# Patient Record
Sex: Female | Born: 1940
Health system: Southern US, Community
[De-identification: ages and names within clinical notes are randomized; demographics above are authoritative.]

## PROBLEM LIST (undated history)

## (undated) DIAGNOSIS — M8580 Other specified disorders of bone density and structure, unspecified site: Secondary | ICD-10-CM

## (undated) DIAGNOSIS — J309 Allergic rhinitis, unspecified: Secondary | ICD-10-CM

## (undated) DIAGNOSIS — E7800 Pure hypercholesterolemia, unspecified: Secondary | ICD-10-CM

## (undated) DIAGNOSIS — H269 Unspecified cataract: Secondary | ICD-10-CM

## (undated) DIAGNOSIS — Z9889 Other specified postprocedural states: Secondary | ICD-10-CM

## (undated) DIAGNOSIS — R112 Nausea with vomiting, unspecified: Secondary | ICD-10-CM

## (undated) DIAGNOSIS — K219 Gastro-esophageal reflux disease without esophagitis: Secondary | ICD-10-CM

## (undated) DIAGNOSIS — B029 Zoster without complications: Secondary | ICD-10-CM

## (undated) DIAGNOSIS — D279 Benign neoplasm of unspecified ovary: Secondary | ICD-10-CM

## (undated) DIAGNOSIS — M1990 Unspecified osteoarthritis, unspecified site: Secondary | ICD-10-CM

## (undated) HISTORY — DX: Allergic rhinitis, unspecified: J30.9

## (undated) HISTORY — DX: Benign neoplasm of unspecified ovary: D27.9

## (undated) HISTORY — PX: TONSILLECTOMY: SUR1361

## (undated) HISTORY — PX: EYE SURGERY: SHX253

## (undated) HISTORY — DX: Other specified disorders of bone density and structure, unspecified site: M85.80

## (undated) HISTORY — PX: FRACTURE SURGERY: SHX138

## (undated) HISTORY — DX: Zoster without complications: B02.9

## (undated) HISTORY — DX: Unspecified cataract: H26.9

## (undated) HISTORY — DX: Pure hypercholesterolemia, unspecified: E78.00

---

## 2005-02-01 ENCOUNTER — Other Ambulatory Visit: Admission: RE | Admit: 2005-02-01 | Discharge: 2005-02-01 | Payer: Self-pay | Admitting: Obstetrics and Gynecology

## 2005-02-14 ENCOUNTER — Ambulatory Visit: Admission: RE | Admit: 2005-02-14 | Discharge: 2005-02-14 | Payer: Self-pay | Admitting: Gynecologic Oncology

## 2005-02-17 HISTORY — PX: TOTAL ABDOMINAL HYSTERECTOMY W/ BILATERAL SALPINGOOPHORECTOMY: SHX83

## 2005-02-17 HISTORY — PX: TOTAL ABDOMINAL HYSTERECTOMY: SHX209

## 2005-03-07 ENCOUNTER — Inpatient Hospital Stay (HOSPITAL_COMMUNITY): Admission: RE | Admit: 2005-03-07 | Discharge: 2005-03-09 | Payer: Self-pay | Admitting: Obstetrics & Gynecology

## 2005-04-18 ENCOUNTER — Ambulatory Visit: Admission: RE | Admit: 2005-04-18 | Discharge: 2005-04-18 | Payer: Self-pay | Admitting: Gynecology

## 2008-09-02 LAB — HM COLONOSCOPY

## 2008-10-27 LAB — HM DEXA SCAN

## 2011-11-28 DIAGNOSIS — Z1231 Encounter for screening mammogram for malignant neoplasm of breast: Secondary | ICD-10-CM | POA: Diagnosis not present

## 2011-11-28 LAB — HM MAMMOGRAPHY: HM Mammogram: NEGATIVE

## 2011-11-29 ENCOUNTER — Encounter: Payer: Self-pay | Admitting: Internal Medicine

## 2011-12-25 ENCOUNTER — Encounter: Payer: Self-pay | Admitting: Family Medicine

## 2012-01-04 ENCOUNTER — Ambulatory Visit (INDEPENDENT_AMBULATORY_CARE_PROVIDER_SITE_OTHER): Payer: Medicare Other | Admitting: Family Medicine

## 2012-01-04 ENCOUNTER — Encounter: Payer: Self-pay | Admitting: Family Medicine

## 2012-01-04 VITALS — BP 130/76 | HR 60 | Ht 69.0 in | Wt 170.0 lb

## 2012-01-04 DIAGNOSIS — R030 Elevated blood-pressure reading, without diagnosis of hypertension: Secondary | ICD-10-CM

## 2012-01-04 DIAGNOSIS — M8580 Other specified disorders of bone density and structure, unspecified site: Secondary | ICD-10-CM

## 2012-01-04 DIAGNOSIS — Z23 Encounter for immunization: Secondary | ICD-10-CM

## 2012-01-04 DIAGNOSIS — R5381 Other malaise: Secondary | ICD-10-CM | POA: Diagnosis not present

## 2012-01-04 DIAGNOSIS — R319 Hematuria, unspecified: Secondary | ICD-10-CM

## 2012-01-04 DIAGNOSIS — M899 Disorder of bone, unspecified: Secondary | ICD-10-CM

## 2012-01-04 DIAGNOSIS — Z0000 Encounter for general adult medical examination without abnormal findings: Secondary | ICD-10-CM

## 2012-01-04 DIAGNOSIS — R5383 Other fatigue: Secondary | ICD-10-CM

## 2012-01-04 LAB — POCT URINALYSIS DIPSTICK
Bilirubin, UA: NEGATIVE
Nitrite, UA: NEGATIVE
Urobilinogen, UA: NEGATIVE
pH, UA: 8

## 2012-01-04 LAB — CBC WITH DIFFERENTIAL/PLATELET
Basophils Absolute: 0 10*3/uL (ref 0.0–0.1)
Basophils Relative: 1 % (ref 0–1)
Eosinophils Absolute: 0.3 10*3/uL (ref 0.0–0.7)
Eosinophils Relative: 3 % (ref 0–5)
HCT: 38.9 % (ref 36.0–46.0)
Hemoglobin: 13.1 g/dL (ref 12.0–15.0)
MCH: 29.4 pg (ref 26.0–34.0)
MCHC: 33.7 g/dL (ref 30.0–36.0)
MCV: 87.4 fL (ref 78.0–100.0)
Monocytes Absolute: 0.8 10*3/uL (ref 0.1–1.0)
Monocytes Relative: 9 % (ref 3–12)
Neutro Abs: 5.1 10*3/uL (ref 1.7–7.7)
RDW: 13.6 % (ref 11.5–15.5)

## 2012-01-04 LAB — COMPREHENSIVE METABOLIC PANEL
AST: 14 U/L (ref 0–37)
Albumin: 4.3 g/dL (ref 3.5–5.2)
Alkaline Phosphatase: 48 U/L (ref 39–117)
BUN: 13 mg/dL (ref 6–23)
Creat: 0.67 mg/dL (ref 0.50–1.10)
Glucose, Bld: 90 mg/dL (ref 70–99)
Total Bilirubin: 0.5 mg/dL (ref 0.3–1.2)

## 2012-01-04 NOTE — Patient Instructions (Addendum)
HEALTH MAINTENANCE RECOMMENDATIONS:  It is recommended that you get at least 30 minutes of aerobic exercise at least 5 days/week (for weight loss, you may need as much as 60-90 minutes). This can be any activity that gets your heart rate up. This can be divided in 10-15 minute intervals if needed, but try and build up your endurance at least once a week.  Weight bearing exercise is also recommended twice weekly.  Eat a healthy diet with lots of vegetables, fruits and fiber.  "Colorful" foods have a lot of vitamins (ie green vegetables, tomatoes, red peppers, etc).  Limit sweet tea, regular sodas and alcoholic beverages, all of which has a lot of calories and sugar.  Up to 1 alcoholic drink daily may be beneficial for women (unless trying to lose weight, watch sugars).  Drink a lot of water.  Calcium recommendations are 1200-1500 mg daily (1500 mg for postmenopausal women or women without ovaries), and vitamin D 1000 IU daily.  This should be obtained from diet and/or supplements (vitamins), and calcium should not be taken all at once, but in divided doses.  Monthly self breast exams and yearly mammograms for women over the age of 73 is recommended.  Sunscreen of at least SPF 30 should be used on all sun-exposed parts of the skin when outside between the hours of 10 am and 4 pm (not just when at beach or pool, but even with exercise, golf, tennis, and yard work!)  Use a sunscreen that says "broad spectrum" so it covers both UVA and UVB rays, and make sure to reapply every 1-2 hours.  Remember to change the batteries in your smoke detectors when changing your clock times in the spring and fall.  Use your seat belt every time you are in a car, and please drive safely and not be distracted with cell phones and texting while driving.  I believe you need to get your shingles vaccine from a pharmacy--prescription was given to you.  Check with your insurance regarding coverage and where to get.  Please set  up dental visit and eye exam.  Diet for Gastroesophageal Reflux Disease, Adult Reflux (acid reflux) is when acid from your stomach flows up into the esophagus. When acid comes in contact with the esophagus, the acid causes irritation and soreness (inflammation) in the esophagus. When reflux happens often or so severely that it causes damage to the esophagus, it is called gastroesophageal reflux disease (GERD). Nutrition therapy can help ease the discomfort of GERD. FOODS OR DRINKS TO AVOID OR LIMIT  Smoking or chewing tobacco. Nicotine is one of the most potent stimulants to acid production in the gastrointestinal tract.  Caffeinated and decaffeinated coffee and black tea.  Regular or low-calorie carbonated beverages or energy drinks (caffeine-free carbonated beverages are allowed).   Strong spices, such as black pepper, white pepper, red pepper, cayenne, curry powder, and chili powder.  Peppermint or spearmint.  Chocolate.  High-fat foods, including meats and fried foods. Extra added fats including oils, butter, salad dressings, and nuts. Limit these to less than 8 tsp per day.  Fruits and vegetables if they are not tolerated, such as citrus fruits or tomatoes.  Alcohol.  Any food that seems to aggravate your condition. If you have questions regarding your diet, call your caregiver or a registered dietitian. OTHER THINGS THAT MAY HELP GERD INCLUDE:   Eating your meals slowly, in a relaxed setting.  Eating 5 to 6 small meals per day instead of 3 large meals.  Eliminating food for a period of time if it causes distress.  Not lying down until 3 hours after eating a meal.  Keeping the head of your bed raised 6 to 9 inches (15 to 23 cm) by using a foam wedge or blocks under the legs of the bed. Lying flat may make symptoms worse.  Being physically active. Weight loss may be helpful in reducing reflux in overweight or obese adults.  Wear loose fitting clothing EXAMPLE MEAL  PLAN This meal plan is approximately 2,000 calories based on https://www.bernard.org/ meal planning guidelines. Breakfast   cup cooked oatmeal.  1 cup strawberries.  1 cup low-fat milk.  1 oz almonds. Snack  1 cup cucumber slices.  6 oz yogurt (made from low-fat or fat-free milk). Lunch  2 slice whole-wheat bread.  2 oz sliced [place].  2 tsp mayonnaise.  1 cup blueberries.  1 cup snap peas. Snack  6 whole-wheat crackers.  1 oz string cheese. Dinner   cup brown rice.  1 cup mixed veggies.  1 tsp olive oil.  3 oz grilled fish. Document Released: 03/06/2005 Document Revised: 05/29/2011 Document Reviewed: 01/20/2011 [facility] Patient Information 2013 [place], [place].

## 2012-01-04 NOTE — Progress Notes (Signed)
Chief Complaint  Patient presents with  . Annual Exam    annual wellnes exam, pt is fasting. Former patient. Unsure if she would like flu shot. Would like shingles vaccine if possible. Also a few weeks begore retiring she found some blood in her urine.   Joan Morales is a 71 y.o. female who presents to re-establish care, for a complete physical and annual wellness exam.  She has the following concerns:  In July, she had one incidence where she noted blood in the commode after voiding.  There was not associated back pain, abdominal pain, no dysuria, urgency and frequency.  Has had no further blood noted since then. Denies vaginal discharge, spotting, or other concerns.  Health Maintenance: Immunization History  Administered Date(s) Administered  . Pneumococcal Polysaccharide 09/17/2008  . Tdap 04/20/2005   Last Pap smear: s/p hysterectomy Last mammogram: 11/2011 Last colonoscopy: 2010 Last DEXA: 05/2005, T-2.0 at [facility] Dentist: years ago Ophtho: years ago Exercise: None  AWV: Has no other physicians that she sees. Has healthcare power of attorney and living will. See depression and FAST questionnaires in chart (normal).  Past Medical History  Diagnosis Date  . Allergic rhinitis, cause unspecified   . Ovarian teratoma     left, removed 12/06    Past Surgical History  Procedure Date  . Total abdominal hysterectomy w/ bilateral salpingoophorectomy 12/06    L ovarian teratoma  . Tonsillectomy child    History   Social History  . Marital Status: Married    Spouse Name: N/A    Number of Children: 1  . Years of Education: N/A   Occupational History  . retired    Social History Main Topics  . Smoking status: Never Smoker   . Smokeless tobacco: Never Used  . Alcohol Use: No  . Drug Use: No  . Sexually Active: Not Currently -- Female partner(s)     husband with impotence issues   Other Topics Concern  . Not on file   Social History Narrative   Married, lives with  husband.  Son lives in [place], [place].  2 grandchildren.  Retired recently Administrator, [employer]).    Family History  Problem Relation Age of Onset  . Heart disease Mother   . COPD Father   . Fibromyalgia Sister   . Diabetes Paternal Aunt   . Breast cancer Maternal Grandmother     50's    Current outpatient prescriptions:Ascorbic Acid (VITAMIN C) 1000 MG tablet, Take 1,000 mg by mouth daily., Disp: , Rfl: ;  Calcium Carbonate-Vitamin D (RA CALCIUM PLUS VITAMIN D) 600-400 MG-UNIT per tablet, Take 2 tablets by mouth daily., Disp: , Rfl: ;  Glucosamine HCl 1000 MG TABS, Take 1,000 mg by mouth 2 (two) times daily., Disp: , Rfl: ;  Omega-3 Fatty Acids (FISH OIL) 1200 MG CAPS, Take 2,400 mg by mouth daily., Disp: , Rfl:  vitamin E 400 UNIT capsule, Take 400 Units by mouth daily., Disp: , Rfl:   Allergies  Allergen Reactions  . Erythromycin Swelling   ROS: The patient denies anorexia, fever, weight changes, headaches,  vision changes (wears glasses with driving--had trouble seeing chart today though), decreased hearing, ear pain, sore throat, breast concerns, chest pain, palpitations, dizziness, syncope, dyspnea on exertion, cough, swelling, nausea, vomiting, diarrhea, constipation, abdominal pain, melena, hematochezia, incontinence, dysuria, vaginal bleeding, discharge, odor or itch, genital lesions, joint pains, numbness, tingling, weakness, tremor, suspicious skin lesions, depression, anxiety, abnormal bleeding/bruising, or enlarged lymph nodes. Mild seasonal allergies, relieved by  chlortrimeton as needed. Daily heartburn, uses pepcid as needed.  PHYSICAL EXAM: BP 130/76  Pulse 60  Ht 5\' 9"  (1.753 m)  Wt 170 lb (77.111 kg)  BMI 25.10 kg/m2 (initial BP was 148/80) General Appearance:    Alert, cooperative, no distress, appears stated age  Head:    Normocephalic, without obvious abnormality, atraumatic  Eyes:    PERRL, conjunctiva/corneas clear, EOM's intact, fundi    benign  Ears:    Normal  TM's and external ear canals  Nose:   Nares normal, mucosa mild-mod edematousl, no drainage or sinus tenderness  Throat:   Lips, mucosa, and tongue normal; teeth and gums normal  Neck:   Supple, no lymphadenopathy;  thyroid:  no   enlargement/tenderness/nodules; no carotid   bruit or JVD  Back:    Spine nontender, no curvature, ROM normal, no CVA     tenderness  Lungs:     Clear to auscultation bilaterally without wheezes, rales or     ronchi; respirations unlabored  Chest Wall:    No tenderness or deformity   Heart:    Regular rate and rhythm, S1 and S2 normal, no murmur, rub   or gallop  Breast Exam:    No tenderness, masses, or nipple discharge or inversion.      No axillary lymphadenopathy  Abdomen:     Soft, non-tender, nondistended, normoactive bowel sounds,    no masses, no hepatosplenomegaly  Genitalia:    Normal external genitalia without lesions, mild atrophic changes.  BUS and vagina normal; bimanual exam normal--no pelvic masses appreciable, no tenderness. Pap not  performed  Rectal:    Normal tone, no masses or tenderness; guaiac negative stool  Extremities:   No clubbing, cyanosis or edema. Bilateral bunions, hammertoes and callouses.  Pulses:   2+ and symmetric all extremities  Skin:   Skin color, texture, turgor normal, no rashes or lesions  Lymph nodes:   Cervical, supraclavicular, and axillary nodes normal  Neurologic:   CNII-XII intact, normal strength, sensation and gait; reflexes 2+ and symmetric throughout          Psych:   Normal mood, affect, hygiene and grooming.     ASSESSMENT/PLAN: 1. Medicare annual wellness visit, initial  POCT Urinalysis Dipstick, Visual acuity screening  2. Hematuria  Urine culture, CBC with Differential, Comprehensive metabolic panel  3. Other malaise and fatigue  TSH  4. Elevated BP  Comprehensive metabolic panel   improved on repeat testing  5. Osteopenia     Osteopenia--due for DEXA at [facility]  Episode of hematuria in July.  Trace  blood and 1+ leuks on dip--send for culture.  Asymptomatic now.  GERD--diet and precautions reviewed.  She doesn't want to take meds.  Discussed how to take meds prn, PRIOR to the spicy meal.  AWV--reviewed code status (full code, doesn't desire prolonged IV fluids or nutrition).  End of life issues reviewed.  See copy of handout given patient, regarding covered services.  Discussed monthly self breast exams and yearly mammograms after the age of 66; at least 30 minutes of aerobic activity at least 5 days/week; proper sunscreen use reviewed; healthy diet, including goals of calcium and vitamin D intake and alcohol recommendations (less than or equal to 1 drink/day) reviewed; regular seatbelt use; changing batteries in smoke detectors.  Immunization recommendations discussed (see below).  Colonoscopy recommendations reviewed, UTD  Encouraged dental and ophtho exams and discussed why these are important for her to schedule.  zostavax prescription given.  Risks/benefits reviewed. Flu  shot recommended, declined

## 2012-01-05 ENCOUNTER — Encounter: Payer: Self-pay | Admitting: Family Medicine

## 2012-01-07 LAB — URINE CULTURE

## 2012-01-08 ENCOUNTER — Other Ambulatory Visit: Payer: Self-pay | Admitting: *Deleted

## 2012-01-08 MED ORDER — NITROFURANTOIN MONOHYD MACRO 100 MG PO CAPS: 100.0000 mg | ORAL_CAPSULE | Freq: Two times a day (BID) | ORAL | Status: DC

## 2012-01-29 ENCOUNTER — Encounter: Payer: Self-pay | Admitting: *Deleted

## 2012-01-29 DIAGNOSIS — M810 Age-related osteoporosis without current pathological fracture: Secondary | ICD-10-CM | POA: Diagnosis not present

## 2012-01-29 LAB — HM DEXA SCAN

## 2012-02-07 ENCOUNTER — Encounter: Payer: Self-pay | Admitting: Family Medicine

## 2012-02-26 ENCOUNTER — Ambulatory Visit (INDEPENDENT_AMBULATORY_CARE_PROVIDER_SITE_OTHER): Payer: Medicare Other | Admitting: Family Medicine

## 2012-02-26 ENCOUNTER — Encounter: Payer: Self-pay | Admitting: Family Medicine

## 2012-02-26 VITALS — BP 128/70 | HR 72 | Ht 69.0 in | Wt 178.0 lb

## 2012-02-26 DIAGNOSIS — M949 Disorder of cartilage, unspecified: Secondary | ICD-10-CM | POA: Diagnosis not present

## 2012-02-26 DIAGNOSIS — M8580 Other specified disorders of bone density and structure, unspecified site: Secondary | ICD-10-CM

## 2012-02-26 MED ORDER — INFLUENZA VIRUS VACC SPLIT PF IM SUSP: 0.5000 mL | Freq: Once | INTRAMUSCULAR | Status: DC

## 2012-02-26 MED ORDER — ALENDRONATE SODIUM 70 MG PO TABS: 70.0000 mg | ORAL_TABLET | ORAL | Status: DC

## 2012-02-26 NOTE — Patient Instructions (Addendum)
Take alendronate once weekly as directed.  Call if you develop chest pain, trouble swallowing, jaw pain, or other problems or side effects.  Continue to take your calcium and vitamin D, and get regular weight-bearing exercise  Osteoporosis Throughout your life, your body breaks down old bone and replaces it with new bone. As you get older, your body does not replace bone as quickly as it breaks it down. By the age of 30 years, most people begin to gradually lose bone because of the imbalance between bone loss and replacement. Some people lose more bone than others. Bone loss beyond a specified normal degree is considered osteoporosis.  Osteoporosis affects the strength and durability of your bones. The inside of the ends of your bones and your flat bones, like the bones of your pelvis, look like honeycomb, filled with tiny open spaces. As bone loss occurs, your bones become less dense. This means that the open spaces inside your bones become bigger and the walls between these spaces become thinner. This makes your bones weaker. Bones of a person with osteoporosis can become so weak that they can break (fracture) during minor accidents, such as a simple fall. CAUSES  The following factors have been associated with the development of osteoporosis:  Smoking.  Drinking more than 2 alcoholic drinks several days per week.  Long-term use of certain medicines:  Corticosteroids.  Chemotherapy medicines.  Thyroid medicines.  Antiepileptic medicines.  Gonadal hormone suppression medicine.  Immunosuppression medicine.  Being underweight.  Lack of physical activity.  Lack of exposure to the sun. This can lead to vitamin D deficiency.  Certain medical conditions:  Certain inflammatory bowel diseases, such as Crohn's disease and ulcerative colitis.  Diabetes.  Hyperthyroidism.  Hyperparathyroidism. RISK FACTORS Anyone can develop osteoporosis. However, the following factors can increase  your risk of developing osteoporosis:  Gender Women are at higher risk than men.  Age Being older than 50 years increases your risk.  Ethnicity White and Asian people have an increased risk.  Weight Being extremely underweight can increase your risk of osteoporosis.  Family history of osteoporosis Having a family member who has developed osteoporosis can increase your risk. SYMPTOMS  Usually, people with osteoporosis have no symptoms.  DIAGNOSIS  Signs during a physical exam that may prompt your caregiver to suspect osteoporosis include:  Decreased height. This is usually caused by the compression of the bones that form your spine (vertebrae) because they have weakened and become fractured.  A curving or rounding of the upper back (kyphosis). To confirm signs of osteoporosis, your caregiver may request a procedure that uses 2 low-dose X-ray beams with different levels of energy to measure your bone mineral density (dual-energy X-ray absorptiometry [DXA]). Also, your caregiver may check your level of vitamin D. TREATMENT  The goal of osteoporosis treatment is to strengthen bones in order to decrease the risk of bone fractures. There are different types of medicines available to help achieve this goal. Some of these medicines work by slowing the processes of bone loss. Some medicines work by increasing bone density. Treatment also involves making sure that your levels of calcium and vitamin D are adequate. PREVENTION  There are things you can do to help prevent osteoporosis. Adequate intake of calcium and vitamin D can help you achieve optimal bone mineral density. Regular exercise can also help, especially resistance and high-impact activities. If you smoke, quitting smoking is an important part of osteoporosis prevention. MAKE SURE YOU:  Understand these instructions.  Will watch  your condition.  Will get help right away if you are not doing well or get worse. Document Released:  12/14/2004 Document Revised: 05/29/2011 Document Reviewed: 02/18/2011 [facility] Patient Information 2013 [place], [place].

## 2012-02-26 NOTE — Addendum Note (Signed)
Addended by: Debbrah Alar F on: 02/26/2012 01:42 PM   Modules accepted: Orders

## 2012-02-26 NOTE — Progress Notes (Signed)
Chief Complaint  Patient presents with  . Follow-up    appt to discuss DEXA results.   HPI:  Patient recently had DEXA scan, showing T-2.4 with elevated FRAX score, putting her at increased risk for fracture (for both hip, and major osteoporotic fracture).  Chart was reviewed, and there was a decline in bone density in hip from prior study.  She had a normal vitamin D level in 2010, and supplementation has remained the same since that visit.  Past Medical History  Diagnosis Date  . Allergic rhinitis, cause unspecified   . Ovarian teratoma     left, removed 12/06  . Osteopenia   . Pure hypercholesterolemia    Past Surgical History  Procedure Date  . Total abdominal hysterectomy w/ bilateral salpingoophorectomy 12/06    L ovarian teratoma  . Tonsillectomy child   History   Social History  . Marital Status: Married    Spouse Name: N/A    Number of Children: 1  . Years of Education: N/A   Occupational History  . retired    Social History Main Topics  . Smoking status: Never Smoker   . Smokeless tobacco: Never Used  . Alcohol Use: No  . Drug Use: No  . Sexually Active: Not Currently -- Female partner(s)     Comment: husband with impotence issues   Other Topics Concern  . Not on file   Social History Narrative   Married, lives with husband.  Son lives in [place], [place].  2 grandchildren.  Retired recently Administrator, [employer]).   Current Outpatient Prescriptions on File Prior to Visit  Medication Sig Dispense Refill  . Ascorbic Acid (VITAMIN C) 1000 MG tablet Take 1,000 mg by mouth daily.      . Calcium Carbonate-Vitamin D (RA CALCIUM PLUS VITAMIN D) 600-400 MG-UNIT per tablet Take 2 tablets by mouth daily.      . Glucosamine HCl 1000 MG TABS Take 1,000 mg by mouth 2 (two) times daily.      . Omega-3 Fatty Acids (FISH OIL) 1200 MG CAPS Take 2,400 mg by mouth daily.      . vitamin E 400 UNIT capsule Take 400 Units by mouth daily.      . influenza  inactive virus vaccine  (FLUZONE/FLUARIX) injection Inject 0.5 mLs into the muscle once.  0.25 mL  0    Allergies  Allergen Reactions  . Erythromycin Swelling   ROS:  Denies dysphagia, chest pain, reflux, jaw pain.  Denies hot flashes, leg cramps.  Denies back/hip pain.  Denies fevers, URI symptoms, chest pain, shortness of breath, cough, bleeding/bruising or other concerns.  PHYSICAL EXAM: BP 128/70  Pulse 72  Ht 5\' 9"  (1.753 m)  Wt 178 lb (80.74 kg)  BMI 26.29 kg/m2 Well developed, pleasant female in no distress Remainder of visit was limited to discussion  ASSESSMENT/PLAN: 1. Osteopenia  alendronate (FOSAMAX) 70 MG tablet    Risks and side effects of bisphosphonates, Evista and miacalcin reviewed.  Start with alendronate.  If causing GI side effects which last a few days, consider changing to monthly.  Continue calcium and vitamin D supplementation, as well as weight-bearing exercise.  Repeat DEXA 2 years.  Return sooner if having side effect/problems with medications. All questions were answered.  Visit was 25 minutes, all of which was spent counseling, and reviewing results

## 2012-10-25 ENCOUNTER — Encounter: Payer: Self-pay | Admitting: Medical

## 2012-10-25 ENCOUNTER — Ambulatory Visit (INDEPENDENT_AMBULATORY_CARE_PROVIDER_SITE_OTHER): Payer: Medicare Other | Admitting: Medical

## 2012-10-25 VITALS — BP 130/80 | HR 64 | Temp 98.1°F | Resp 16 | Wt 166.0 lb

## 2012-10-25 DIAGNOSIS — R300 Dysuria: Secondary | ICD-10-CM | POA: Diagnosis not present

## 2012-10-25 DIAGNOSIS — N390 Urinary tract infection, site not specified: Secondary | ICD-10-CM | POA: Diagnosis not present

## 2012-10-25 LAB — POCT URINALYSIS DIPSTICK
Bilirubin, UA: NEGATIVE
Glucose, UA: NEGATIVE
Nitrite, UA: POSITIVE
Urobilinogen, UA: NEGATIVE

## 2012-10-25 MED ORDER — NITROFURANTOIN MONOHYD MACRO 100 MG PO CAPS: 100.0000 mg | ORAL_CAPSULE | Freq: Two times a day (BID) | ORAL | Status: DC

## 2012-10-25 NOTE — Progress Notes (Signed)
Subjective  Joan Morales is a 72 y.o. female who complains of pain in the lower abdomen. She has had symptoms for 1 day. Patient also complains of urgency, pelvic pain. Patient denies back pain, fever, vaginal discharge and denies hematuria, cloudy or odorous urine. Patient does not have a history of recurrent UTI.  Has had some UTI in the past.  Patient does not have a history of pyelonephritis.   ROS as in subjective   Objective:    Filed Vitals:   10/25/12 1131  BP: 130/80  Pulse: 64  Temp: 98.1 F (36.7 C)  Resp: 16    General appearance: alert, no distress, WD/WN, female Abdomen: +bs, soft, non tender, non distended, no masses, no hepatomegaly, no splenomegaly Back: no CVA tenderness     Assessment and Plan:   Encounter Diagnoses  Name Primary?  . UTI (urinary tract infection) Yes  . Dysuria    Given the urinalysis results, will begin Macrobid empirically.   Urine culture sent.   Reviewed urine culture from 12/2011 which showed sensitive to macrobid, but resistant to cipro and bactrim.  F/u pending culture.   Discussed hydration, hygiene.

## 2012-10-25 NOTE — Patient Instructions (Signed)
Urinary Tract Infection  Urinary tract infections (UTIs) can develop anywhere along your urinary tract. Your urinary tract is your body's drainage system for removing wastes and extra water. Your urinary tract includes two kidneys, two ureters, a bladder, and a urethra. Your kidneys are a pair of bean-shaped organs. Each kidney is about the size of your fist. They are located below your ribs, one on each side of your spine.  CAUSES  Infections are caused by microbes, which are microscopic organisms, including fungi, viruses, and bacteria. These organisms are so small that they can only be seen through a microscope. Bacteria are the microbes that most commonly cause UTIs.  SYMPTOMS   Symptoms of UTIs may vary by age and gender of the patient and by the location of the infection. Symptoms in young women typically include a frequent and intense urge to urinate and a painful, burning feeling in the bladder or urethra during urination. Older women and men are more likely to be tired, shaky, and weak and have muscle aches and abdominal pain. A fever may mean the infection is in your kidneys. Other symptoms of a kidney infection include pain in your back or sides below the ribs, nausea, and vomiting.  DIAGNOSIS  To diagnose a UTI, your caregiver will ask you about your symptoms. Your caregiver also will ask to provide a urine sample. The urine sample will be tested for bacteria and white blood cells. White blood cells are made by your body to help fight infection.  TREATMENT   Typically, UTIs can be treated with medication. Because most UTIs are caused by a bacterial infection, they usually can be treated with the use of antibiotics. The choice of antibiotic and length of treatment depend on your symptoms and the type of bacteria causing your infection.  HOME CARE INSTRUCTIONS   If you were prescribed antibiotics, take them exactly as your caregiver instructs you. Finish the medication even if you feel better after you  have only taken some of the medication.   Drink enough water and fluids to keep your urine clear or pale yellow.   Avoid caffeine, tea, and carbonated beverages. They tend to irritate your bladder.   Empty your bladder often. Avoid holding urine for long periods of time.   Empty your bladder before and after sexual intercourse.   After a bowel movement, women should cleanse from front to back. Use each tissue only once.  SEEK MEDICAL CARE IF:    You have back pain.   You develop a fever.   Your symptoms do not begin to resolve within 3 days.  SEEK IMMEDIATE MEDICAL CARE IF:    You have severe back pain or lower abdominal pain.   You develop chills.   You have nausea or vomiting.   You have continued burning or discomfort with urination.  MAKE SURE YOU:    Understand these instructions.   Will watch your condition.   Will get help right away if you are not doing well or get worse.  Document Released: 12/14/2004 Document Revised: 09/05/2011 Document Reviewed: 04/14/2011  [employer] Patient Information 2014 [employer].

## 2012-10-29 LAB — URINE CULTURE: Colony Count: >=100000

## 2013-01-15 ENCOUNTER — Telehealth: Payer: Self-pay | Admitting: Family Medicine

## 2013-01-15 ENCOUNTER — Other Ambulatory Visit: Payer: Self-pay | Admitting: *Deleted

## 2013-01-15 DIAGNOSIS — M8580 Other specified disorders of bone density and structure, unspecified site: Secondary | ICD-10-CM

## 2013-01-15 MED ORDER — ALENDRONATE SODIUM 70 MG PO TABS: 70.0000 mg | ORAL_TABLET | ORAL | Status: DC

## 2013-01-15 NOTE — Telephone Encounter (Signed)
Pt called and scheduled a cpe needs refill on fosamax sent to [facility] on [address]. She has two pills left but will need before cpe.

## 2013-02-12 NOTE — Telephone Encounter (Signed)
02/12/2013 °

## 2013-04-29 ENCOUNTER — Ambulatory Visit (INDEPENDENT_AMBULATORY_CARE_PROVIDER_SITE_OTHER): Payer: Medicare Other | Admitting: Medical

## 2013-04-29 ENCOUNTER — Encounter: Payer: Self-pay | Admitting: Medical

## 2013-04-29 VITALS — BP 130/80 | HR 58 | Temp 98.7°F | Resp 14 | Wt 165.0 lb

## 2013-04-29 DIAGNOSIS — R05 Cough: Secondary | ICD-10-CM

## 2013-04-29 DIAGNOSIS — R059 Cough, unspecified: Secondary | ICD-10-CM | POA: Diagnosis not present

## 2013-04-29 DIAGNOSIS — B9789 Other viral agents as the cause of diseases classified elsewhere: Secondary | ICD-10-CM

## 2013-04-29 DIAGNOSIS — J988 Other specified respiratory disorders: Secondary | ICD-10-CM

## 2013-04-29 NOTE — Progress Notes (Signed)
Subjective:  Joan Morales is a 73 y.o. female who presents for illness.  She reports 3 day history of cough, chest congestion, some nasal drainage, mild sore throat, some phlegm with cough. Denies nausea, vomiting, shortness of breath, wheezing, ear pain, fever, sinus pressure, ear pain.  Treatment to date: OTC Tussin for cough..  Denies sick contacts.  No other aggravating or relieving factors.  No other c/o.  ROS as in subjective.   Objective: Filed Vitals:   04/29/13 1438  BP: 130/80  Pulse: 58  Temp: 98.7 F (37.1 C)  Resp: 14    General appearance: Alert, WD/WN, no distress, mildly ill appearing                             Skin: warm, no rash                           Head: no sinus tenderness                            Eyes: conjunctiva normal, corneas clear, PERRLA                            Ears: pearly TMs, external ear canals normal                          Nose: septum midline, turbinates swollen, with erythema and clear discharge             Mouth/throat: MMM, tongue normal, mild pharyngeal erythema                           Neck: supple, no adenopathy, no thyromegaly, nontender                          Heart: RRR, normal S1, S2, no murmurs                         Lungs: +rhonchi, rattly somewhat, but good effort, no dullness, no wheezing     Assessment: Encounter Diagnoses  Name Primary?  . Cough Yes  . Viral respiratory infection      Plan: At this point symptoms and exam suggest viral respiratory infection . Discussed diagnosis and treatment of URI.  Suggested symptomatic OTC remedies. Nasal saline spray for congestion.  Tylenol or Ibuprofen OTC for fever and malaise.  However, advised if fever, worse symptoms in the next 2-3 days, then call or return . She is agreeable to this plan.

## 2013-04-30 ENCOUNTER — Telehealth: Payer: Self-pay | Admitting: Medical

## 2013-04-30 NOTE — Telephone Encounter (Signed)
Please call  She is worse today, fever 101.2., throat worse. Wants to know what she needs to do or take

## 2013-04-30 NOTE — Telephone Encounter (Signed)
pls find out what her symptoms are today?  Mostly cough and congestion the day I saw her.  Please verify what new or worse symptoms she is having?

## 2013-05-01 NOTE — Telephone Encounter (Signed)
Pt called and stated that her cough is worse and she is now running a fever. Pt uses [facility] on [address].

## 2013-05-02 ENCOUNTER — Other Ambulatory Visit: Payer: Self-pay | Admitting: Medical

## 2013-05-02 ENCOUNTER — Telehealth: Payer: Self-pay | Admitting: Medical

## 2013-05-02 MED ORDER — LEVOFLOXACIN 500 MG PO TABS
500.0000 mg | ORAL_TABLET | Freq: Every day | ORAL | Status: DC
Start: 2013-05-02 — End: 2013-06-04

## 2013-05-02 MED ORDER — BENZONATATE 200 MG PO CAPS: 200.0000 mg | ORAL_CAPSULE | Freq: Three times a day (TID) | ORAL | Status: DC | PRN

## 2013-05-02 NOTE — Telephone Encounter (Signed)
lm

## 2013-05-02 NOTE — Telephone Encounter (Signed)
Begin Levaquin once daily antibiotic, Tessalon Perles for cough prn, use OTC Mucinex DM for cough congestion too, but don't take at the same time as [employer].   The Perles are just for worse cough if needed such as bedtime or coughing spells.  Make sure she is drinking plenty of water.

## 2013-06-04 ENCOUNTER — Encounter: Payer: Self-pay | Admitting: Family Medicine

## 2013-06-04 ENCOUNTER — Ambulatory Visit (INDEPENDENT_AMBULATORY_CARE_PROVIDER_SITE_OTHER): Payer: Medicare Other | Admitting: Family Medicine

## 2013-06-04 VITALS — BP 130/82 | HR 68 | Ht 69.75 in | Wt 164.0 lb

## 2013-06-04 DIAGNOSIS — Z01419 Encounter for gynecological examination (general) (routine) without abnormal findings: Secondary | ICD-10-CM

## 2013-06-04 DIAGNOSIS — M2040 Other hammer toe(s) (acquired), unspecified foot: Secondary | ICD-10-CM

## 2013-06-04 DIAGNOSIS — M21619 Bunion of unspecified foot: Secondary | ICD-10-CM

## 2013-06-04 DIAGNOSIS — E7800 Pure hypercholesterolemia, unspecified: Secondary | ICD-10-CM | POA: Diagnosis not present

## 2013-06-04 DIAGNOSIS — Z0000 Encounter for general adult medical examination without abnormal findings: Secondary | ICD-10-CM | POA: Diagnosis not present

## 2013-06-04 DIAGNOSIS — Z13220 Encounter for screening for lipoid disorders: Secondary | ICD-10-CM

## 2013-06-04 DIAGNOSIS — Z131 Encounter for screening for diabetes mellitus: Secondary | ICD-10-CM | POA: Diagnosis not present

## 2013-06-04 DIAGNOSIS — Z23 Encounter for immunization: Secondary | ICD-10-CM | POA: Diagnosis not present

## 2013-06-04 DIAGNOSIS — M8580 Other specified disorders of bone density and structure, unspecified site: Secondary | ICD-10-CM

## 2013-06-04 LAB — LIPID PANEL
CHOL/HDL RATIO: 3 ratio
CHOLESTEROL: 233 mg/dL — AB (ref 0–200)
HDL: 78 mg/dL (ref >39)
LDL Cholesterol: 137 mg/dL — ABNORMAL HIGH (ref 0–99)
Triglycerides: 90 mg/dL (ref <150)
VLDL: 18 mg/dL (ref 0–40)

## 2013-06-04 LAB — GLUCOSE, RANDOM: Glucose, Bld: 94 mg/dL (ref 70–99)

## 2013-06-04 MED ORDER — ALENDRONATE SODIUM 70 MG PO TABS: 70.0000 mg | ORAL_TABLET | ORAL | Status: DC

## 2013-06-04 NOTE — Patient Instructions (Signed)
  HEALTH MAINTENANCE RECOMMENDATIONS:  It is recommended that you get at least 30 minutes of aerobic exercise at least 5 days/week (for weight loss, you may need as much as 60-90 minutes). This can be any activity that gets your heart rate up. This can be divided in 10-15 minute intervals if needed, but try and build up your endurance at least once a week.  Weight bearing exercise is also recommended twice weekly.  Eat a healthy diet with lots of vegetables, fruits and fiber.  "Colorful" foods have a lot of vitamins (ie green vegetables, tomatoes, red peppers, etc).  Limit sweet tea, regular sodas and alcoholic beverages, all of which has a lot of calories and sugar.  Up to 1 alcoholic drink daily may be beneficial for women (unless trying to lose weight, watch sugars).  Drink a lot of water.  Calcium recommendations are 1200-1500 mg daily (1500 mg for postmenopausal women or women without ovaries), and vitamin D 1000 IU daily.  This should be obtained from diet and/or supplements (vitamins), and calcium should not be taken all at once, but in divided doses.  Monthly self breast exams and yearly mammograms for women over the age of 20 is recommended.  Sunscreen of at least SPF 30 should be used on all sun-exposed parts of the skin when outside between the hours of 10 am and 4 pm (not just when at beach or pool, but even with exercise, golf, tennis, and yard work!)  Use a sunscreen that says "broad spectrum" so it covers both UVA and UVB rays, and make sure to reapply every 1-2 hours.  Remember to change the batteries in your smoke detectors when changing your clock times in the spring and fall.  Use your seat belt every time you are in a car, and please drive safely and not be distracted with cell phones and texting while driving.   Please schedule routine eye exam. Bone density is due in November.

## 2013-06-04 NOTE — Progress Notes (Signed)
Chief Complaint  Patient presents with  . Med check plus    fasting med check plus with pelvic exam. Does have some left sided abdominal pain from time to time that has her concerned. Also has some heartburn occasionally.    Joan Morales is a 73 y.o. female who presents for a med check and annual wellness visit.  She has no specific complaints.  Osteopenia:  She has been on fosamax since 02/2012.  She denies any significant side effects--just occasional heartburn which she thinks may be diet related rather than from medications.  Denies dysphagia.  Occasional pain in her left abdomen, very short-lived (reminiscent of when she had cyst).  Immunization History  Administered Date(s) Administered  . Influenza Split 01/04/2012  . Pneumococcal Polysaccharide-23 09/17/2008  . Tdap 05/07/2005   Last Pap smear: s/p hysterectomy  Last mammogram: 11/2011, scheduled for tomorrow Last colonoscopy: 2010  Last DEXA: 01/2012  Dentist: has been going for routine cleanings in the last year Ophtho: years ago  Exercise: she started walking around the house 2-3x/week  AWV:  Other doctors in her care: Dermatologist (Dr. Jarome Matin or Rolm Bookbinder) Dentist:  Dr. Moss Mc  Has healthcare power of attorney and living will.  See depression and FAST questionnaires in chart (normal).   Past Medical History  Diagnosis Date  . Allergic rhinitis, cause unspecified   . Ovarian teratoma     left, removed 12/06  . Osteopenia     fosamax started 01/2012  . Pure hypercholesterolemia     Past Surgical History  Procedure Laterality Date  . Total abdominal hysterectomy w/ bilateral salpingoophorectomy  12/06    L ovarian teratoma  . Tonsillectomy  child    History   Social History  . Marital Status: Married    Spouse Name: N/A    Number of Children: 1  . Years of Education: N/A   Occupational History  . retired    Social History Main Topics  . Smoking status: Never Smoker   . Smokeless  tobacco: Never Used  . Alcohol Use: No  . Drug Use: No  . Sexual Activity: Not Currently    Partners: Male     Comment: husband with impotence issues   Other Topics Concern  . Not on file   Social History Narrative   Married, lives with husband.  Son lives in [place], [place].  2 grandchildren.  Retired recently Pensions consultant).    Family History  Problem Relation Age of Onset  . Heart disease Mother   . COPD Father     smoker  . Fibromyalgia Sister   . Irritable bowel syndrome Sister   . Diabetes Paternal Aunt   . Breast cancer Maternal Grandmother     50's  . Colon cancer Neg Hx    Outpatient Encounter Prescriptions as of 06/04/2013  Medication Sig  . alendronate (FOSAMAX) 70 MG tablet Take 1 tablet (70 mg total) by mouth every 7 (seven) days. Take with a full glass of water on an empty stomach.  . Ascorbic Acid (VITAMIN C) 1000 MG tablet Take 1,000 mg by mouth daily.  . Calcium Carbonate-Vitamin D (RA CALCIUM PLUS VITAMIN D) 600-400 MG-UNIT per tablet Take 1 tablet by mouth 2 (two) times daily.   . Glucosamine HCl 1000 MG TABS Take 1,000 mg by mouth 2 (two) times daily.  . Omega-3 Fatty Acids (FISH OIL) 1200 MG CAPS Take 2,400 mg by mouth daily.  . vitamin E 400 UNIT capsule Take 400  Units by mouth daily.  . [DISCONTINUED] alendronate (FOSAMAX) 70 MG tablet Take 1 tablet (70 mg total) by mouth every 7 (seven) days. Take with a full glass of water on an empty stomach.  . [DISCONTINUED] benzonatate (TESSALON) 200 MG capsule Take 1 capsule (200 mg total) by mouth 3 (three) times daily as needed for cough.  . [DISCONTINUED] influenza  inactive virus vaccine (FLUZONE/FLUARIX) injection Inject 0.5 mLs into the muscle once.  . [DISCONTINUED] levofloxacin (LEVAQUIN) 500 MG tablet Take 1 tablet (500 mg total) by mouth daily.    Allergies  Allergen Reactions  . Erythromycin Swelling    ROS: The patient denies anorexia, fever, weight changes, headaches, vision changes, decreased  hearing, ear pain, sore throat, breast concerns, chest pain, palpitations, dizziness, syncope, dyspnea on exertion, cough, swelling, nausea, vomiting, diarrhea, constipation, abdominal pain, melena, hematochezia, incontinence, dysuria, vaginal bleeding, discharge, odor or itch, genital lesions, joint pains, numbness, tingling, weakness, tremor, suspicious skin lesions, depression, anxiety, abnormal bleeding/bruising, or enlarged lymph nodes.  Mild seasonal allergies, relieved by chlortrimeton as needed, hasn't started flaring yet. Occasional heartburn, uses pepcid as needed (less frequent than in the past). Arthritis in hands/fingers, sometimes has pain in right index finger, and sometimes knee pain  PHYSICAL EXAM: BP 130/82  Pulse 68  Ht 5' 9.75" (1.772 m)  Wt 164 lb (74.39 kg)  BMI 23.69 kg/m2  General Appearance:  Alert, cooperative, no distress, appears stated age   Head:  Normocephalic, without obvious abnormality, atraumatic   Eyes:  PERRL, conjunctiva/corneas clear, EOM's intact, fundi  benign   Ears:  Normal TM's and external ear canals   Nose:  Nares normal, mucosa mild-mod edematousl, no drainage or sinus tenderness   Throat:  Lips, mucosa, and tongue normal; teeth and gums normal   Neck:  Supple, no lymphadenopathy; thyroid: no enlargement/tenderness/nodules; no carotid  bruit or JVD   Back:  Spine nontender, no curvature, ROM normal, no CVA tenderness   Lungs:  Clear to auscultation bilaterally without wheezes, rales or ronchi; respirations unlabored   Chest Wall:  No tenderness or deformity   Heart:  Regular rate and rhythm, S1 and S2 normal, no murmur, rub  or gallop   Breast Exam:  No tenderness, masses, or nipple discharge or inversion. No axillary lymphadenopathy   Abdomen:  Soft, non-tender, nondistended, normoactive bowel sounds,  no masses, no hepatosplenomegaly   Genitalia:  Normal external genitalia without lesions, mild atrophic changes. BUS and vagina normal;  bimanual exam normal--no pelvic masses appreciable, no tenderness. Pap not performed   Rectal:  Normal tone, no masses or tenderness; guaiac negative stool   Extremities:  No clubbing, cyanosis or edema. Bilateral bunions, hammertoes and callouses. Onychomycotic, thickened nails. Arthritic deformity to DIP's in hands  Pulses:  2+ and symmetric all extremities   Skin:  Skin color, texture, turgor normal, no rashes or lesions   Lymph nodes:  Cervical, supraclavicular, and axillary nodes normal   Neurologic:  CNII-XII intact, normal strength, sensation and gait; reflexes 2+ and symmetric throughout          Psych: Normal mood, affect, hygiene and grooming.   ASSESSMENT/PLAN:  Medicare annual wellness visit, subsequent - Plan: Lipid panel, Glucose, random  Need for prophylactic vaccination against Streptococcus pneumoniae (pneumococcus) - Plan: Pneumococcal conjugate vaccine 13-valent  Screening for diabetes mellitus - Plan: Glucose, random  Screening for lipoid disorders - Plan: Lipid panel  Osteopenia - Plan: HM DEXA SCAN, alendronate (FOSAMAX) 70 MG tablet, DG Bone Density  Bunion  Hammertoe   Bunions and Hammertoes: Flat and wide shoes recommended.  Do not recommend surgery since asymptomatic, but can consider podiatry consult. Onychomycosis--asymptomatic, treatment not recommended (other than clipping toenails)  Osteopenia:  Continue fosamax;DEXA due again in November 2015  Prevnar-13 today Prescription given again today for zostavax (to be gotten after 4/18).  Risks/side effects reviewed.  Encouraged her to schedule routine eye exam.  Discussed monthly self breast exams and yearly mammograms after the age of 33; at least 30 minutes of aerobic activity at least 5 days/week; proper sunscreen use reviewed; healthy diet, including goals of calcium and vitamin D intake and alcohol recommendations (less than or equal to 1 drink/day) reviewed; regular seatbelt use; changing batteries  in smoke detectors. Immunization recommendations discussed--high dose flu shots recommended yearly, Prevnar 13 today.  Shingles vaccine recommended, risks reviewed. Colonoscopy recommendations reviewed, UTD.  Hemoccult cards given  MOST form filled out.  Full code, full care, no prolonged IV fluids/tube feeds

## 2013-06-05 ENCOUNTER — Encounter: Payer: Self-pay | Admitting: Family Medicine

## 2013-06-05 ENCOUNTER — Encounter: Payer: Self-pay | Admitting: Internal Medicine

## 2013-06-05 DIAGNOSIS — Z1231 Encounter for screening mammogram for malignant neoplasm of breast: Secondary | ICD-10-CM | POA: Diagnosis not present

## 2013-06-05 LAB — HM MAMMOGRAPHY

## 2013-06-06 DIAGNOSIS — L821 Other seborrheic keratosis: Secondary | ICD-10-CM | POA: Diagnosis not present

## 2013-12-15 ENCOUNTER — Encounter: Payer: Self-pay | Admitting: Family Medicine

## 2013-12-15 ENCOUNTER — Ambulatory Visit (INDEPENDENT_AMBULATORY_CARE_PROVIDER_SITE_OTHER): Payer: Medicare Other | Admitting: Family Medicine

## 2013-12-15 VITALS — BP 128/80 | HR 72 | Temp 97.7°F | Ht 69.75 in | Wt 167.0 lb

## 2013-12-15 DIAGNOSIS — R109 Unspecified abdominal pain: Secondary | ICD-10-CM

## 2013-12-15 DIAGNOSIS — R1032 Left lower quadrant pain: Secondary | ICD-10-CM | POA: Diagnosis not present

## 2013-12-15 DIAGNOSIS — R8290 Unspecified abnormal findings in urine: Secondary | ICD-10-CM

## 2013-12-15 DIAGNOSIS — Z23 Encounter for immunization: Secondary | ICD-10-CM

## 2013-12-15 DIAGNOSIS — R82998 Other abnormal findings in urine: Secondary | ICD-10-CM

## 2013-12-15 DIAGNOSIS — K5730 Diverticulosis of large intestine without perforation or abscess without bleeding: Secondary | ICD-10-CM

## 2013-12-15 LAB — POCT URINALYSIS DIPSTICK
BILIRUBIN UA: NEGATIVE
GLUCOSE UA: NEGATIVE
Ketones, UA: NEGATIVE
NITRITE UA: NEGATIVE
Protein, UA: NEGATIVE
Spec Grav, UA: 1.01
Urobilinogen, UA: NEGATIVE
pH, UA: 7

## 2013-12-15 NOTE — Patient Instructions (Addendum)
High fiber diet is recommended. Consider trying simethicone (Gas-X) as needed for discomfort.  Return if worsening pain, fevers, nausea/vomiting, blood in stool, or any other concerns.   High-Fiber Diet Fiber is found in fruits, vegetables, and grains. A high-fiber diet encourages the addition of more whole grains, legumes, fruits, and vegetables in your diet. The recommended amount of fiber for adult males is 38 g per day. For adult females, it is 25 g per day. Pregnant and lactating women should get 28 g of fiber per day. If you have a digestive or bowel problem, ask your caregiver for advice before adding high-fiber foods to your diet. Eat a variety of high-fiber foods instead of only a select few type of foods.  PURPOSE  To increase stool bulk.  To make bowel movements more regular to prevent constipation.  To lower cholesterol.  To prevent overeating. WHEN IS THIS DIET USED?  It may be used if you have constipation and hemorrhoids.  It may be used if you have uncomplicated diverticulosis (intestine condition) and irritable bowel syndrome.  It may be used if you need help with weight management.  It may be used if you want to add it to your diet as a protective measure against atherosclerosis, diabetes, and cancer. SOURCES OF FIBER  Whole-grain breads and cereals.  Fruits, such as apples, oranges, bananas, berries, prunes, and pears.  Vegetables, such as green peas, carrots, sweet potatoes, beets, broccoli, cabbage, spinach, and artichokes.  Legumes, such split peas, soy, lentils.  Almonds. FIBER CONTENT IN FOODS Starches and Grains / Dietary Fiber (g)  Cheerios, 1 cup / 3 g  Corn Flakes cereal, 1 cup / 0.7 g  Rice crispy treat cereal, 1 cup / 0.3 g  Instant oatmeal (cooked),  cup / 2 g  Frosted wheat cereal, 1 cup / 5.1 g  Brown, long-grain rice (cooked), 1 cup / 3.5 g  White, long-grain rice (cooked), 1 cup / 0.6 g  Enriched macaroni (cooked), 1 cup / 2.5  g Legumes / Dietary Fiber (g)  Baked beans (canned, plain, or vegetarian),  cup / 5.2 g  Kidney beans (canned),  cup / 6.8 g  Pinto beans (cooked),  cup / 5.5 g Breads and Crackers / Dietary Fiber (g)  Plain or honey graham crackers, 2 squares / 0.7 g  Saltine crackers, 3 squares / 0.3 g  Plain, salted pretzels, 10 pieces / 1.8 g  Whole-wheat bread, 1 slice / 1.9 g  White bread, 1 slice / 0.7 g  Raisin bread, 1 slice / 1.2 g  Plain bagel, 3 oz / 2 g  Flour tortilla, 1 oz / 0.9 g  Corn tortilla, 1 small / 1.5 g  Hamburger or hotdog bun, 1 small / 0.9 g Fruits / Dietary Fiber (g)  Apple with skin, 1 medium / 4.4 g  Sweetened applesauce,  cup / 1.5 g  Banana,  medium / 1.5 g  Grapes, 10 grapes / 0.4 g  Orange, 1 small / 2.3 g  Raisin, 1.5 oz / 1.6 g  Melon, 1 cup / 1.4 g Vegetables / Dietary Fiber (g)  Green beans (canned),  cup / 1.3 g  Carrots (cooked),  cup / 2.3 g  Broccoli (cooked),  cup / 2.8 g  Peas (cooked),  cup / 4.4 g  Mashed potatoes,  cup / 1.6 g  Lettuce, 1 cup / 0.5 g  Corn (canned),  cup / 1.6 g  Tomato,  cup / 1.1 g Document Released:  03/06/2005 Document Revised: 09/05/2011 Document Reviewed: 06/08/2011 Exit[employer] Patient Information 2015 [place], [place]. This information is not intended to replace advice given to you by your health care provider. Make sure you discuss any questions you have with your health care provider.  Diverticulosis Diverticulosis is the condition that develops when small pouches (diverticula) form in the wall of your colon. Your colon, or large intestine, is where water is absorbed and stool is formed. The pouches form when the inside layer of your colon pushes through weak spots in the outer layers of your colon. CAUSES  No one knows exactly what causes diverticulosis. RISK FACTORS  Being older than 46. Your risk for this condition increases with age. Diverticulosis is rare in people younger than 40  years. By age 79, almost everyone has it.  Eating a low-fiber diet.  Being frequently constipated.  Being overweight.  Not getting enough exercise.  Smoking.  Taking over-the-counter pain medicines, like aspirin and ibuprofen. SYMPTOMS  Most people with diverticulosis do not have symptoms. DIAGNOSIS  Because diverticulosis often has no symptoms, health care providers often discover the condition during an exam for other colon problems. In many cases, a health care provider will diagnose diverticulosis while using a flexible scope to examine the colon (colonoscopy). TREATMENT  If you have never developed an infection related to diverticulosis, you may not need treatment. If you have had an infection before, treatment may include:  Eating more fruits, vegetables, and grains.  Taking a fiber supplement.  Taking a live bacteria supplement (probiotic).  Taking medicine to relax your colon. HOME CARE INSTRUCTIONS   Drink at least 6-8 glasses of water each day to prevent constipation.  Try not to strain when you have a bowel movement.  Keep all follow-up appointments. If you have had an infection before:  Increase the fiber in your diet as directed by your health care provider or dietitian.  Take a dietary fiber supplement if your health care provider approves.  Only take medicines as directed by your health care provider. SEEK MEDICAL CARE IF:   You have abdominal pain.  You have bloating.  You have cramps.  You have not gone to the bathroom in 3 days. SEEK IMMEDIATE MEDICAL CARE IF:   Your pain gets worse.  Yourbloating becomes very bad.  You have a fever or chills, and your symptoms suddenly get worse.  You begin vomiting.  You have bowel movements that are bloody or black. MAKE SURE YOU:  Understand these instructions.  Will watch your condition.  Will get help right away if you are not doing well or get worse. Document Released: 12/02/2003 Document  Revised: 03/11/2013 Document Reviewed: 01/29/2013 [facility] Patient Information 2015 [place], [place]. This information is not intended to replace advice given to you by your health care provider. Make sure you discuss any questions you have with your health care provider.

## 2013-12-15 NOTE — Progress Notes (Signed)
Chief Complaint  Patient presents with  . Abdominal Pain    lower abdominal pain x 3 weeks. No vomiting or diarrhea. Mostly pressure.    She gets twinges of pain in the left lower abdomen that has been coming and going over the last 3 weeks.  She is also reporting pressure across the whole lower abdomen (she thinks maybe her clothes are too tight since she gained a little weight). Symptoms started 3 weeks ago, and haven't gotten any worse.  Her bowels have changed some--"gooey", a little looser than normal. Denies any bloody or black stools.  Denies any changes in her diet.  She sometimes notes a little stinging discomfort when voiding.  Denies urgency/frequency, incontinence.  Urine sometimes will have an odor (in the mornings, when concentrated); no cloudiness to the urine.  She doesn't get bladder infections frequently, last time was a year ago. She doesn't think this feels the same.  No h/o kidney stones. She denies any change in activity/new activities (less yardwork recently due to back pain). Denies any vaginal discharge, odor, itch, bleeding/spotting.  Last colonoscopy was 2010; noted to have diverticulosis.  Past Medical History  Diagnosis Date  . Allergic rhinitis, cause unspecified   . Ovarian teratoma     left, removed 12/06  . Osteopenia     fosamax started 01/2012  . Pure hypercholesterolemia    Past Surgical History  Procedure Laterality Date  . Total abdominal hysterectomy w/ bilateral salpingoophorectomy  12/06    L ovarian teratoma  . Tonsillectomy  child   History   Social History  . Marital Status: Married    Spouse Name: N/A    Number of Children: 1  . Years of Education: N/A   Occupational History  . retired    Social History Main Topics  . Smoking status: Never Smoker   . Smokeless tobacco: Never Used  . Alcohol Use: No  . Drug Use: No  . Sexual Activity: Not Currently    Partners: Male     Comment: husband with impotence issues   Other Topics  Concern  . Not on file   Social History Narrative   Married, lives with husband.  Son lives in [place], [place].  2 grandchildren.  Retired recently Pensions consultant).   Outpatient Encounter Prescriptions as of 12/15/2013  Medication Sig  . alendronate (FOSAMAX) 70 MG tablet Take 1 tablet (70 mg total) by mouth every 7 (seven) days. Take with a full glass of water on an empty stomach.  . Ascorbic Acid (VITAMIN C) 1000 MG tablet Take 1,000 mg by mouth daily.  . Calcium Carbonate-Vitamin D (RA CALCIUM PLUS VITAMIN D) 600-400 MG-UNIT per tablet Take 1 tablet by mouth 2 (two) times daily.   . Glucosamine HCl 1000 MG TABS Take 1,000 mg by mouth 2 (two) times daily.  . Omega-3 Fatty Acids (FISH OIL) 1200 MG CAPS Take 2,400 mg by mouth daily.  . vitamin E 400 UNIT capsule Take 400 Units by mouth daily.   Allergies  Allergen Reactions  . Erythromycin Swelling    ROS:  Denies fevers, chills, nausea or vomiting.  No chest pain, shortness of breath, headaches, dizziness, URI symptoms. She has been having some sciatica and lower back pain.  No weakness. No bleeding, bruising, rash or other complaints.  PHYSICAL EXAM: BP 128/80  Pulse 72  Temp(Src) 97.7 F (36.5 C) (Tympanic)  Ht 5' 9.75" (1.772 m)  Wt 167 lb (75.751 kg)  BMI 24.12 kg/m2 Well developed, pleasant female,  in no distress. Neck: no lymphadenopathy, thyromegaly or mass Heart: regular rate and rhythm Lungs: clear bilaterally Back: no CVA tenderness Abdomen:  Active bowel sounds. No suprapubic tenderness.  She had slight discomfort in the LLQ that was not reproducible moments later (likely gas moved).  No masses.  No guarding or rebound tenderness.  Urine dip:  2+ leuks, otherwise normal  ASSESSMENT/PLAN:  Abdominal pain, unspecified site - Plan: POCT Urinalysis Dipstick, Urine culture  Need for prophylactic vaccination and inoculation against influenza - Plan: Flu vaccine HIGH DOSE PF (Fluzone Tri High dose)  Abnormal  urinalysis - with some pressure--send for culture to r/o UTI.  not starting meds presumptively - Plan: Urine culture  LLQ abdominal pain - Ddx reviewed.  clinically not c/w diverticulitis. high fiber diet. simethicone prn. return if fevers, worsening pain, blood in stool  Diverticulosis of colon without hemorrhage  Leukocytes noted on urine dip.  No symptoms of infection (other than some pressure, and rare dysuria)--therefore will hold off on presumptive treatment.  Send for culture, and treat if abnormal.  LLQ pain is likely related to gas and/or diverticulosis, vs mild discomfort from adhesions/prior surgery.  No evidence of obstruction or diverticulitis on exam. High fiber diet is recommended.Consider simethicone (Gas-X) as needed for discomfort.  Return if worsening pain, fevers, nausea/vomiting, blood in stool, or any other concerns.

## 2013-12-17 ENCOUNTER — Ambulatory Visit: Payer: Medicare Other | Admitting: Family Medicine

## 2013-12-18 ENCOUNTER — Other Ambulatory Visit: Payer: Self-pay | Admitting: *Deleted

## 2013-12-18 LAB — URINE CULTURE

## 2013-12-18 MED ORDER — NITROFURANTOIN MONOHYD MACRO 100 MG PO CAPS: 100.0000 mg | ORAL_CAPSULE | Freq: Two times a day (BID) | ORAL | Status: DC

## 2013-12-18 NOTE — Telephone Encounter (Signed)
Done

## 2014-01-19 ENCOUNTER — Encounter: Payer: Self-pay | Admitting: Family Medicine

## 2014-02-23 ENCOUNTER — Telehealth: Payer: Self-pay | Admitting: Internal Medicine

## 2014-02-23 DIAGNOSIS — M8580 Other specified disorders of bone density and structure, unspecified site: Secondary | ICD-10-CM

## 2014-02-23 MED ORDER — ALENDRONATE SODIUM 70 MG PO TABS
70.0000 mg | ORAL_TABLET | ORAL | Status: DC
Start: 2014-02-23 — End: 2014-06-04

## 2014-02-23 NOTE — Telephone Encounter (Signed)
Forgot to send this to you

## 2014-02-23 NOTE — Telephone Encounter (Signed)
Refill request for alendronate 70mg  to [facility] to [facility], [address]

## 2014-02-23 NOTE — Telephone Encounter (Signed)
Done

## 2014-02-25 DIAGNOSIS — S52501A Unspecified fracture of the lower end of right radius, initial encounter for closed fracture: Secondary | ICD-10-CM | POA: Diagnosis not present

## 2014-02-26 DIAGNOSIS — G8918 Other acute postprocedural pain: Secondary | ICD-10-CM | POA: Diagnosis not present

## 2014-02-26 DIAGNOSIS — Y929 Unspecified place or not applicable: Secondary | ICD-10-CM | POA: Diagnosis not present

## 2014-02-26 DIAGNOSIS — W19XXXA Unspecified fall, initial encounter: Secondary | ICD-10-CM | POA: Diagnosis not present

## 2014-02-26 DIAGNOSIS — S52501A Unspecified fracture of the lower end of right radius, initial encounter for closed fracture: Secondary | ICD-10-CM | POA: Diagnosis not present

## 2014-02-26 HISTORY — PX: WRIST FRACTURE SURGERY: SHX121

## 2014-03-09 ENCOUNTER — Encounter (INDEPENDENT_AMBULATORY_CARE_PROVIDER_SITE_OTHER): Payer: Medicare Other | Admitting: Ophthalmology

## 2014-03-09 ENCOUNTER — Encounter (HOSPITAL_COMMUNITY): Payer: Self-pay | Admitting: *Deleted

## 2014-03-09 DIAGNOSIS — H338 Other retinal detachments: Secondary | ICD-10-CM

## 2014-03-09 DIAGNOSIS — H2513 Age-related nuclear cataract, bilateral: Secondary | ICD-10-CM | POA: Diagnosis not present

## 2014-03-09 DIAGNOSIS — H33002 Unspecified retinal detachment with retinal break, left eye: Secondary | ICD-10-CM | POA: Diagnosis present

## 2014-03-09 DIAGNOSIS — H43813 Vitreous degeneration, bilateral: Secondary | ICD-10-CM

## 2014-03-09 DIAGNOSIS — S52501D Unspecified fracture of the lower end of right radius, subsequent encounter for closed fracture with routine healing: Secondary | ICD-10-CM | POA: Diagnosis not present

## 2014-03-09 NOTE — H&P (Signed)
Joan Morales is an 73 y.o. female.   Chief Complaint:loss of vision left eye HPI: lost vision left eye yesterday  Past Medical History  Diagnosis Date  . Allergic rhinitis, cause unspecified   . Ovarian teratoma     left, removed 12/06  . Osteopenia     fosamax started 01/2012  . Pure hypercholesterolemia     Past Surgical History  Procedure Laterality Date  . Total abdominal hysterectomy w/ bilateral salpingoophorectomy  12/06    L ovarian teratoma  . Tonsillectomy  child    Family History  Problem Relation Age of Onset  . Heart disease Mother   . COPD Father     smoker  . Fibromyalgia Sister   . Irritable bowel syndrome Sister   . Diabetes Paternal Aunt   . Breast cancer Maternal Grandmother     50's  . Colon cancer Neg Hx    Social History:  reports that she has never smoked. She has never used smokeless tobacco. She reports that she does not drink alcohol or use illicit drugs.  Allergies:  Allergies  Allergen Reactions  . Erythromycin Swelling    No prescriptions prior to admission    Review of systems otherwise negative  There were no vitals taken for this visit.  Physical exam: Mental status: oriented x3. Eyes: See eye exam associated with this date of surgery in media tab.  Scanned in by scanning center Ears, Nose, Throat: within normal limits Neck: Within Normal limits General: within normal limits Chest: Within normal limits Breast: deferred Heart: Within normal limits Abdomen: Within normal limits GU: deferred Extremities: within normal limits Skin: within normal limits  Assessment/Plan Rhegmatogenous retinal detachment left eye Plan: To [facility] for Scleral buckle, laser, gas injection, possible pars plana vitrectomy in the left eye  Hayden Pedro 03/09/2014, 4:19 PM

## 2014-03-10 ENCOUNTER — Encounter (HOSPITAL_COMMUNITY): Payer: Self-pay | Admitting: *Deleted

## 2014-03-10 ENCOUNTER — Encounter (HOSPITAL_COMMUNITY)
Admission: RE | Disposition: A | Discharge status: Home / Self Care | Payer: Self-pay | Source: Ambulatory Visit | Attending: Ophthalmology

## 2014-03-10 ENCOUNTER — Ambulatory Visit (HOSPITAL_COMMUNITY)
Admission: RE | Admit: 2014-03-10 | Discharge: 2014-03-11 | Disposition: A | Discharge status: Home / Self Care | Payer: Medicare Other | Source: Ambulatory Visit | Attending: Ophthalmology | Admitting: Ophthalmology

## 2014-03-10 ENCOUNTER — Ambulatory Visit (HOSPITAL_COMMUNITY): Payer: Medicare Other | Admitting: Certified Registered"

## 2014-03-10 DIAGNOSIS — Z881 Allergy status to other antibiotic agents status: Secondary | ICD-10-CM | POA: Diagnosis not present

## 2014-03-10 DIAGNOSIS — K219 Gastro-esophageal reflux disease without esophagitis: Secondary | ICD-10-CM | POA: Diagnosis not present

## 2014-03-10 DIAGNOSIS — M1990 Unspecified osteoarthritis, unspecified site: Secondary | ICD-10-CM | POA: Diagnosis not present

## 2014-03-10 DIAGNOSIS — H33002 Unspecified retinal detachment with retinal break, left eye: Secondary | ICD-10-CM | POA: Insufficient documentation

## 2014-03-10 DIAGNOSIS — E780 Pure hypercholesterolemia: Secondary | ICD-10-CM | POA: Diagnosis not present

## 2014-03-10 DIAGNOSIS — M8580 Other specified disorders of bone density and structure, unspecified site: Secondary | ICD-10-CM | POA: Insufficient documentation

## 2014-03-10 DIAGNOSIS — H3322 Serous retinal detachment, left eye: Secondary | ICD-10-CM | POA: Diagnosis not present

## 2014-03-10 HISTORY — PX: SCLERAL BUCKLE WITH CRYO: SHX5341

## 2014-03-10 HISTORY — PX: SCLERAL BUCKLE: SHX5340

## 2014-03-10 HISTORY — DX: Gastro-esophageal reflux disease without esophagitis: K21.9

## 2014-03-10 HISTORY — PX: LASER PHOTO ABLATION: SHX5942

## 2014-03-10 HISTORY — DX: Other specified postprocedural states: Z98.890

## 2014-03-10 HISTORY — DX: Nausea with vomiting, unspecified: R11.2

## 2014-03-10 HISTORY — PX: GAS INSERTION: SHX5336

## 2014-03-10 HISTORY — DX: Unspecified osteoarthritis, unspecified site: M19.90

## 2014-03-10 LAB — CBC
HCT: 36.2 % (ref 36.0–46.0)
Hemoglobin: 12 g/dL (ref 12.0–15.0)
MCH: 29.4 pg (ref 26.0–34.0)
MCHC: 33.1 g/dL (ref 30.0–36.0)
MCV: 88.7 fL (ref 78.0–100.0)
Platelets: 299 10*3/uL (ref 150–400)
RBC: 4.08 MIL/uL (ref 3.87–5.11)
RDW: 13.6 % (ref 11.5–15.5)
WBC: 6.8 10*3/uL (ref 4.0–10.5)

## 2014-03-10 SURGERY — SCLERAL BUCKLE WITH CRYO
Anesthesia: General | Site: Eye | Laterality: Left

## 2014-03-10 MED ORDER — EPINEPHRINE HCL 1 MG/ML IJ SOLN
INTRAMUSCULAR | Status: AC
  Filled 2014-03-10: qty 1

## 2014-03-10 MED ORDER — GATIFLOXACIN 0.5 % OP SOLN
1.0000 [drp] | OPHTHALMIC | Status: AC
  Administered 2014-03-10 (×2): 1 [drp] via OPHTHALMIC
  Filled 2014-03-10: qty 2.5

## 2014-03-10 MED ORDER — BSS IO SOLN
INTRAOCULAR | Status: AC
  Filled 2014-03-10: qty 15

## 2014-03-10 MED ORDER — PHENYLEPHRINE HCL 2.5 % OP SOLN
1.0000 [drp] | OPHTHALMIC | Status: AC
  Administered 2014-03-10 (×2): 1 [drp] via OPHTHALMIC
  Filled 2014-03-10: qty 2

## 2014-03-10 MED ORDER — GATIFLOXACIN 0.5 % OP SOLN
1.0000 [drp] | Freq: Four times a day (QID) | OPHTHALMIC | Status: DC
  Filled 2014-03-10 (×2): qty 2.5

## 2014-03-10 MED ORDER — HYDROCODONE-ACETAMINOPHEN 5-325 MG PO TABS
1.0000 | ORAL_TABLET | ORAL | Status: DC | PRN
  Administered 2014-03-11: 1 via ORAL
  Filled 2014-03-10: qty 1

## 2014-03-10 MED ORDER — ROCURONIUM BROMIDE 100 MG/10ML IV SOLN
INTRAVENOUS | Status: DC | PRN
  Administered 2014-03-10: 40 mg via INTRAVENOUS
  Administered 2014-03-10: 10 mg via INTRAVENOUS

## 2014-03-10 MED ORDER — PHENYLEPHRINE HCL 2.5 % OP SOLN
OPHTHALMIC | Status: AC
  Filled 2014-03-10: qty 2

## 2014-03-10 MED ORDER — TETRACAINE HCL 0.5 % OP SOLN
2.0000 [drp] | Freq: Once | OPHTHALMIC | Status: DC
  Filled 2014-03-10 (×2): qty 2

## 2014-03-10 MED ORDER — ATROPINE SULFATE 1 % OP SOLN
OPHTHALMIC | Status: AC
  Filled 2014-03-10: qty 2

## 2014-03-10 MED ORDER — ONDANSETRON HCL 4 MG/2ML IJ SOLN
INTRAMUSCULAR | Status: DC | PRN
  Administered 2014-03-10: 4 mg via INTRAVENOUS

## 2014-03-10 MED ORDER — CYCLOPENTOLATE HCL 1 % OP SOLN
1.0000 [drp] | OPHTHALMIC | Status: AC | PRN
  Administered 2014-03-10: 1 [drp] via OPHTHALMIC

## 2014-03-10 MED ORDER — CYCLOPENTOLATE HCL 1 % OP SOLN
OPHTHALMIC | Status: AC
  Filled 2014-03-10: qty 2

## 2014-03-10 MED ORDER — CYCLOPENTOLATE HCL 1 % OP SOLN
1.0000 [drp] | OPHTHALMIC | Status: AC
  Administered 2014-03-10 (×2): 1 [drp] via OPHTHALMIC
  Filled 2014-03-10: qty 2

## 2014-03-10 MED ORDER — DOCUSATE SODIUM 100 MG PO CAPS
100.0000 mg | ORAL_CAPSULE | Freq: Two times a day (BID) | ORAL | Status: DC
  Filled 2014-03-10: qty 1

## 2014-03-10 MED ORDER — SODIUM CHLORIDE 0.9 % IV SOLN
INTRAVENOUS | Status: DC
  Administered 2014-03-10 (×3): via INTRAVENOUS

## 2014-03-10 MED ORDER — CEFAZOLIN SODIUM-DEXTROSE 2-3 GM-% IV SOLR
2.0000 g | INTRAVENOUS | Status: AC
  Administered 2014-03-10: 2 g via INTRAVENOUS

## 2014-03-10 MED ORDER — LIDOCAINE HCL 2 % IJ SOLN
INTRAMUSCULAR | Status: AC
  Filled 2014-03-10: qty 20

## 2014-03-10 MED ORDER — BSS IO SOLN
INTRAOCULAR | Status: DC | PRN
  Administered 2014-03-10: 15 mL via INTRAOCULAR

## 2014-03-10 MED ORDER — LIDOCAINE HCL (CARDIAC) 20 MG/ML IV SOLN
INTRAVENOUS | Status: DC | PRN
  Administered 2014-03-10: 70 mg via INTRAVENOUS

## 2014-03-10 MED ORDER — PROPOFOL 10 MG/ML IV BOLUS
INTRAVENOUS | Status: AC
  Filled 2014-03-10: qty 20

## 2014-03-10 MED ORDER — SODIUM HYALURONATE 10 MG/ML IO SOLN
INTRAOCULAR | Status: AC
  Filled 2014-03-10: qty 0.85

## 2014-03-10 MED ORDER — BACITRACIN-POLYMYXIN B 500-10000 UNIT/GM OP OINT
1.0000 "application " | TOPICAL_OINTMENT | Freq: Three times a day (TID) | OPHTHALMIC | Status: DC
  Filled 2014-03-10 (×2): qty 3.5

## 2014-03-10 MED ORDER — DEXAMETHASONE SODIUM PHOSPHATE 4 MG/ML IJ SOLN
INTRAMUSCULAR | Status: AC
  Filled 2014-03-10: qty 2

## 2014-03-10 MED ORDER — GLYCOPYRROLATE 0.2 MG/ML IJ SOLN
INTRAMUSCULAR | Status: DC | PRN
  Administered 2014-03-10: 0.6 mg via INTRAVENOUS

## 2014-03-10 MED ORDER — BSS PLUS IO SOLN
INTRAOCULAR | Status: AC
  Filled 2014-03-10: qty 500

## 2014-03-10 MED ORDER — ROCURONIUM BROMIDE 50 MG/5ML IV SOLN
INTRAVENOUS | Status: AC
  Filled 2014-03-10: qty 1

## 2014-03-10 MED ORDER — ACETAZOLAMIDE SODIUM 500 MG IJ SOLR
INTRAMUSCULAR | Status: AC
  Filled 2014-03-10: qty 500

## 2014-03-10 MED ORDER — SODIUM HYALURONATE 10 MG/ML IO SOLN
INTRAOCULAR | Status: DC | PRN
  Administered 2014-03-10: 0.85 mL via INTRAOCULAR

## 2014-03-10 MED ORDER — HYPROMELLOSE (GONIOSCOPIC) 2.5 % OP SOLN
OPHTHALMIC | Status: AC
  Filled 2014-03-10: qty 15

## 2014-03-10 MED ORDER — CEFAZOLIN SODIUM-DEXTROSE 2-3 GM-% IV SOLR
INTRAVENOUS | Status: AC
  Filled 2014-03-10: qty 50

## 2014-03-10 MED ORDER — POLYMYXIN B SULFATE 500000 UNITS IJ SOLR
INTRAMUSCULAR | Status: AC
  Filled 2014-03-10: qty 1

## 2014-03-10 MED ORDER — ATROPINE SULFATE 1 % OP SOLN
OPHTHALMIC | Status: DC | PRN
  Administered 2014-03-10: 1 [drp] via OPHTHALMIC

## 2014-03-10 MED ORDER — DEXAMETHASONE SODIUM PHOSPHATE 10 MG/ML IJ SOLN
INTRAMUSCULAR | Status: DC | PRN
  Administered 2014-03-10: 8 mg via INTRAVENOUS

## 2014-03-10 MED ORDER — TROPICAMIDE 1 % OP SOLN
OPHTHALMIC | Status: AC
  Filled 2014-03-10: qty 3

## 2014-03-10 MED ORDER — PHENYLEPHRINE HCL 2.5 % OP SOLN
1.0000 [drp] | OPHTHALMIC | Status: AC | PRN
  Administered 2014-03-10: 1 [drp] via OPHTHALMIC

## 2014-03-10 MED ORDER — TROPICAMIDE 1 % OP SOLN
1.0000 [drp] | OPHTHALMIC | Status: AC | PRN
  Administered 2014-03-10: 1 [drp] via OPHTHALMIC

## 2014-03-10 MED ORDER — SODIUM CHLORIDE 0.45 % IV SOLN
INTRAVENOUS | Status: DC
  Administered 2014-03-10: 20 mL/h via INTRAVENOUS

## 2014-03-10 MED ORDER — SUCCINYLCHOLINE CHLORIDE 20 MG/ML IJ SOLN
INTRAMUSCULAR | Status: AC
  Filled 2014-03-10: qty 1

## 2014-03-10 MED ORDER — BACITRACIN-POLYMYXIN B 500-10000 UNIT/GM OP OINT
TOPICAL_OINTMENT | OPHTHALMIC | Status: DC | PRN
  Administered 2014-03-10: 1 via OPHTHALMIC

## 2014-03-10 MED ORDER — HEMOSTATIC AGENTS (NO CHARGE) OPTIME
TOPICAL | Status: DC | PRN
  Administered 2014-03-10: 1 via TOPICAL

## 2014-03-10 MED ORDER — BRIMONIDINE TARTRATE 0.2 % OP SOLN
1.0000 [drp] | Freq: Two times a day (BID) | OPHTHALMIC | Status: DC
  Filled 2014-03-10: qty 5

## 2014-03-10 MED ORDER — BUPIVACAINE HCL (PF) 0.75 % IJ SOLN
INTRAMUSCULAR | Status: AC
  Filled 2014-03-10: qty 10

## 2014-03-10 MED ORDER — BACITRACIN-POLYMYXIN B 500-10000 UNIT/GM OP OINT
TOPICAL_OINTMENT | OPHTHALMIC | Status: AC
  Filled 2014-03-10: qty 3.5

## 2014-03-10 MED ORDER — PREDNISOLONE ACETATE 1 % OP SUSP
1.0000 [drp] | Freq: Four times a day (QID) | OPHTHALMIC | Status: DC
  Filled 2014-03-10: qty 1

## 2014-03-10 MED ORDER — MAGNESIUM HYDROXIDE 400 MG/5ML PO SUSP: 15.0000 mL | Freq: Four times a day (QID) | ORAL | Status: DC | PRN

## 2014-03-10 MED ORDER — ONDANSETRON HCL 4 MG/2ML IJ SOLN
4.0000 mg | Freq: Four times a day (QID) | INTRAMUSCULAR | Status: DC
  Administered 2014-03-10 – 2014-03-11 (×2): 4 mg via INTRAVENOUS
  Filled 2014-03-10 (×3): qty 2

## 2014-03-10 MED ORDER — TEMAZEPAM 15 MG PO CAPS: 15.0000 mg | ORAL_CAPSULE | Freq: Every evening | ORAL | Status: DC | PRN

## 2014-03-10 MED ORDER — GATIFLOXACIN 0.5 % OP SOLN
1.0000 [drp] | OPHTHALMIC | Status: AC | PRN
  Administered 2014-03-10: 1 [drp] via OPHTHALMIC

## 2014-03-10 MED ORDER — MORPHINE SULFATE 2 MG/ML IJ SOLN
1.0000 mg | INTRAMUSCULAR | Status: AC | PRN
  Administered 2014-03-10 (×2): 2 mg via INTRAVENOUS
  Filled 2014-03-10 (×2): qty 1

## 2014-03-10 MED ORDER — DEXAMETHASONE SODIUM PHOSPHATE 10 MG/ML IJ SOLN
INTRAMUSCULAR | Status: AC
  Filled 2014-03-10: qty 1

## 2014-03-10 MED ORDER — ACETAZOLAMIDE SODIUM 500 MG IJ SOLR
500.0000 mg | Freq: Once | INTRAMUSCULAR | Status: AC
  Administered 2014-03-11: 500 mg via INTRAVENOUS
  Filled 2014-03-10: qty 500

## 2014-03-10 MED ORDER — SODIUM CHLORIDE 0.9 % IJ SOLN
INTRAMUSCULAR | Status: AC
  Filled 2014-03-10: qty 10

## 2014-03-10 MED ORDER — SODIUM CHLORIDE 0.9 % IJ SOLN
INTRAMUSCULAR | Status: DC | PRN
  Administered 2014-03-10: 13:00:00

## 2014-03-10 MED ORDER — DEXAMETHASONE SODIUM PHOSPHATE 10 MG/ML IJ SOLN
INTRAMUSCULAR | Status: DC | PRN
  Administered 2014-03-10: 10 mg via INTRAVENOUS

## 2014-03-10 MED ORDER — TRIAMCINOLONE ACETONIDE 40 MG/ML IJ SUSP
INTRAMUSCULAR | Status: AC
  Filled 2014-03-10: qty 5

## 2014-03-10 MED ORDER — LATANOPROST 0.005 % OP SOLN
1.0000 [drp] | Freq: Every day | OPHTHALMIC | Status: DC
  Filled 2014-03-10: qty 2.5

## 2014-03-10 MED ORDER — FENTANYL CITRATE 0.05 MG/ML IJ SOLN
INTRAMUSCULAR | Status: AC
  Filled 2014-03-10: qty 5

## 2014-03-10 MED ORDER — ACETAMINOPHEN 325 MG PO TABS: 325.0000 mg | ORAL_TABLET | ORAL | Status: DC | PRN

## 2014-03-10 MED ORDER — NEOSTIGMINE METHYLSULFATE 10 MG/10ML IV SOLN
INTRAVENOUS | Status: DC | PRN
  Administered 2014-03-10: 4 mg via INTRAVENOUS

## 2014-03-10 MED ORDER — PROPOFOL 10 MG/ML IV BOLUS
INTRAVENOUS | Status: DC | PRN
  Administered 2014-03-10: 140 mg via INTRAVENOUS

## 2014-03-10 MED ORDER — GENTAMICIN SULFATE 40 MG/ML IJ SOLN
INTRAMUSCULAR | Status: AC
  Filled 2014-03-10: qty 2

## 2014-03-10 MED ORDER — BUPIVACAINE HCL (PF) 0.75 % IJ SOLN
INTRAMUSCULAR | Status: DC | PRN
  Administered 2014-03-10: 20 mL

## 2014-03-10 MED ORDER — FENTANYL CITRATE 0.05 MG/ML IJ SOLN
INTRAMUSCULAR | Status: DC | PRN
  Administered 2014-03-10: 75 ug via INTRAVENOUS
  Administered 2014-03-10: 125 ug via INTRAVENOUS
  Administered 2014-03-10: 50 ug via INTRAVENOUS

## 2014-03-10 MED ORDER — TROPICAMIDE 1 % OP SOLN
1.0000 [drp] | OPHTHALMIC | Status: AC
  Administered 2014-03-10 (×2): 1 [drp] via OPHTHALMIC
  Filled 2014-03-10: qty 2

## 2014-03-10 MED ORDER — GATIFLOXACIN 0.5 % OP SOLN
OPHTHALMIC | Status: AC
  Filled 2014-03-10: qty 2.5

## 2014-03-10 SURGICAL SUPPLY — 73 items
APPLICATOR DR MATTHEWS STRL (MISCELLANEOUS) ×24 IMPLANT
BAND SCLERAL BUCKLING TYPE 240 (Ophthalmic Related) ×4 IMPLANT
BLADE EYE CATARACT 19 1.4 BEAV (BLADE) IMPLANT
BLADE MVR KNIFE 19G (BLADE) IMPLANT
CANNULA ANT CHAM MAIN (OPHTHALMIC RELATED) IMPLANT
CANNULA DUAL BORE 23G (CANNULA) IMPLANT
CORDS BIPOLAR (ELECTRODE) IMPLANT
COTTONBALL LRG STERILE PKG (GAUZE/BANDAGES/DRESSINGS) ×12 IMPLANT
COVER MAYO STAND STRL (DRAPES) IMPLANT
COVER SURGICAL LIGHT HANDLE (MISCELLANEOUS) ×4 IMPLANT
DRAPE INCISE 51X51 W/FILM STRL (DRAPES) ×4 IMPLANT
DRAPE OPHTHALMIC 77X100 STRL (CUSTOM PROCEDURE TRAY) ×4 IMPLANT
ERASER HMR WETFIELD 23G BP (MISCELLANEOUS) IMPLANT
FILTER BLUE MILLIPORE (MISCELLANEOUS) ×8 IMPLANT
FILTER STRAW FLUID ASPIR (MISCELLANEOUS) IMPLANT
FORCEPS GRIESHABER ILM 25G A (INSTRUMENTS) IMPLANT
GAS AUTO FILL CONSTEL (OPHTHALMIC)
GAS AUTO FILL CONSTELLATION (OPHTHALMIC) IMPLANT
GLOVE BIOGEL PI IND STRL 6.5 (GLOVE) ×2 IMPLANT
GLOVE BIOGEL PI INDICATOR 6.5 (GLOVE) ×2
GLOVE ECLIPSE 6.5 STRL STRAW (GLOVE) ×4 IMPLANT
GLOVE SS BIOGEL STRL SZ 6.5 (GLOVE) ×2 IMPLANT
GLOVE SS BIOGEL STRL SZ 7 (GLOVE) ×2 IMPLANT
GLOVE SUPERSENSE BIOGEL SZ 6.5 (GLOVE) ×2
GLOVE SUPERSENSE BIOGEL SZ 7 (GLOVE) ×2
GLOVE SURG 8.5 LATEX PF (GLOVE) ×4 IMPLANT
GOWN STRL REUS W/ TWL LRG LVL3 (GOWN DISPOSABLE) ×4 IMPLANT
GOWN STRL REUS W/TWL LRG LVL3 (GOWN DISPOSABLE) ×4
HANDLE PNEUMATIC FOR CONSTEL (OPHTHALMIC) IMPLANT
IMPLANT SILICONE (Ophthalmic Related) ×2 IMPLANT
KIT BASIN OR (CUSTOM PROCEDURE TRAY) ×4 IMPLANT
KIT PERFLUORON PROCEDURE 5ML (MISCELLANEOUS) IMPLANT
KIT ROOM TURNOVER OR (KITS) ×4 IMPLANT
KNIFE CRESCENT 1.75 EDGEAHEAD (BLADE) IMPLANT
KNIFE GRIESHABER SHARP 2.5MM (MISCELLANEOUS) ×16 IMPLANT
MICROPICK 25G (MISCELLANEOUS)
NEEDLE 18GX1X1/2 (RX/OR ONLY) (NEEDLE) ×4 IMPLANT
NEEDLE 25GX 5/8IN NON SAFETY (NEEDLE) IMPLANT
NEEDLE 27GAX1X1/2 (NEEDLE) IMPLANT
NEEDLE HYPO 30X.5 LL (NEEDLE) ×12 IMPLANT
NS IRRIG 1000ML POUR BTL (IV SOLUTION) ×4 IMPLANT
PACK VITRECTOMY CUSTOM (CUSTOM PROCEDURE TRAY) ×4 IMPLANT
PAD ARMBOARD 7.5X6 YLW CONV (MISCELLANEOUS) ×8 IMPLANT
PAK PIK VITRECTOMY CVS 25GA (OPHTHALMIC) IMPLANT
PIC ILLUMINATED 25G (OPHTHALMIC)
PICK MICROPICK 25G (MISCELLANEOUS) IMPLANT
PIK ILLUMINATED 25G (OPHTHALMIC) IMPLANT
PROBE LASER ILLUM FLEX CVD 25G (OPHTHALMIC) IMPLANT
REPL STRA BRUSH NEEDLE (NEEDLE) IMPLANT
RESERVOIR BACK FLUSH (MISCELLANEOUS) IMPLANT
ROLLS DENTAL (MISCELLANEOUS) ×8 IMPLANT
SLEEVE SCLERAL TYPE 270 (Ophthalmic Related) ×4 IMPLANT
SPEAR EYE SURG WECK-CEL (MISCELLANEOUS) ×16 IMPLANT
SPONGE GROOVED SILICONE 4X12X8 (Ophthalmic Related) ×4 IMPLANT
SPONGE SURGIFOAM ABS GEL 12-7 (HEMOSTASIS) ×4 IMPLANT
STOPCOCK 4 WAY LG BORE MALE ST (IV SETS) IMPLANT
SUT CHROMIC 7 0 TG140 8 (SUTURE) ×4 IMPLANT
SUT ETHILON 9 0 TG140 8 (SUTURE) IMPLANT
SUT MERSILENE 4 0 RV 2 (SUTURE) ×8 IMPLANT
SUT SILK 2 0 (SUTURE) ×2
SUT SILK 2-0 18XBRD TIE 12 (SUTURE) ×2 IMPLANT
SUT SILK 4 0 RB 1 (SUTURE) ×4 IMPLANT
SYR 20CC LL (SYRINGE) ×8 IMPLANT
SYR 50ML LL SCALE MARK (SYRINGE) IMPLANT
SYR 5ML LL (SYRINGE) ×4 IMPLANT
SYR BULB 3OZ (MISCELLANEOUS) ×4 IMPLANT
SYR TB 1ML LUER SLIP (SYRINGE) IMPLANT
SYRINGE 10CC LL (SYRINGE) ×4 IMPLANT
TIRE RTNL 2.5XGRV CNCV 9X (Ophthalmic Related) ×2 IMPLANT
TOWEL OR 17X24 6PK STRL BLUE (TOWEL DISPOSABLE) ×12 IMPLANT
TUBING ART PRESS 12 MALE/MALE (MISCELLANEOUS) IMPLANT
WATER STERILE IRR 1000ML POUR (IV SOLUTION) ×4 IMPLANT
WIPE INSTRUMENT VISIWIPE 73X73 (MISCELLANEOUS) ×4 IMPLANT

## 2014-03-10 NOTE — Anesthesia Procedure Notes (Signed)
Procedure Name: Intubation Date/Time: 03/10/2014 1:16 PM Performed by: Maeola Harman Pre-anesthesia Checklist: Patient identified, Emergency Drugs available, Suction available, Patient being monitored and Timeout performed Patient Re-evaluated:Patient Re-evaluated prior to inductionOxygen Delivery Method: Circle system utilized Preoxygenation: Pre-oxygenation with 100% oxygen Intubation Type: IV induction Ventilation: Mask ventilation without difficulty Laryngoscope Size: Mac and 3 Grade View: Grade I Tube type: Oral Tube size: 7.5 mm Number of attempts: 1 Airway Equipment and Method: Stylet Placement Confirmation: ETT inserted through vocal cords under direct vision,  positive ETCO2 and breath sounds checked- equal and bilateral Secured at: 22 cm Tube secured with: Tape Dental Injury: Teeth and Oropharynx as per pre-operative assessment

## 2014-03-10 NOTE — Brief Op Note (Signed)
Brief Operative note   Preoperative diagnosis:  retinal detachment left eye Postoperative diagnosis Rhegmatogenous retinal detachment left eye  Procedures: Scleral buckle, gas injection, laser left eye  Surgeon:  Hayden Pedro, MD...  Assistant:  Deatra Ina SA    Anesthesia: General  Specimen: none  Estimated blood loss:  1cc  Complications: none  Patient sent to PACU in good condition  Composed by Hayden Pedro MD  Dictation number: [id number]

## 2014-03-10 NOTE — Anesthesia Preprocedure Evaluation (Addendum)
Anesthesia Evaluation  Patient identified by MRN, date of birth, ID band Patient awake    Reviewed: Allergy & Precautions, H&P , NPO status , Patient's Chart, lab work & pertinent test results, Unable to perform ROS - Chart review only  History of Anesthesia Complications (+) PONV  Airway        Dental   Pulmonary neg pulmonary ROS,          Cardiovascular     Neuro/Psych negative neurological ROS  negative psych ROS   GI/Hepatic negative GI ROS, Neg liver ROS,   Endo/Other  negative endocrine ROS  Renal/GU negative Renal ROS     Musculoskeletal  (+) Arthritis -,   Abdominal   Peds  Hematology   Anesthesia Other Findings   Reproductive/Obstetrics negative OB ROS                             Anesthesia Physical Anesthesia Plan  ASA: II  Anesthesia Plan: General   Post-op Pain Management:    Induction: Intravenous  Airway Management Planned: Oral ETT  Additional Equipment:   Intra-op Plan:   Post-operative Plan:   Informed Consent: I have reviewed the patients History and Physical, chart, labs and discussed the procedure including the risks, benefits and alternatives for the proposed anesthesia with the patient or authorized representative who has indicated his/her understanding and acceptance.   Dental advisory given  Plan Discussed with: CRNA, Surgeon and Anesthesiologist  Anesthesia Plan Comments:         Anesthesia Quick Evaluation

## 2014-03-10 NOTE — H&P (Signed)
I examined the patient today and there is no change in the medical status 

## 2014-03-10 NOTE — Anesthesia Postprocedure Evaluation (Signed)
  Anesthesia Post-op Note  Patient: Joan Morales  Procedure(s) Performed: Procedure(s): SCLERAL BUCKLE WITH CRYO AND DIATHERMY (Left) LASER PHOTO ABLATION-HEADSCOPE LASER (Left) INSERTION OF GAS-C3F8 (Left)  Patient Location: PACU  Anesthesia Type:General  Level of Consciousness: awake and alert   Airway and Oxygen Therapy: Patient Spontanous Breathing  Post-op Pain: none  Post-op Assessment: Post-op Vital signs reviewed, Patient's Cardiovascular Status Stable, Respiratory Function Stable, Patent Airway, No signs of Nausea or vomiting and Pain level controlled  Post-op Vital Signs: Reviewed and stable  Last Vitals:  Filed Vitals:   03/10/14 1645  BP: 142/57  Pulse: 72  Temp: 37 C  Resp: 16    Complications: No apparent anesthesia complications

## 2014-03-10 NOTE — Transfer of Care (Signed)
Immediate Anesthesia Transfer of Care Note  Patient: Joan Morales  Procedure(s) Performed: Procedure(s): SCLERAL BUCKLE WITH CRYO AND DIATHERMY (Left) LASER PHOTO ABLATION-HEADSCOPE LASER (Left) INSERTION OF GAS-C3F8 (Left)  Patient Location: PACU  Anesthesia Type:General  Level of Consciousness: awake, alert  and oriented  Airway & Oxygen Therapy: Patient connected to face mask oxygen  Post-op Assessment: Report given to PACU RN  Post vital signs: stable  Complications: No apparent anesthesia complications

## 2014-03-11 ENCOUNTER — Encounter (HOSPITAL_COMMUNITY): Payer: Self-pay | Admitting: Ophthalmology

## 2014-03-11 DIAGNOSIS — H33002 Unspecified retinal detachment with retinal break, left eye: Secondary | ICD-10-CM | POA: Diagnosis not present

## 2014-03-11 MED ORDER — GATIFLOXACIN 0.5 % OP SOLN: 1.0000 [drp] | Freq: Four times a day (QID) | OPHTHALMIC | Status: DC

## 2014-03-11 MED ORDER — PREDNISOLONE ACETATE 1 % OP SUSP: 1.0000 [drp] | Freq: Four times a day (QID) | OPHTHALMIC | Status: DC

## 2014-03-11 MED ORDER — BRIMONIDINE TARTRATE 0.2 % OP SOLN: 1.0000 [drp] | Freq: Two times a day (BID) | OPHTHALMIC | Status: DC

## 2014-03-11 MED ORDER — BACITRACIN-POLYMYXIN B 500-10000 UNIT/GM OP OINT: 1.0000 "application " | TOPICAL_OINTMENT | Freq: Three times a day (TID) | OPHTHALMIC | Status: DC

## 2014-03-11 NOTE — Progress Notes (Signed)
Discharge instructions given and explained to pt.  Eye drops given to pt by MD.  Pt verbalizes understanding of all orders/instructions and denies any questions at this time.  IV removed by night RN.  VSS. Pt in no s/s of distress and eager to go home. Graceann Congress

## 2014-03-11 NOTE — Discharge Summary (Signed)
Discharge summary not needed on [facility] patients per medical records. 

## 2014-03-11 NOTE — Progress Notes (Signed)
03/11/2014, 6:52 AM  Mental Status:  Awake, Alert, Oriented  Anterior segment: Cornea  Clear    Anterior Chamber Clear    Lens:   Cataract  Intra Ocular Pressure 24 mmHg with Tonopen  Vitreous: Clear 40%gas bubble   Retina:  Attached Good laser reaction   Impression: Excellent result Retina attached   Final Diagnosis: Principal Problem:   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   Plan: start post operative eye drops.   Add Alphagan OS BID.  Discharge to home.  Give post operative instructions  Joan Morales 03/11/2014, 6:52 AM

## 2014-03-11 NOTE — Op Note (Signed)
NAMETERRILL, Joan Morales NO.:  [id number]  MEDICAL RECORD NO.:  [id number]  LOCATION:  6N24C                        FACILITY:  [facility]  PHYSICIAN:  Chrystie Nose. Zigmund Daniel, M.D. DATE OF BIRTH:  1941-02-14  DATE OF PROCEDURE:  03/10/2014 DATE OF DISCHARGE:                              OPERATIVE REPORT   ADMISSION DIAGNOSIS:  Rhegmatogenous retinal detachment, left eye.  PROCEDURE:  Scleral buckle, left eye; gas injection, left eye; retinal photocoagulation, left eye.  SURGEON:  Chrystie Nose. Zigmund Daniel, M.D.  ASSISTANT:  Deatra Ina, SA.  ANESTHESIA:  General.  DETAILS:  Usual prep and drape, 360-degree limbal peritomy isolation of 4 rectus muscles on 2-0 silk, scleral dissection for 360 degrees to admit a #279 intrascleral implant.  Diathermy was placed in the bed.  A 508 G radial segment was placed beneath the break at 2 o'clock.  The 279 implant was placed around the globe with the joint at 10 o'clock 1 mm was trimmed from the posterior edge.  A 240 band was placed around the globe with a 270 sleeve at 10 o'clock.  Perforation site was chosen at 2:30 o'clock in the posterior aspect of the bed.  Incision into the scleral was performed.  Then, diathermy of the choroid.  The perforation was made with a white diathermy probe.  A large amount of clear colorless subretinal fluid came forth in a controlled manner.  C3F8 1.2 mL 50% was injected into the vitreous cavity to reinflate the globe as the fluid egressed.  The 2 sutures per quadrant for a total of 8 scleral sutures were placed in the scleral flaps.  The sutures were drawn securely and knotted.  The buckle was adjusted and trimmed.  The band was adjusted and trimmed.  The 508 G radial segment was placed against the globe at 2 o'clock.  Indirect ophthalmoscopy showed the retina to be lying nicely in place on the scleral buckle with a break well-supported. The indirect ophthalmoscope laser was moved into place.  661 burns  were placed around the retinal periphery on the buckle.  The power was between 400 and 600 mW 1000 microns each and 0.1 seconds each.  The buckle was again adjusted and trimmed.  The band was adjusted and trimmed.  The scleral sutures were knotted and the free ends were removed.  The conjunctiva was reposited with 7-0 chromic suture. Polymyxin and gentamicin were irrigated into tenon space.  Atropine solution was applied.  Marcaine was injected around the globe for postop pain.  Polysporin ophthalmic ointment, a patch, and shield were placed. The closing pressure was 10 with a Barraquer tonometer.  COMPLICATIONS:  None.  DURATION:  2 hours.     Chrystie Nose. Zigmund Daniel, M.D.     JDM/MEDQ  D:  03/10/2014  T:  03/11/2014  Job:  [id number]

## 2014-03-17 ENCOUNTER — Inpatient Hospital Stay (INDEPENDENT_AMBULATORY_CARE_PROVIDER_SITE_OTHER): Payer: BLUE CROSS/BLUE SHIELD | Admitting: Ophthalmology

## 2014-03-17 DIAGNOSIS — H338 Other retinal detachments: Secondary | ICD-10-CM

## 2014-04-06 ENCOUNTER — Encounter (INDEPENDENT_AMBULATORY_CARE_PROVIDER_SITE_OTHER): Payer: BLUE CROSS/BLUE SHIELD | Admitting: Ophthalmology

## 2014-04-06 DIAGNOSIS — H338 Other retinal detachments: Secondary | ICD-10-CM

## 2014-04-06 DIAGNOSIS — S52501D Unspecified fracture of the lower end of right radius, subsequent encounter for closed fracture with routine healing: Secondary | ICD-10-CM | POA: Diagnosis not present

## 2014-04-13 DIAGNOSIS — R531 Weakness: Secondary | ICD-10-CM | POA: Diagnosis not present

## 2014-04-13 DIAGNOSIS — M25631 Stiffness of right wrist, not elsewhere classified: Secondary | ICD-10-CM | POA: Diagnosis not present

## 2014-04-13 DIAGNOSIS — S52501D Unspecified fracture of the lower end of right radius, subsequent encounter for closed fracture with routine healing: Secondary | ICD-10-CM | POA: Diagnosis not present

## 2014-04-17 DIAGNOSIS — M25631 Stiffness of right wrist, not elsewhere classified: Secondary | ICD-10-CM | POA: Diagnosis not present

## 2014-04-17 DIAGNOSIS — M25431 Effusion, right wrist: Secondary | ICD-10-CM | POA: Diagnosis not present

## 2014-04-17 DIAGNOSIS — M24541 Contracture, right hand: Secondary | ICD-10-CM | POA: Diagnosis not present

## 2014-04-17 DIAGNOSIS — M25531 Pain in right wrist: Secondary | ICD-10-CM | POA: Diagnosis not present

## 2014-04-20 DIAGNOSIS — M24541 Contracture, right hand: Secondary | ICD-10-CM | POA: Diagnosis not present

## 2014-04-20 DIAGNOSIS — M25631 Stiffness of right wrist, not elsewhere classified: Secondary | ICD-10-CM | POA: Diagnosis not present

## 2014-04-20 DIAGNOSIS — M25531 Pain in right wrist: Secondary | ICD-10-CM | POA: Diagnosis not present

## 2014-04-20 DIAGNOSIS — M25431 Effusion, right wrist: Secondary | ICD-10-CM | POA: Diagnosis not present

## 2014-04-24 DIAGNOSIS — M25631 Stiffness of right wrist, not elsewhere classified: Secondary | ICD-10-CM | POA: Diagnosis not present

## 2014-04-24 DIAGNOSIS — M25531 Pain in right wrist: Secondary | ICD-10-CM | POA: Diagnosis not present

## 2014-04-24 DIAGNOSIS — M24541 Contracture, right hand: Secondary | ICD-10-CM | POA: Diagnosis not present

## 2014-04-24 DIAGNOSIS — M25431 Effusion, right wrist: Secondary | ICD-10-CM | POA: Diagnosis not present

## 2014-04-27 DIAGNOSIS — M25431 Effusion, right wrist: Secondary | ICD-10-CM | POA: Diagnosis not present

## 2014-04-27 DIAGNOSIS — M25631 Stiffness of right wrist, not elsewhere classified: Secondary | ICD-10-CM | POA: Diagnosis not present

## 2014-04-27 DIAGNOSIS — M25531 Pain in right wrist: Secondary | ICD-10-CM | POA: Diagnosis not present

## 2014-04-27 DIAGNOSIS — M24541 Contracture, right hand: Secondary | ICD-10-CM | POA: Diagnosis not present

## 2014-05-01 DIAGNOSIS — M25431 Effusion, right wrist: Secondary | ICD-10-CM | POA: Diagnosis not present

## 2014-05-01 DIAGNOSIS — M25631 Stiffness of right wrist, not elsewhere classified: Secondary | ICD-10-CM | POA: Diagnosis not present

## 2014-05-01 DIAGNOSIS — M24541 Contracture, right hand: Secondary | ICD-10-CM | POA: Diagnosis not present

## 2014-05-01 DIAGNOSIS — M25531 Pain in right wrist: Secondary | ICD-10-CM | POA: Diagnosis not present

## 2014-05-04 DIAGNOSIS — M24541 Contracture, right hand: Secondary | ICD-10-CM | POA: Diagnosis not present

## 2014-05-04 DIAGNOSIS — M25431 Effusion, right wrist: Secondary | ICD-10-CM | POA: Diagnosis not present

## 2014-05-04 DIAGNOSIS — M25631 Stiffness of right wrist, not elsewhere classified: Secondary | ICD-10-CM | POA: Diagnosis not present

## 2014-05-04 DIAGNOSIS — M25531 Pain in right wrist: Secondary | ICD-10-CM | POA: Diagnosis not present

## 2014-05-08 DIAGNOSIS — M25431 Effusion, right wrist: Secondary | ICD-10-CM | POA: Diagnosis not present

## 2014-05-08 DIAGNOSIS — M24541 Contracture, right hand: Secondary | ICD-10-CM | POA: Diagnosis not present

## 2014-05-08 DIAGNOSIS — M25531 Pain in right wrist: Secondary | ICD-10-CM | POA: Diagnosis not present

## 2014-05-08 DIAGNOSIS — M25631 Stiffness of right wrist, not elsewhere classified: Secondary | ICD-10-CM | POA: Diagnosis not present

## 2014-05-11 DIAGNOSIS — M25631 Stiffness of right wrist, not elsewhere classified: Secondary | ICD-10-CM | POA: Diagnosis not present

## 2014-05-11 DIAGNOSIS — M24541 Contracture, right hand: Secondary | ICD-10-CM | POA: Diagnosis not present

## 2014-05-11 DIAGNOSIS — M25431 Effusion, right wrist: Secondary | ICD-10-CM | POA: Diagnosis not present

## 2014-05-11 DIAGNOSIS — M25531 Pain in right wrist: Secondary | ICD-10-CM | POA: Diagnosis not present

## 2014-05-15 DIAGNOSIS — M25531 Pain in right wrist: Secondary | ICD-10-CM | POA: Diagnosis not present

## 2014-05-15 DIAGNOSIS — M25431 Effusion, right wrist: Secondary | ICD-10-CM | POA: Diagnosis not present

## 2014-05-15 DIAGNOSIS — M25631 Stiffness of right wrist, not elsewhere classified: Secondary | ICD-10-CM | POA: Diagnosis not present

## 2014-05-15 DIAGNOSIS — M24541 Contracture, right hand: Secondary | ICD-10-CM | POA: Diagnosis not present

## 2014-05-18 DIAGNOSIS — M25431 Effusion, right wrist: Secondary | ICD-10-CM | POA: Diagnosis not present

## 2014-05-18 DIAGNOSIS — M24541 Contracture, right hand: Secondary | ICD-10-CM | POA: Diagnosis not present

## 2014-05-18 DIAGNOSIS — M25531 Pain in right wrist: Secondary | ICD-10-CM | POA: Diagnosis not present

## 2014-05-18 DIAGNOSIS — M25631 Stiffness of right wrist, not elsewhere classified: Secondary | ICD-10-CM | POA: Diagnosis not present

## 2014-05-22 DIAGNOSIS — M25431 Effusion, right wrist: Secondary | ICD-10-CM | POA: Diagnosis not present

## 2014-05-22 DIAGNOSIS — M25631 Stiffness of right wrist, not elsewhere classified: Secondary | ICD-10-CM | POA: Diagnosis not present

## 2014-05-22 DIAGNOSIS — M24541 Contracture, right hand: Secondary | ICD-10-CM | POA: Diagnosis not present

## 2014-05-22 DIAGNOSIS — M25531 Pain in right wrist: Secondary | ICD-10-CM | POA: Diagnosis not present

## 2014-05-25 DIAGNOSIS — S52501D Unspecified fracture of the lower end of right radius, subsequent encounter for closed fracture with routine healing: Secondary | ICD-10-CM | POA: Diagnosis not present

## 2014-05-28 DIAGNOSIS — M24541 Contracture, right hand: Secondary | ICD-10-CM | POA: Diagnosis not present

## 2014-05-28 DIAGNOSIS — M25531 Pain in right wrist: Secondary | ICD-10-CM | POA: Diagnosis not present

## 2014-05-28 DIAGNOSIS — M25431 Effusion, right wrist: Secondary | ICD-10-CM | POA: Diagnosis not present

## 2014-05-28 DIAGNOSIS — M25631 Stiffness of right wrist, not elsewhere classified: Secondary | ICD-10-CM | POA: Diagnosis not present

## 2014-06-01 DIAGNOSIS — M25531 Pain in right wrist: Secondary | ICD-10-CM | POA: Diagnosis not present

## 2014-06-01 DIAGNOSIS — M24541 Contracture, right hand: Secondary | ICD-10-CM | POA: Diagnosis not present

## 2014-06-01 DIAGNOSIS — M25631 Stiffness of right wrist, not elsewhere classified: Secondary | ICD-10-CM | POA: Diagnosis not present

## 2014-06-01 DIAGNOSIS — M25431 Effusion, right wrist: Secondary | ICD-10-CM | POA: Diagnosis not present

## 2014-06-04 ENCOUNTER — Other Ambulatory Visit: Payer: Self-pay | Admitting: Family Medicine

## 2014-06-05 DIAGNOSIS — M25531 Pain in right wrist: Secondary | ICD-10-CM | POA: Diagnosis not present

## 2014-06-05 DIAGNOSIS — M25431 Effusion, right wrist: Secondary | ICD-10-CM | POA: Diagnosis not present

## 2014-06-05 DIAGNOSIS — M24541 Contracture, right hand: Secondary | ICD-10-CM | POA: Diagnosis not present

## 2014-06-05 DIAGNOSIS — M25631 Stiffness of right wrist, not elsewhere classified: Secondary | ICD-10-CM | POA: Diagnosis not present

## 2014-06-10 ENCOUNTER — Encounter: Payer: Medicare Other | Admitting: Family Medicine

## 2014-06-15 DIAGNOSIS — M25431 Effusion, right wrist: Secondary | ICD-10-CM | POA: Diagnosis not present

## 2014-06-15 DIAGNOSIS — M24541 Contracture, right hand: Secondary | ICD-10-CM | POA: Diagnosis not present

## 2014-06-15 DIAGNOSIS — M25631 Stiffness of right wrist, not elsewhere classified: Secondary | ICD-10-CM | POA: Diagnosis not present

## 2014-06-15 DIAGNOSIS — M25531 Pain in right wrist: Secondary | ICD-10-CM | POA: Diagnosis not present

## 2014-06-18 DIAGNOSIS — M25631 Stiffness of right wrist, not elsewhere classified: Secondary | ICD-10-CM | POA: Diagnosis not present

## 2014-06-18 DIAGNOSIS — M25531 Pain in right wrist: Secondary | ICD-10-CM | POA: Diagnosis not present

## 2014-06-18 DIAGNOSIS — M25431 Effusion, right wrist: Secondary | ICD-10-CM | POA: Diagnosis not present

## 2014-06-18 DIAGNOSIS — M24541 Contracture, right hand: Secondary | ICD-10-CM | POA: Diagnosis not present

## 2014-06-22 ENCOUNTER — Encounter (INDEPENDENT_AMBULATORY_CARE_PROVIDER_SITE_OTHER): Payer: Medicare Other | Admitting: Ophthalmology

## 2014-06-22 DIAGNOSIS — H35372 Puckering of macula, left eye: Secondary | ICD-10-CM

## 2014-06-22 DIAGNOSIS — H2513 Age-related nuclear cataract, bilateral: Secondary | ICD-10-CM | POA: Diagnosis not present

## 2014-06-22 DIAGNOSIS — H43813 Vitreous degeneration, bilateral: Secondary | ICD-10-CM

## 2014-06-22 DIAGNOSIS — H338 Other retinal detachments: Secondary | ICD-10-CM | POA: Diagnosis not present

## 2014-06-29 DIAGNOSIS — H35372 Puckering of macula, left eye: Secondary | ICD-10-CM | POA: Diagnosis not present

## 2014-06-29 DIAGNOSIS — H40042 Steroid responder, left eye: Secondary | ICD-10-CM | POA: Diagnosis not present

## 2014-06-29 DIAGNOSIS — H43811 Vitreous degeneration, right eye: Secondary | ICD-10-CM | POA: Diagnosis not present

## 2014-06-29 DIAGNOSIS — H35352 Cystoid macular degeneration, left eye: Secondary | ICD-10-CM | POA: Diagnosis not present

## 2014-06-29 DIAGNOSIS — H2513 Age-related nuclear cataract, bilateral: Secondary | ICD-10-CM | POA: Diagnosis not present

## 2014-06-30 DIAGNOSIS — M25631 Stiffness of right wrist, not elsewhere classified: Secondary | ICD-10-CM | POA: Diagnosis not present

## 2014-06-30 DIAGNOSIS — M25431 Effusion, right wrist: Secondary | ICD-10-CM | POA: Diagnosis not present

## 2014-06-30 DIAGNOSIS — M24541 Contracture, right hand: Secondary | ICD-10-CM | POA: Diagnosis not present

## 2014-06-30 DIAGNOSIS — M25531 Pain in right wrist: Secondary | ICD-10-CM | POA: Diagnosis not present

## 2014-07-03 DIAGNOSIS — M25631 Stiffness of right wrist, not elsewhere classified: Secondary | ICD-10-CM | POA: Diagnosis not present

## 2014-07-03 DIAGNOSIS — M25431 Effusion, right wrist: Secondary | ICD-10-CM | POA: Diagnosis not present

## 2014-07-03 DIAGNOSIS — M25531 Pain in right wrist: Secondary | ICD-10-CM | POA: Diagnosis not present

## 2014-07-03 DIAGNOSIS — M24541 Contracture, right hand: Secondary | ICD-10-CM | POA: Diagnosis not present

## 2014-07-13 DIAGNOSIS — M25531 Pain in right wrist: Secondary | ICD-10-CM | POA: Diagnosis not present

## 2014-07-13 DIAGNOSIS — M25431 Effusion, right wrist: Secondary | ICD-10-CM | POA: Diagnosis not present

## 2014-07-13 DIAGNOSIS — M25631 Stiffness of right wrist, not elsewhere classified: Secondary | ICD-10-CM | POA: Diagnosis not present

## 2014-07-13 DIAGNOSIS — M24541 Contracture, right hand: Secondary | ICD-10-CM | POA: Diagnosis not present

## 2014-07-20 DIAGNOSIS — M25631 Stiffness of right wrist, not elsewhere classified: Secondary | ICD-10-CM | POA: Diagnosis not present

## 2014-07-20 DIAGNOSIS — M24541 Contracture, right hand: Secondary | ICD-10-CM | POA: Diagnosis not present

## 2014-07-20 DIAGNOSIS — M25531 Pain in right wrist: Secondary | ICD-10-CM | POA: Diagnosis not present

## 2014-07-20 DIAGNOSIS — M25431 Effusion, right wrist: Secondary | ICD-10-CM | POA: Diagnosis not present

## 2014-08-25 ENCOUNTER — Telehealth: Payer: Self-pay | Admitting: Internal Medicine

## 2014-08-25 DIAGNOSIS — M8580 Other specified disorders of bone density and structure, unspecified site: Secondary | ICD-10-CM

## 2014-08-25 NOTE — Telephone Encounter (Signed)
Nevermind, I was able to extend the order longer. Please ignore

## 2014-08-25 NOTE — Telephone Encounter (Signed)
Pt is going to call and get dexa scheduled but osteopenia (m85.80) is not covered under medicare and i can not get it to sign. The order for bone density you put in last year has expired so a new order has to be placed.

## 2014-08-25 NOTE — Telephone Encounter (Signed)
-----   Message from Rita Ohara, MD sent at 08/24/2014 11:12 PM EDT ----- Regarding: past due/overdue order for DEXA Please contact pt and remind her to schedule DEXA (and extend order) ----- Message -----    From: SYSTEM    Sent: 08/05/2014  12:05 AM      To: Rita Ohara, MD

## 2014-09-17 DIAGNOSIS — Z803 Family history of malignant neoplasm of breast: Secondary | ICD-10-CM | POA: Diagnosis not present

## 2014-09-17 DIAGNOSIS — Z1231 Encounter for screening mammogram for malignant neoplasm of breast: Secondary | ICD-10-CM | POA: Diagnosis not present

## 2014-09-17 DIAGNOSIS — M8580 Other specified disorders of bone density and structure, unspecified site: Secondary | ICD-10-CM | POA: Diagnosis not present

## 2014-09-17 LAB — HM MAMMOGRAPHY

## 2014-09-18 ENCOUNTER — Encounter: Payer: Self-pay | Admitting: Internal Medicine

## 2014-09-23 ENCOUNTER — Encounter (INDEPENDENT_AMBULATORY_CARE_PROVIDER_SITE_OTHER): Payer: BLUE CROSS/BLUE SHIELD | Admitting: Ophthalmology

## 2014-09-28 ENCOUNTER — Encounter (INDEPENDENT_AMBULATORY_CARE_PROVIDER_SITE_OTHER): Payer: Medicare Other | Admitting: Ophthalmology

## 2014-09-28 DIAGNOSIS — H2513 Age-related nuclear cataract, bilateral: Secondary | ICD-10-CM | POA: Diagnosis not present

## 2014-09-28 DIAGNOSIS — H43813 Vitreous degeneration, bilateral: Secondary | ICD-10-CM | POA: Diagnosis not present

## 2014-09-28 DIAGNOSIS — H35372 Puckering of macula, left eye: Secondary | ICD-10-CM

## 2014-09-28 DIAGNOSIS — H338 Other retinal detachments: Secondary | ICD-10-CM

## 2014-10-01 ENCOUNTER — Telehealth: Payer: Self-pay | Admitting: *Deleted

## 2014-10-01 NOTE — Telephone Encounter (Signed)
Called patient to give her DEXA results:unchanged since last in 2013 and to continue on alendronate and recheck in 2-3 years. She said that she recently read an article about bisphophonates and that they are not good to be on for more than 3-4 years at a time. Any thoughts on this? Or does she need OV to discuss? Please advise. Thanks.

## 2014-10-01 NOTE — Telephone Encounter (Signed)
Patient advised and she is already scheduled for AWV/Med check plus for 11/04/14.

## 2014-10-01 NOTE — Telephone Encounter (Signed)
Per my records, she has been on alendronate since 02/2012.  I have been keeping people on it for 5 years and then stopping.  She should continue. It appears she is past due for medcheck+/AWV (last done 05/2013).  Please schedule (next available is okay)

## 2014-10-14 ENCOUNTER — Encounter: Payer: Self-pay | Admitting: Family Medicine

## 2014-11-04 ENCOUNTER — Encounter: Payer: Self-pay | Admitting: Family Medicine

## 2014-11-04 ENCOUNTER — Ambulatory Visit (INDEPENDENT_AMBULATORY_CARE_PROVIDER_SITE_OTHER): Payer: Medicare Other | Admitting: Family Medicine

## 2014-11-04 VITALS — BP 120/72 | HR 68 | Ht 69.5 in | Wt 167.2 lb

## 2014-11-04 DIAGNOSIS — M8580 Other specified disorders of bone density and structure, unspecified site: Secondary | ICD-10-CM | POA: Diagnosis not present

## 2014-11-04 DIAGNOSIS — Z1211 Encounter for screening for malignant neoplasm of colon: Secondary | ICD-10-CM | POA: Diagnosis not present

## 2014-11-04 DIAGNOSIS — Z01419 Encounter for gynecological examination (general) (routine) without abnormal findings: Secondary | ICD-10-CM | POA: Diagnosis not present

## 2014-11-04 DIAGNOSIS — Z0000 Encounter for general adult medical examination without abnormal findings: Secondary | ICD-10-CM

## 2014-11-04 DIAGNOSIS — E785 Hyperlipidemia, unspecified: Secondary | ICD-10-CM

## 2014-11-04 DIAGNOSIS — Z5181 Encounter for therapeutic drug level monitoring: Secondary | ICD-10-CM

## 2014-11-04 DIAGNOSIS — H33002 Unspecified retinal detachment with retinal break, left eye: Secondary | ICD-10-CM | POA: Diagnosis not present

## 2014-11-04 LAB — CBC WITH DIFFERENTIAL/PLATELET
Basophils Absolute: 0.1 10*3/uL (ref 0.0–0.1)
Basophils Relative: 1 % (ref 0–1)
EOS PCT: 2 % (ref 0–5)
Eosinophils Absolute: 0.1 10*3/uL (ref 0.0–0.7)
HEMATOCRIT: 39.7 % (ref 36.0–46.0)
Hemoglobin: 13.1 g/dL (ref 12.0–15.0)
Lymphocytes Relative: 32 % (ref 12–46)
Lymphs Abs: 2 10*3/uL (ref 0.7–4.0)
MCH: 29.1 pg (ref 26.0–34.0)
MCHC: 33 g/dL (ref 30.0–36.0)
MCV: 88.2 fL (ref 78.0–100.0)
MPV: 10.3 fL (ref 8.6–12.4)
Monocytes Absolute: 0.6 10*3/uL (ref 0.1–1.0)
Monocytes Relative: 10 % (ref 3–12)
Neutro Abs: 3.5 10*3/uL (ref 1.7–7.7)
Neutrophils Relative %: 55 % (ref 43–77)
PLATELETS: 289 10*3/uL (ref 150–400)
RBC: 4.5 MIL/uL (ref 3.87–5.11)
RDW: 13.5 % (ref 11.5–15.5)
WBC: 6.4 10*3/uL (ref 4.0–10.5)

## 2014-11-04 LAB — COMPREHENSIVE METABOLIC PANEL
ALT: 11 U/L (ref 6–29)
AST: 15 U/L (ref 10–35)
Albumin: 4.3 g/dL (ref 3.6–5.1)
Alkaline Phosphatase: 46 U/L (ref 33–130)
BILIRUBIN TOTAL: 0.5 mg/dL (ref 0.2–1.2)
BUN: 14 mg/dL (ref 7–25)
CO2: 30 mmol/L (ref 20–31)
Calcium: 9.9 mg/dL (ref 8.6–10.4)
Chloride: 102 mmol/L (ref 98–110)
Creat: 0.65 mg/dL (ref 0.60–0.93)
GLUCOSE: 90 mg/dL (ref 65–99)
POTASSIUM: 4.4 mmol/L (ref 3.5–5.3)
SODIUM: 140 mmol/L (ref 135–146)
Total Protein: 6.9 g/dL (ref 6.1–8.1)

## 2014-11-04 LAB — LIPID PANEL
CHOL/HDL RATIO: 3.1 ratio (ref <=5.0)
Cholesterol: 224 mg/dL — ABNORMAL HIGH (ref 125–200)
HDL: 73 mg/dL (ref >=46)
LDL Cholesterol: 133 mg/dL — ABNORMAL HIGH (ref <130)
Triglycerides: 89 mg/dL (ref <150)
VLDL: 18 mg/dL (ref <30)

## 2014-11-04 LAB — HEMOCCULT GUIAC POC 1CARD (OFFICE): Fecal Occult Blood, POC: NEGATIVE

## 2014-11-04 MED ORDER — ALENDRONATE SODIUM 70 MG PO TABS: ORAL_TABLET | ORAL | Status: DC

## 2014-11-04 NOTE — Progress Notes (Signed)
Chief Complaint  Patient presents with  . Med check plus    fasting med check plus with pelvic exam. Did have fall in Dec 2015 and broke right wrist and then detached retina 10 days later. No concerns.   Joan Morales is a 74 y.o. female who presents for annual wellness visit and follow-up on chronic medical conditions.   Osteopenia: She has been on fosamax since 02/2012. She denies any significant side effects--just notes occasional heartburn which she has sorted out that it is diet related rather than from medications. Denies dysphagia. Last DEXA was 08/2014, T-2.2 at R fem neck.  Last seen here 11/2013 with abdominal pain--not treated with any antibiotics, just high fiber diet (and some dietary changes) and pain resolved.  She stopped eating nuts, seeds and corn, and only sporadically has some short-lived discomfort.  Denies any blood in the stool, fevers.  Immunization History  Administered Date(s) Administered  . Influenza Split 01/04/2012  . Influenza, High Dose Seasonal PF 12/15/2013  . Pneumococcal Conjugate-13 06/04/2013  . Pneumococcal Polysaccharide-23 09/17/2008  . Tdap 05/07/2005  she didn't get the shingles vaccine last year (was given the prescription to get it). Last Pap smear: s/p hysterectomy  Last mammogram: 08/2014 Last colonoscopy: 2010  Last DEXA: 08/2014  Dentist: twice yearly for cleanings, as well as some dental work recently Ophtho: frequently since December (for detached retina) Exercise: walking 5 days/week, 20-30 minutes.  No weight-bearing exercise.  Other doctors caring for patient include: Dermatologist (Dr. Rolm Bookbinder) Dentist: Dr. Moss Mc Ortho: Dr. Percell Miller (for her wrist). Retina specialist: Dr. Zigmund Daniel Ophtho: has not yet established with one  Depression screen:  Negative (see screen in epic). Fall screen: 1 fall in the last year, which resulted in wrist fracture, and 10 days later retinal detachment.  This occurred when she was climbing  on top of her coffee table (to try and throw a box over the bannister)--fell when getting up on the table, onto her wrist. ADL screen:  See screen in epic.  Notable only for trouble with stairs due to right knee pain from arthritis  End of Life Discussion:  Patient has a living will and medical power of attorney  Past Medical History  Diagnosis Date  . Allergic rhinitis, cause unspecified   . Ovarian teratoma     left, removed 12/06  . Osteopenia     fosamax started 01/2012  . Pure hypercholesterolemia   . PONV (postoperative nausea and vomiting)   . GERD (gastroesophageal reflux disease)   . Arthritis     "hands" (03/10/2014)    Past Surgical History  Procedure Laterality Date  . Total abdominal hysterectomy w/ bilateral salpingoophorectomy  12/06    L ovarian teratoma  . Tonsillectomy    . Wrist fracture surgery Right 02/26/14    plate and screws  . Scleral buckle Left 03/10/2014  . Total abdominal hysterectomy  02/2005    w/BSO; L ovarian teratoma  . Fracture surgery    . Eye surgery    . Scleral buckle with cryo Left 03/10/2014    Procedure: SCLERAL BUCKLE WITH CRYO AND DIATHERMY;  Surgeon: Hayden Pedro, MD;  Location: [facility];  Service: Ophthalmology;  Laterality: Left;  . Laser photo ablation Left 03/10/2014    Procedure: LASER PHOTO ABLATION-HEADSCOPE LASER;  Surgeon: Hayden Pedro, MD;  Location: [facility];  Service: Ophthalmology;  Laterality: Left;  . Gas insertion Left 03/10/2014    Procedure: INSERTION OF GAS-C3F8;  Surgeon: Hayden Pedro,  MD;  Location: [facility];  Service: Ophthalmology;  Laterality: Left;    Social History   Social History  . Marital Status: Married    Spouse Name: N/A  . Number of Children: 1  . Years of Education: N/A   Occupational History  . retired    Social History Main Topics  . Smoking status: Never Smoker   . Smokeless tobacco: Never Used  . Alcohol Use: No  . Drug Use: No  . Sexual Activity:    Partners: Male     Comment:  husband with impotence issues   Other Topics Concern  . Not on file   Social History Narrative   Married, lives with husband.  Son lives in [place], [place].  2 grandchildren.  Retired (2013; Engineer, materials).    Family History  Problem Relation Age of Onset  . Heart disease Mother   . COPD Father     smoker  . Fibromyalgia Sister   . Irritable bowel syndrome Sister   . Diabetes Paternal Aunt   . Breast cancer Maternal Grandmother     50's  . Colon cancer Neg Hx     Outpatient Encounter Prescriptions as of 11/04/2014  Medication Sig Note  . alendronate (FOSAMAX) 70 MG tablet TAKE ONE TABLET BY MOUTH EVERY 7 DAYS WITH A FULL GLASS OF WATER ON AN EMPTY STOMACH   . Ascorbic Acid (VITAMIN C) 1000 MG tablet Take 1,000 mg by mouth 2 (two) times daily.    . Calcium Carbonate-Vitamin D (RA CALCIUM PLUS VITAMIN D) 600-400 MG-UNIT per tablet Take 1 tablet by mouth daily.    . Glucosamine HCl 1000 MG TABS Take 1,000 mg by mouth 2 (two) times daily.   . ILEVRO 0.3 % ophthalmic suspension Place 1 drop into the left eye daily.  11/04/2014: Received from: External Pharmacy  . Omega-3 Fatty Acids (FISH OIL) 1200 MG CAPS Take 1 capsule by mouth 2 (two) times daily.    . prednisoLONE acetate (PRED FORTE) 1 % ophthalmic suspension Place 1 drop into the left eye 4 (four) times daily.   . vitamin E 400 UNIT capsule Take 400 Units by mouth daily.   . [DISCONTINUED] alendronate (FOSAMAX) 70 MG tablet TAKE ONE TABLET BY MOUTH EVERY 7 DAYS WITH A FULL GLASS OF WATER ON AN EMPTY STOMACH   . [DISCONTINUED] bacitracin-polymyxin b (POLYSPORIN) ophthalmic ointment Place 1 application into the left eye 3 (three) times daily. apply to eye every 8 hours while awake   . [DISCONTINUED] brimonidine (ALPHAGAN) 0.2 % ophthalmic solution Place 1 drop into the left eye 2 (two) times daily.   . [DISCONTINUED] gatifloxacin (ZYMAXID) 0.5 % SOLN Place 1 drop into the left eye 4 (four) times daily.   . [DISCONTINUED]  nitrofurantoin, macrocrystal-monohydrate, (MACROBID) 100 MG capsule Take 1 capsule (100 mg total) by mouth 2 (two) times daily. (Patient not taking: Reported on 03/09/2014)    No facility-administered encounter medications on file as of 11/04/2014.    Allergies  Allergen Reactions  . Erythromycin Swelling  . Triple Antibiotic [Bacitracin-Neomycin-Polymyxin] Swelling    Blistering   ROS: The patient denies anorexia, fever, weight changes, headaches, decreased hearing, ear pain, sore throat, breast concerns, chest pain, palpitations, dizziness, syncope, dyspnea on exertion, cough, swelling, nausea, vomiting, diarrhea, constipation, abdominal pain, melena, hematochezia, incontinence, dysuria, vaginal bleeding, discharge, odor or itch, genital lesions, joint pains, numbness, tingling, weakness, tremor, suspicious skin lesions, depression, anxiety, abnormal bleeding/bruising, or enlarged lymph nodes.  Mild seasonal allergies, relieved by chlortrimeton  as needed, usually in the Spring. Occasional heartburn, uses pepcid as needed (less frequent than in the past). Arthritis in hands/fingers Blurred vision in left eye; will need cataract surgery (and Dr. Rodena Piety will be referring her). Some right knee pain, noticed only when going up stairs.  PHYSICAL EXAM:  BP 120/72 mmHg  Pulse 68  Ht 5' 9.5" (1.765 m)  Wt 167 lb 3.2 oz (75.841 kg)  BMI 24.35 kg/m2  General Appearance:  Alert, cooperative, no distress, appears stated age   Head:  Normocephalic, without obvious abnormality, atraumatic   Eyes:  PERRL, conjunctiva/corneas clear, EOM's intact, fundi not visualized.  Mild swelling of the left eye (soft tissue).  Ears:  Normal TM's and external ear canals   Nose:  Nares normal, mucosa mild-mod edematous, no drainage or sinus tenderness   Throat:  Lips, mucosa, and tongue normal; teeth and gums normal   Neck:  Supple, no lymphadenopathy; thyroid: no enlargement/tenderness/nodules; no  carotid  bruit or JVD   Back:  Spine nontender, no curvature, ROM normal, no CVA tenderness   Lungs:  Clear to auscultation bilaterally without wheezes, rales or ronchi; respirations unlabored   Chest Wall:  No tenderness or deformity. Prominent manubrium, nontender  Heart:  Regular rate and rhythm, S1 and S2 normal, no murmur, rub  or gallop   Breast Exam:  No tenderness, masses, or nipple discharge or inversion. No axillary lymphadenopathy   Abdomen:  Soft, non-tender, nondistended, normoactive bowel sounds,  no masses, no hepatosplenomegaly   Genitalia:  Normal external genitalia without lesions, mild atrophic changes. BUS and vagina normal; bimanual exam normal--no pelvic masses appreciable, no tenderness. Pap not performed   Rectal:  Normal tone, no masses or tenderness; guaiac negative stool   Extremities:  No clubbing, cyanosis or edema. Bilateral bunions, hammertoes and callouses. Onychomycotic, thickened nails. Arthritic deformity to DIP's in handsbilaterally  Pulses:  2+ and symmetric all extremities   Skin:  Skin color, texture, turgor normal, no rashes or lesions   Lymph nodes:  Cervical, supraclavicular, and axillary nodes normal   Neurologic:  CNII-XII intact, normal strength, sensation and gait; reflexes 2+ and symmetric throughout    Psych: Normal mood, affect, hygiene and grooming        ASSESSMENT/PLAN:  Medicare annual wellness visit, subsequent  Osteopenia - continue fosamax, calcium, D, weight-bearing exercise. repeat DEXA 08/2016 - Plan: alendronate (FOSAMAX) 70 MG tablet, TSH  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  Hyperlipidemia - Plan: Lipid panel  Medication monitoring encounter - Plan: TSH, Comprehensive metabolic panel, Lipid panel, CBC with Differential/Platelet  Encounter for routine gynecological examination   C-met, TSH, CBC, lipid recommended. Patient states she is happy to  pay for her labs that may not be covered by insurance, denies any fatigue. (TSH will not be covered, patient aware)  Bunions and Hammertoes: Flat and wide shoes recommended. Do not recommend surgery since asymptomatic when proper shoes are worn; consider podiatry consult. Onychomycosis--asymptomatic, treatment not recommended (other than clipping toenails).   Discussed monthly self breast exams and yearly mammograms; at least 30 minutes of aerobic activity at least 5 days/week, weight-bearing exercise at least 2x/wk; proper sunscreen use reviewed; healthy diet, including goals of calcium and vitamin D intake and alcohol recommendations (less than or equal to 1 drink/day) reviewed; regular seatbelt use; changing batteries in smoke detectors. Immunization recommendations discussed--high dose flu shots recommended yearly (not yet available); Shingles vaccine recommended, risks reviewed. Colonoscopy recommendations reviewed, UTD.  MOST form reviewed and updated/signed. Full code,  full care, no prolonged IV fluids/tube feeds  Medicare Attestation I have personally reviewed: The patient's medical and social history Their use of alcohol, tobacco or illicit drugs Their current medications and supplements The patient's functional ability including ADLs,fall risks, home safety risks, cognitive, and hearing and visual impairment Diet and physical activities Evidence for depression or mood disorders  The patient's weight, height, BMI, and visual acuity have been recorded in the chart.  I have made referrals, counseling, and provided education to the patient based on review of the above and I have provided the patient with a written personalized care plan for preventive services.     Mesha Schamberger A, MD   11/04/2014

## 2014-11-04 NOTE — Patient Instructions (Addendum)
HEALTH MAINTENANCE RECOMMENDATIONS:  It is recommended that you get at least 30 minutes of aerobic exercise at least 5 days/week (for weight loss, you may need as much as 60-90 minutes). This can be any activity that gets your heart rate up. This can be divided in 10-15 minute intervals if needed, but try and build up your endurance at least once a week.  Weight bearing exercise is also recommended twice weekly.  Eat a healthy diet with lots of vegetables, fruits and fiber.  "Colorful" foods have a lot of vitamins (ie green vegetables, tomatoes, red peppers, etc).  Limit sweet tea, regular sodas and alcoholic beverages, all of which has a lot of calories and sugar.  Up to 1 alcoholic drink daily may be beneficial for women (unless trying to lose weight, watch sugars).  Drink a lot of water.  Calcium recommendations are 1200-1500 mg daily (1500 mg for postmenopausal women or women without ovaries), and vitamin D 1000 IU daily.  This should be obtained from diet and/or supplements (vitamins), and calcium should not be taken all at once, but in divided doses.  Monthly self breast exams and yearly mammograms for women over the age of 4 is recommended.  Sunscreen of at least SPF 30 should be used on all sun-exposed parts of the skin when outside between the hours of 10 am and 4 pm (not just when at beach or pool, but even with exercise, golf, tennis, and yard work!)  Use a sunscreen that says "broad spectrum" so it covers both UVA and UVB rays, and make sure to reapply every 1-2 hours.  Remember to change the batteries in your smoke detectors when changing your clock times in the spring and fall.  Use your seat belt every time you are in a car, and please drive safely and not be distracted with cell phones and texting while driving.  Please add in weight-bearing exercise twice a week.  Consider a Silver Sneakers program.  Please get your shingles vaccine from the pharmacy.  Remember to wait 30 days  afterwards before getting your flu shot (you can get them the same day, if the pharmacy carries the high dose flu shot; if they don't, then getyour flu shot here).  Please let us know the date you got the shingles vaccine so we can enter it in your chart.  You will need a Td (tetanus booster) next year (technically due 04/2015).  Medicare does not cover this to be given in the office.  You may have it covered (possibly) if you get it from a pharmacy.   Ms. Dahle , Thank you for taking time to come for your Medicare Wellness Visit. I appreciate your ongoing commitment to your health goals. Please review the following plan we discussed and let me know if I can assist you in the future.   These are the goals we discussed: Goals    None      This is a list of the screening recommended for you and due dates:  Health Maintenance  Topic Date Due  . Shingles Vaccine  10/15/2001  . Flu Shot  10/19/2014  . Tetanus Vaccine  05/08/2015  . Mammogram  09/16/2016  . Colon Cancer Screening  09/03/2018  . DEXA scan (bone density measurement)  Completed  . Pneumonia vaccines  Completed   Shingles vaccine--see above, and get from the pharmacy Flu shot--this season (September/October, get at least 30 days after shingles vaccine if not given the same day) You can  get mammograms yearly (08/2015) Bone density test will be due again in 08/2016 (2 years from the last)  Consider seeing a podiatrist to help with your foot pain, and trimming nails, callouses.

## 2014-11-05 ENCOUNTER — Encounter: Payer: Self-pay | Admitting: Family Medicine

## 2014-11-05 LAB — TSH: TSH: 1.633 u[IU]/mL (ref 0.350–4.500)

## 2014-11-09 ENCOUNTER — Encounter (INDEPENDENT_AMBULATORY_CARE_PROVIDER_SITE_OTHER): Payer: Medicare Other | Admitting: Ophthalmology

## 2014-11-09 DIAGNOSIS — H59032 Cystoid macular edema following cataract surgery, left eye: Secondary | ICD-10-CM | POA: Diagnosis not present

## 2014-11-09 DIAGNOSIS — H2513 Age-related nuclear cataract, bilateral: Secondary | ICD-10-CM | POA: Diagnosis not present

## 2014-11-09 DIAGNOSIS — H43813 Vitreous degeneration, bilateral: Secondary | ICD-10-CM

## 2014-11-09 DIAGNOSIS — H35372 Puckering of macula, left eye: Secondary | ICD-10-CM

## 2014-11-09 DIAGNOSIS — H338 Other retinal detachments: Secondary | ICD-10-CM

## 2014-11-20 DIAGNOSIS — H40042 Steroid responder, left eye: Secondary | ICD-10-CM | POA: Diagnosis not present

## 2014-11-20 DIAGNOSIS — H2513 Age-related nuclear cataract, bilateral: Secondary | ICD-10-CM | POA: Diagnosis not present

## 2014-12-21 ENCOUNTER — Encounter (INDEPENDENT_AMBULATORY_CARE_PROVIDER_SITE_OTHER): Payer: Medicare Other | Admitting: Ophthalmology

## 2014-12-21 DIAGNOSIS — H2513 Age-related nuclear cataract, bilateral: Secondary | ICD-10-CM | POA: Diagnosis not present

## 2014-12-21 DIAGNOSIS — H35372 Puckering of macula, left eye: Secondary | ICD-10-CM

## 2014-12-21 DIAGNOSIS — H338 Other retinal detachments: Secondary | ICD-10-CM | POA: Diagnosis not present

## 2014-12-21 DIAGNOSIS — H59032 Cystoid macular edema following cataract surgery, left eye: Secondary | ICD-10-CM | POA: Diagnosis not present

## 2014-12-21 DIAGNOSIS — H43813 Vitreous degeneration, bilateral: Secondary | ICD-10-CM

## 2015-01-08 DIAGNOSIS — H40042 Steroid responder, left eye: Secondary | ICD-10-CM | POA: Diagnosis not present

## 2015-01-08 DIAGNOSIS — H2513 Age-related nuclear cataract, bilateral: Secondary | ICD-10-CM | POA: Diagnosis not present

## 2015-01-15 DIAGNOSIS — H10021 Other mucopurulent conjunctivitis, right eye: Secondary | ICD-10-CM | POA: Diagnosis not present

## 2015-01-20 DIAGNOSIS — H10023 Other mucopurulent conjunctivitis, bilateral: Secondary | ICD-10-CM | POA: Diagnosis not present

## 2015-02-26 ENCOUNTER — Other Ambulatory Visit (INDEPENDENT_AMBULATORY_CARE_PROVIDER_SITE_OTHER): Payer: Medicare Other

## 2015-02-26 DIAGNOSIS — Z23 Encounter for immunization: Secondary | ICD-10-CM

## 2015-03-29 ENCOUNTER — Encounter (INDEPENDENT_AMBULATORY_CARE_PROVIDER_SITE_OTHER): Payer: BLUE CROSS/BLUE SHIELD | Admitting: Ophthalmology

## 2015-04-26 ENCOUNTER — Encounter (INDEPENDENT_AMBULATORY_CARE_PROVIDER_SITE_OTHER): Payer: Medicare Other | Admitting: Ophthalmology

## 2015-04-26 DIAGNOSIS — H43813 Vitreous degeneration, bilateral: Secondary | ICD-10-CM

## 2015-04-26 DIAGNOSIS — H35372 Puckering of macula, left eye: Secondary | ICD-10-CM | POA: Diagnosis not present

## 2015-04-26 DIAGNOSIS — H59032 Cystoid macular edema following cataract surgery, left eye: Secondary | ICD-10-CM

## 2015-04-26 DIAGNOSIS — H338 Other retinal detachments: Secondary | ICD-10-CM

## 2015-04-26 DIAGNOSIS — H2513 Age-related nuclear cataract, bilateral: Secondary | ICD-10-CM

## 2015-04-30 DIAGNOSIS — H2513 Age-related nuclear cataract, bilateral: Secondary | ICD-10-CM | POA: Diagnosis not present

## 2015-04-30 DIAGNOSIS — H35372 Puckering of macula, left eye: Secondary | ICD-10-CM | POA: Diagnosis not present

## 2015-04-30 DIAGNOSIS — H43811 Vitreous degeneration, right eye: Secondary | ICD-10-CM | POA: Diagnosis not present

## 2015-04-30 DIAGNOSIS — H35352 Cystoid macular degeneration, left eye: Secondary | ICD-10-CM | POA: Diagnosis not present

## 2015-05-14 DIAGNOSIS — H40052 Ocular hypertension, left eye: Secondary | ICD-10-CM | POA: Diagnosis not present

## 2015-05-14 DIAGNOSIS — H2513 Age-related nuclear cataract, bilateral: Secondary | ICD-10-CM | POA: Diagnosis not present

## 2015-06-07 DIAGNOSIS — H35352 Cystoid macular degeneration, left eye: Secondary | ICD-10-CM | POA: Diagnosis not present

## 2015-06-07 DIAGNOSIS — H40052 Ocular hypertension, left eye: Secondary | ICD-10-CM | POA: Diagnosis not present

## 2015-06-07 DIAGNOSIS — H43811 Vitreous degeneration, right eye: Secondary | ICD-10-CM | POA: Diagnosis not present

## 2015-06-07 DIAGNOSIS — H35372 Puckering of macula, left eye: Secondary | ICD-10-CM | POA: Diagnosis not present

## 2015-06-07 DIAGNOSIS — H2513 Age-related nuclear cataract, bilateral: Secondary | ICD-10-CM | POA: Diagnosis not present

## 2015-09-23 ENCOUNTER — Encounter: Payer: Self-pay | Admitting: Family Medicine

## 2015-09-23 DIAGNOSIS — Z1231 Encounter for screening mammogram for malignant neoplasm of breast: Secondary | ICD-10-CM | POA: Diagnosis not present

## 2015-09-23 LAB — HM MAMMOGRAPHY

## 2015-10-11 DIAGNOSIS — H2513 Age-related nuclear cataract, bilateral: Secondary | ICD-10-CM | POA: Diagnosis not present

## 2015-10-11 DIAGNOSIS — H40052 Ocular hypertension, left eye: Secondary | ICD-10-CM | POA: Diagnosis not present

## 2015-10-11 DIAGNOSIS — H35352 Cystoid macular degeneration, left eye: Secondary | ICD-10-CM | POA: Diagnosis not present

## 2015-10-11 DIAGNOSIS — H35372 Puckering of macula, left eye: Secondary | ICD-10-CM | POA: Diagnosis not present

## 2015-10-11 DIAGNOSIS — H43811 Vitreous degeneration, right eye: Secondary | ICD-10-CM | POA: Diagnosis not present

## 2015-10-21 ENCOUNTER — Encounter: Payer: Self-pay | Admitting: *Deleted

## 2015-11-01 ENCOUNTER — Ambulatory Visit (INDEPENDENT_AMBULATORY_CARE_PROVIDER_SITE_OTHER): Payer: Medicare Other | Admitting: Ophthalmology

## 2015-11-01 DIAGNOSIS — H2513 Age-related nuclear cataract, bilateral: Secondary | ICD-10-CM | POA: Diagnosis not present

## 2015-11-01 DIAGNOSIS — H338 Other retinal detachments: Secondary | ICD-10-CM | POA: Diagnosis not present

## 2015-11-01 DIAGNOSIS — H43813 Vitreous degeneration, bilateral: Secondary | ICD-10-CM

## 2015-11-01 DIAGNOSIS — H59032 Cystoid macular edema following cataract surgery, left eye: Secondary | ICD-10-CM

## 2015-11-01 DIAGNOSIS — H35372 Puckering of macula, left eye: Secondary | ICD-10-CM | POA: Diagnosis not present

## 2015-11-17 ENCOUNTER — Telehealth: Payer: Self-pay | Admitting: Family Medicine

## 2015-11-17 DIAGNOSIS — M8580 Other specified disorders of bone density and structure, unspecified site: Secondary | ICD-10-CM

## 2015-11-17 MED ORDER — ALENDRONATE SODIUM 70 MG PO TABS
ORAL_TABLET | ORAL | 0 refills | Status: DC
Start: 2015-11-17 — End: 2015-12-16

## 2015-11-17 NOTE — Telephone Encounter (Signed)
Pt needs refill Alendronate to [facility].  Pt has Medck+ scheduled 12/08/15

## 2015-11-17 NOTE — Telephone Encounter (Signed)
Done x 1 mo

## 2015-11-19 HISTORY — PX: CATARACT EXTRACTION: SUR2

## 2015-12-06 NOTE — Progress Notes (Deleted)
Joan Morales is a 75 y.o. female who presents for annual wellness visit and follow-up on chronic medical conditions.  She has the following concerns:  Osteopenia: She has been on fosamax since 02/2012. She denies any significant side effects--just notes occasional heartburn which she has sorted out that it is diet related rather than from medications. Denies dysphagia. Last DEXA was 08/2014, T-2.2 at [facility].    Immunization History  Administered Date(s) Administered  . Influenza Split 01/04/2012  . Influenza, High Dose Seasonal PF 12/15/2013, 02/26/2015  . Pneumococcal Conjugate-13 06/04/2013  . Pneumococcal Polysaccharide-23 09/17/2008  . Td 10/20/2015  . Tdap 05/07/2005  she didn't get the shingles vaccine last year (was given the prescription to get it). Last Pap smear: s/p hysterectomy  Last mammogram:09/2015 Last colonoscopy: 2010  Last DEXA: 08/2014  Dentist: twice yearly for cleanings, as well as some dental work recently Ophtho: frequently since December (for detached retina) Exercise: walking 5 days/week, 20-30 minutes.  No weight-bearing exercise.  Other doctors caring for patient include: Dermatologist (Dr. Rolm Bookbinder) Dentist: Dr. Moss Mc Ortho: Dr. Percell Miller (for her wrist). Retina specialist: Dr. Zigmund Daniel Ophtho: has not yet established with one  Depression screen:  Negative (see screen in epic). Fall screen: 1 fall in the last year, which resulted in wrist fracture, and 10 days later retinal detachment.  This occurred when she was climbing on top of her coffee table (to try and throw a box over the bannister)--fell when getting up on the table, onto her wrist. ADL screen:  See screen in epic.  Notable only for trouble with stairs due to right knee pain from arthritis  End of Life Discussion:  Patient has a living will and medical power of attorney     ROS: The patient denies anorexia, fever, weight changes, headaches, decreased hearing, ear  pain, sore throat, breast concerns, chest pain, palpitations, dizziness, syncope, dyspnea on exertion, cough, swelling, nausea, vomiting, diarrhea, constipation, abdominal pain, melena, hematochezia, incontinence, dysuria, vaginal bleeding, discharge, odor or itch, genital lesions, joint pains, numbness, tingling, weakness, tremor, suspicious skin lesions, depression, anxiety, abnormal bleeding/bruising, or enlarged lymph nodes.  Mild seasonal allergies, relieved by chlortrimeton as needed, usually in the Spring. Occasional heartburn, uses pepcid as needed (less frequent than in the past). Arthritis in hands/fingers Blurred vision in left eye; will need cataract surgery (and Dr. Rodena Piety will be referring her). Some right knee pain, noticed only when going up stairs.    PHYSICAL EXAM:  Wt 167 last year  General Appearance:  Alert, cooperative, no distress, appears stated age   Head:  Normocephalic, without obvious abnormality, atraumatic   Eyes:  PERRL, conjunctiva/corneas clear, EOM's intact, fundi not visualized.  Mild swelling of the left eye (soft tissue).  Ears:  Normal TM's and external ear canals   Nose:  Nares normal, mucosa mild-mod edematous, no drainage or sinus tenderness   Throat:  Lips, mucosa, and tongue normal; teeth and gums normal   Neck:  Supple, no lymphadenopathy; thyroid: no enlargement/tenderness/nodules; no carotid  bruit or JVD   Back:  Spine nontender, no curvature, ROM normal, no CVA tenderness   Lungs:  Clear to auscultation bilaterally without wheezes, rales or ronchi; respirations unlabored   Chest Wall:  No tenderness or deformity. Prominent manubrium, nontender  Heart:  Regular rate and rhythm, S1 and S2 normal, no murmur, rub  or gallop   Breast Exam:  No tenderness, masses, or nipple discharge or inversion. No axillary lymphadenopathy   Abdomen:  Soft, non-tender, nondistended, normoactive bowel sounds,  no masses, no  hepatosplenomegaly   Genitalia:  Normal external genitalia without lesions, mild atrophic changes. BUS and vagina normal; bimanual exam normal--no pelvic masses appreciable, no tenderness. Pap not performed   Rectal:  Normal tone, no masses or tenderness; guaiac negative stool   Extremities:  No clubbing, cyanosis or edema. Bilateral bunions, hammertoes and callouses. Onychomycotic, thickened nails. Arthritic deformity to DIP's in handsbilaterally  Pulses:  2+ and symmetric all extremities   Skin:  Skin color, texture, turgor normal, no rashes or lesions   Lymph nodes:  Cervical, supraclavicular, and axillary nodes normal   Neurologic:  CNII-XII intact, normal strength, sensation and gait; reflexes 2+ and symmetric throughout    Psych: Normal mood, affect, hygiene and grooming    Update eye exam   ASSESSMENT/PLAN:   Discussed monthly self breast exams and yearly mammograms; at least 30 minutes of aerobic activity at least 5 days/week, weight-bearing exercise at least 2x/wk; proper sunscreen use reviewed; healthy diet, including goals of calcium and vitamin D intake and alcohol recommendations (less than or equal to 1 drink/day) reviewed; regular seatbelt use; changing batteries in smoke detectors. Immunization recommendations discussed--high dose flu shots recommended yearly (not yet available); Shingles vaccine recommended, risks reviewed. Colonoscopy recommendations reviewed, UTD.  MOST form reviewed and updated/signed. Full code, full care, no prolonged IV fluids/tube feeds  Medicare Attestation I have personally reviewed: The patient's medical and social history Their use of alcohol, tobacco or illicit drugs Their current medications and supplements The patient's functional ability including ADLs,fall risks, home safety risks, cognitive, and hearing and visual impairment Diet and physical activities Evidence for depression or mood  disorders  The patient's weight, height, and BMI have been recorded in the chart.  I have made referrals, counseling, and provided education to the patient based on review of the above and I have provided the patient with a written personalized care plan for preventive services.     Lyrical Sowle A, MD   12/06/2015

## 2015-12-08 ENCOUNTER — Ambulatory Visit: Payer: BLUE CROSS/BLUE SHIELD | Admitting: Family Medicine

## 2015-12-16 ENCOUNTER — Other Ambulatory Visit: Payer: Self-pay | Admitting: Family Medicine

## 2015-12-16 DIAGNOSIS — M8580 Other specified disorders of bone density and structure, unspecified site: Secondary | ICD-10-CM

## 2015-12-30 DIAGNOSIS — H401121 Primary open-angle glaucoma, left eye, mild stage: Secondary | ICD-10-CM | POA: Diagnosis not present

## 2015-12-30 DIAGNOSIS — H268 Other specified cataract: Secondary | ICD-10-CM | POA: Diagnosis not present

## 2015-12-30 DIAGNOSIS — H2512 Age-related nuclear cataract, left eye: Secondary | ICD-10-CM | POA: Diagnosis not present

## 2016-01-20 ENCOUNTER — Other Ambulatory Visit: Payer: Self-pay | Admitting: Family Medicine

## 2016-01-20 DIAGNOSIS — M8580 Other specified disorders of bone density and structure, unspecified site: Secondary | ICD-10-CM

## 2016-01-26 DIAGNOSIS — D1801 Hemangioma of skin and subcutaneous tissue: Secondary | ICD-10-CM | POA: Diagnosis not present

## 2016-01-26 DIAGNOSIS — D2271 Melanocytic nevi of right lower limb, including hip: Secondary | ICD-10-CM | POA: Diagnosis not present

## 2016-01-26 DIAGNOSIS — D225 Melanocytic nevi of trunk: Secondary | ICD-10-CM | POA: Diagnosis not present

## 2016-01-26 DIAGNOSIS — D2272 Melanocytic nevi of left lower limb, including hip: Secondary | ICD-10-CM | POA: Diagnosis not present

## 2016-01-26 DIAGNOSIS — L814 Other melanin hyperpigmentation: Secondary | ICD-10-CM | POA: Diagnosis not present

## 2016-01-26 DIAGNOSIS — L821 Other seborrheic keratosis: Secondary | ICD-10-CM | POA: Diagnosis not present

## 2016-01-26 DIAGNOSIS — B353 Tinea pedis: Secondary | ICD-10-CM | POA: Diagnosis not present

## 2016-01-26 DIAGNOSIS — L918 Other hypertrophic disorders of the skin: Secondary | ICD-10-CM | POA: Diagnosis not present

## 2016-01-26 DIAGNOSIS — L570 Actinic keratosis: Secondary | ICD-10-CM | POA: Diagnosis not present

## 2016-01-26 DIAGNOSIS — D2239 Melanocytic nevi of other parts of face: Secondary | ICD-10-CM | POA: Diagnosis not present

## 2016-02-24 ENCOUNTER — Other Ambulatory Visit: Payer: Self-pay

## 2016-03-16 ENCOUNTER — Encounter: Payer: Self-pay | Admitting: Family Medicine

## 2016-03-16 ENCOUNTER — Ambulatory Visit (INDEPENDENT_AMBULATORY_CARE_PROVIDER_SITE_OTHER): Payer: Medicare Other | Admitting: Family Medicine

## 2016-03-16 VITALS — BP 110/68 | HR 94 | Temp 98.8°F | Resp 16 | Wt 169.6 lb

## 2016-03-16 DIAGNOSIS — J029 Acute pharyngitis, unspecified: Secondary | ICD-10-CM | POA: Diagnosis not present

## 2016-03-16 DIAGNOSIS — R059 Cough, unspecified: Secondary | ICD-10-CM

## 2016-03-16 DIAGNOSIS — R05 Cough: Secondary | ICD-10-CM

## 2016-03-16 MED ORDER — AMOXICILLIN 875 MG PO TABS: 875.0000 mg | ORAL_TABLET | Freq: Two times a day (BID) | ORAL | 0 refills | Status: DC

## 2016-03-16 MED ORDER — BENZONATATE 200 MG PO CAPS: 200.0000 mg | ORAL_CAPSULE | Freq: Three times a day (TID) | ORAL | 0 refills | Status: DC | PRN

## 2016-03-16 NOTE — Progress Notes (Signed)
Subjective:    Patient ID: Joan Morales, female    DOB: [birth year], 75 y.o.   MRN: [id number]  HPI Chief Complaint  Patient presents with  . cough    deep cough, chills, diarrhea, weak and tired. started last friday   She is here with complaints of a 7 day history of sore throat mainly with coughing, is not painful to swallow and, a dry cough that is occasionally productive of clear to white sputum. Also reports feeling tired and having a decreased appetite. Cough seems to be getting worse.  Husband was also sick last week.   Has been taking Robitussin DM and Ibuprofen.   Denies history of bronchitis, asthma, COPD.  Never smoked.  No recent antibiotics.   Denies fever, body aches, ear pain, rhinorrhea, nasal congestion, chest pain, palpitations, wheezing, orthopnea, abdominal pain, nausea, vomiting, GU symptoms, LE edema.   Diarrhea last Saturday, reports 2 episodes of loose brown stool. No blood or pus. No diarrhea since then.     Past Medical History:  Diagnosis Date  . Allergic rhinitis, cause unspecified   . Arthritis    "hands" (03/10/2014)  . GERD (gastroesophageal reflux disease)   . Osteopenia    fosamax started 01/2012  . Ovarian teratoma    left, removed 12/06  . PONV (postoperative nausea and vomiting)   . Pure hypercholesterolemia    Past Surgical History:  Procedure Laterality Date  . EYE SURGERY    . FRACTURE SURGERY    . GAS INSERTION Left 03/10/2014   Procedure: INSERTION OF [id number];  Surgeon: Hayden Pedro, MD;  Location: [facility];  Service: Ophthalmology;  Laterality: Left;  . LASER PHOTO ABLATION Left 03/10/2014   Procedure: LASER PHOTO ABLATION-HEADSCOPE LASER;  Surgeon: Hayden Pedro, MD;  Location: [facility];  Service: Ophthalmology;  Laterality: Left;  . SCLERAL BUCKLE Left 03/10/2014  . SCLERAL BUCKLE WITH CRYO Left 03/10/2014   Procedure: SCLERAL BUCKLE WITH CRYO AND DIATHERMY;  Surgeon: Hayden Pedro, MD;  Location: [facility];  Service:  Ophthalmology;  Laterality: Left;  . TONSILLECTOMY    . TOTAL ABDOMINAL HYSTERECTOMY  02/2005   w/BSO; L ovarian teratoma  . TOTAL ABDOMINAL HYSTERECTOMY W/ BILATERAL SALPINGOOPHORECTOMY  12/06   L ovarian teratoma  . WRIST FRACTURE SURGERY Right 02/26/14   plate and screws      Review of Systems Pertinent positives and negatives in the history of present illness.     Objective:   Physical Exam  Constitutional: She is oriented to person, place, and time. She appears well-developed and well-nourished. No distress.  HENT:  Right Ear: Tympanic membrane and ear canal normal.  Left Ear: Tympanic membrane and ear canal normal.  Nose: Nose normal. Right sinus exhibits no maxillary sinus tenderness and no frontal sinus tenderness. Left sinus exhibits no maxillary sinus tenderness and no frontal sinus tenderness.  Mouth/Throat: Uvula is midline, oropharynx is clear and moist and mucous membranes are normal.  Eyes: Conjunctivae are normal.  Cardiovascular: Normal rate, regular rhythm, normal heart sounds and normal pulses.   Pulmonary/Chest: Effort normal and breath sounds normal. No accessory muscle usage.  Lymphadenopathy:    She has no cervical adenopathy.       Right: No supraclavicular adenopathy present.       Left: No supraclavicular adenopathy present.  Neurological: She is alert and oriented to person, place, and time.  Skin: Skin is warm and dry. No pallor.  Psychiatric: She has a normal mood and affect. Her  speech is normal. Thought content normal. She is not agitated.   BP 110/68   Pulse 94   Temp 98.8 F (37.1 C) (Oral)   Resp 16   Wt 169 lb 9.6 oz (76.9 kg)   SpO2 98%   BMI 24.69 kg/m      Assessment & Plan:  Cough  Acute pharyngitis, unspecified etiology  Discussed treatment of cough and throat discomfort. Amoxil prescribed and Tessalon. Advised to increase water intake. OTC treatments discussed. She will call or return if not back to baseline after completing  the antibiotic.  She would like to get a flu shot after she gets better.

## 2016-03-16 NOTE — Patient Instructions (Signed)
Stay well hydrated. You can try chloraseptic spray or lozenges for throat discomfort and salt water gargles.  Try the Tessalon if you would like, I sent this to your pharmacy.  If you are not back to baseline after finishing the antibiotic, let us know.  Return for a flu shot after you are feeling better.   Cough, Adult Coughing is a reflex that clears your throat and your airways. Coughing helps to heal and protect your lungs. It is normal to cough occasionally, but a cough that happens with other symptoms or lasts a long time may be a sign of a condition that needs treatment. A cough may last only 2-3 weeks (acute), or it may last longer than 8 weeks (chronic). What are the causes? Coughing is commonly caused by:  Breathing in substances that irritate your lungs.  A viral or bacterial respiratory infection.  Allergies.  Asthma.  Postnasal drip.  Smoking.  Acid backing up from the stomach into the esophagus (gastroesophageal reflux).  Certain medicines.  Chronic lung problems, including COPD (or rarely, lung cancer).  Other medical conditions such as heart failure. Follow these instructions at home: Pay attention to any changes in your symptoms. Take these actions to help with your discomfort:  Take medicines only as told by your health care provider.  If you were prescribed an antibiotic medicine, take it as told by your health care provider. Do not stop taking the antibiotic even if you start to feel better.  Talk with your health care provider before you take a cough suppressant medicine.  Drink enough fluid to keep your urine clear or pale yellow.  If the air is dry, use a cold steam vaporizer or humidifier in your bedroom or your home to help loosen secretions.  Avoid anything that causes you to cough at work or at home.  If your cough is worse at night, try sleeping in a semi-upright position.  Avoid cigarette smoke. If you smoke, quit smoking. If you need help  quitting, ask your health care provider.  Avoid caffeine.  Avoid alcohol.  Rest as needed. Contact a health care provider if:  You have new symptoms.  You cough up pus.  Your cough does not get better after 2-3 weeks, or your cough gets worse.  You cannot control your cough with suppressant medicines and you are losing sleep.  You develop pain that is getting worse or pain that is not controlled with pain medicines.  You have a fever.  You have unexplained weight loss.  You have night sweats. Get help right away if:  You cough up blood.  You have difficulty breathing.  Your heartbeat is very fast. This information is not intended to replace advice given to you by your health care provider. Make sure you discuss any questions you have with your health care provider. Document Released: 09/02/2010 Document Revised: 08/12/2015 Document Reviewed: 05/13/2014 Elsevier Interactive Patient Education  2017 [employer].

## 2016-03-29 ENCOUNTER — Telehealth: Payer: Self-pay | Admitting: Medical

## 2016-03-29 NOTE — Telephone Encounter (Signed)
Pt called and stated that she has finished her medication but is still sick. She is requesting another round of medication. Pt uses [facility] on [address] and can be reached at [phone].

## 2016-03-29 NOTE — Telephone Encounter (Signed)
I'll forward to you since you saw her recently

## 2016-03-30 MED ORDER — DOXYCYCLINE HYCLATE 100 MG PO TABS: 100.0000 mg | ORAL_TABLET | Freq: Two times a day (BID) | ORAL | 0 refills | Status: DC

## 2016-03-30 NOTE — Telephone Encounter (Signed)
Please call and find out if she got better and how much better? What are her current symptoms? We can send in another round of antibiotics but if she is not back to baseline after finishing them she will need to be seen again.

## 2016-03-30 NOTE — Telephone Encounter (Signed)
pls see msg.

## 2016-03-30 NOTE — Telephone Encounter (Signed)
Pt is only about 50% better. She is still coughing up mucous as the tessalon didn't help and she doesn't think the amoxicillin helped either. She has been using mucinex but no relief. Pt would like something else beside amoxcillin sent in

## 2016-03-30 NOTE — Telephone Encounter (Signed)
done

## 2016-03-30 NOTE — Telephone Encounter (Signed)
Doxycycline 100 mg bid, #10 tabs, no refills please

## 2016-03-30 NOTE — Telephone Encounter (Signed)
Pt called for an update on this message & this should have gone to [facility]

## 2016-03-31 ENCOUNTER — Telehealth: Payer: Self-pay | Admitting: Family Medicine

## 2016-03-31 ENCOUNTER — Other Ambulatory Visit: Payer: Self-pay | Admitting: Medical

## 2016-03-31 MED ORDER — PREDNISONE 20 MG PO TABS: ORAL_TABLET | ORAL | 0 refills | Status: DC

## 2016-03-31 NOTE — Telephone Encounter (Signed)
Spoke with patient and she states that doxcycline made her sick and she does not want to take that. She is going to talk to her husband to see if she should try over the counter med first or try augmentin. She will call back and let us know

## 2016-03-31 NOTE — Telephone Encounter (Signed)
Pt called and states that she took the doxycycline and it made her very to her stomach, and she is not going to take it this morning, she could only take sips of her coffee, she is wondering if there is something else she could take, informed pt that you was out of the office today, pt uses [facility] ([place]), [place] [address] pt can be reached at [phone]

## 2016-03-31 NOTE — Telephone Encounter (Signed)
I spoke to patient.  Still has a lot of cough, some production.  Completely amoxicillin 10 day course.  Started doxycycline yesterday, made her very nauseated. Using mucinex with good production.   She agrees to 3 day course of prednisone to help.  No fever, over 50 % better than when she was here.  Declines visit today, no wheezing, no SOB, no leg swelling or pain.  advised to call back Monday with update.

## 2016-04-03 ENCOUNTER — Telehealth: Payer: Self-pay | Admitting: Family Medicine

## 2016-04-03 ENCOUNTER — Encounter (INDEPENDENT_AMBULATORY_CARE_PROVIDER_SITE_OTHER): Payer: BLUE CROSS/BLUE SHIELD | Admitting: Ophthalmology

## 2016-04-03 NOTE — Telephone Encounter (Signed)
Cough is much better. Patient is not having any vaginal irritation. Thinks she may have misspoken this morning, bleeding is not vaginal-thinks it may be urinary and will come in for OV for UA if happens again.

## 2016-04-03 NOTE — Telephone Encounter (Signed)
Pt called and stated that she saw Vickie and was given Amoxil and then call on Friday and Shane gave her prednisone. She states that she is now bleeding from her vagina. She believes it is from that medication. Pt was offered an appt and she declined. Please advise pt at [phone] and uses [facility] [place]. Sending to Dr. Tomi Bamberger as Loletha Carrow and [facility] both out and Dr. Tomi Bamberger is her PCP.

## 2016-04-03 NOTE — Telephone Encounter (Signed)
Antibiotics and prednisone shouldn't cause vaginal bleeding, unless she is having other symptoms--vaginal irritation/inflammation, yeast infection which makes the skin raw and bleeding.  I hope that her cough is finally improving.  If having vaginal itching we can send in diflucan.  If truly bleeding/spotting (without irritation), then needs eval in office

## 2016-04-03 NOTE — Telephone Encounter (Signed)
Error should be Tomi Bamberger

## 2016-04-19 ENCOUNTER — Encounter (INDEPENDENT_AMBULATORY_CARE_PROVIDER_SITE_OTHER): Payer: Medicare Other | Admitting: Ophthalmology

## 2016-04-19 DIAGNOSIS — H338 Other retinal detachments: Secondary | ICD-10-CM | POA: Diagnosis not present

## 2016-04-19 DIAGNOSIS — H43813 Vitreous degeneration, bilateral: Secondary | ICD-10-CM | POA: Diagnosis not present

## 2016-04-19 DIAGNOSIS — H59032 Cystoid macular edema following cataract surgery, left eye: Secondary | ICD-10-CM | POA: Diagnosis not present

## 2016-04-19 DIAGNOSIS — H35372 Puckering of macula, left eye: Secondary | ICD-10-CM

## 2016-05-11 ENCOUNTER — Other Ambulatory Visit: Payer: Self-pay | Admitting: Family Medicine

## 2016-05-11 DIAGNOSIS — M8580 Other specified disorders of bone density and structure, unspecified site: Secondary | ICD-10-CM

## 2016-05-15 DIAGNOSIS — Z961 Presence of intraocular lens: Secondary | ICD-10-CM | POA: Diagnosis not present

## 2016-05-15 DIAGNOSIS — H43811 Vitreous degeneration, right eye: Secondary | ICD-10-CM | POA: Diagnosis not present

## 2016-05-15 DIAGNOSIS — H35372 Puckering of macula, left eye: Secondary | ICD-10-CM | POA: Diagnosis not present

## 2016-05-15 DIAGNOSIS — H2511 Age-related nuclear cataract, right eye: Secondary | ICD-10-CM | POA: Diagnosis not present

## 2016-05-15 DIAGNOSIS — H35352 Cystoid macular degeneration, left eye: Secondary | ICD-10-CM | POA: Diagnosis not present

## 2016-05-15 DIAGNOSIS — H40052 Ocular hypertension, left eye: Secondary | ICD-10-CM | POA: Diagnosis not present

## 2016-09-02 DIAGNOSIS — J069 Acute upper respiratory infection, unspecified: Secondary | ICD-10-CM | POA: Diagnosis not present

## 2016-09-02 DIAGNOSIS — R05 Cough: Secondary | ICD-10-CM | POA: Diagnosis not present

## 2016-09-05 DIAGNOSIS — J209 Acute bronchitis, unspecified: Secondary | ICD-10-CM | POA: Diagnosis not present

## 2016-09-19 DIAGNOSIS — H35372 Puckering of macula, left eye: Secondary | ICD-10-CM | POA: Diagnosis not present

## 2016-09-19 DIAGNOSIS — H35352 Cystoid macular degeneration, left eye: Secondary | ICD-10-CM | POA: Diagnosis not present

## 2016-09-19 DIAGNOSIS — H2511 Age-related nuclear cataract, right eye: Secondary | ICD-10-CM | POA: Diagnosis not present

## 2016-09-19 DIAGNOSIS — Z961 Presence of intraocular lens: Secondary | ICD-10-CM | POA: Diagnosis not present

## 2016-09-19 DIAGNOSIS — H40052 Ocular hypertension, left eye: Secondary | ICD-10-CM | POA: Diagnosis not present

## 2016-09-19 DIAGNOSIS — H401111 Primary open-angle glaucoma, right eye, mild stage: Secondary | ICD-10-CM | POA: Diagnosis not present

## 2016-09-19 DIAGNOSIS — H43811 Vitreous degeneration, right eye: Secondary | ICD-10-CM | POA: Diagnosis not present

## 2016-10-10 NOTE — Progress Notes (Signed)
Chief Complaint  Patient presents with  . fasting med check plus    fasitng med check plus. no other concerns    Joan Morales is a 76 y.o. female who presents for annual wellness visit and follow-up on chronic medical conditions.  She has the following concerns:  "I've just gotten over bronchitis".  She was treated at 2 different urgent cares ([place]  She was treated with Augmentin, Tessalon perles (not helpful) and an inhaler (helped). She still some ongoing cough, improving, she thinks is now having sinus drainage contributing to cough.  Mucus is now clear.  No fevers or shortness of breath.  She last used the inhaler a few days ago.  Osteopenia: She has been on fosamax since 02/2012. Initial T-score was T-2.4, and high frisk FRAX score (hip and elsewhere). She denies any significant side effects--just notes occasional heartburn which she has sorted out that it is diet related rather than from medications. Denies dysphagia. Last DEXA was 08/2014, T-2.2 at [facility].  Immunization History  Administered Date(s) Administered  . Influenza Split 01/04/2012  . Influenza, High Dose Seasonal PF 12/15/2013, 02/26/2015  . Pneumococcal Conjugate-13 06/04/2013  . Pneumococcal Polysaccharide-23 09/17/2008  . Td 10/20/2015  . Tdap 05/07/2005  (the Td was given at [facility])  Last Pap smear: s/p hysterectomy  Last mammogram: 09/2015 Last colonoscopy: 2010  Last DEXA: 08/2014  Dentist: twice yearly for cleanings Ophtho:  Every 6 months (frequently when had detached retina) Exercise: walking on the walking track at church 2 days/week, 1 mile (15-20 minutes). Limited exercise since bronchitis, and watching grandkids.  No weight-bearing exercise.  Other doctors caring for patient include: Dermatologist (Dr. Rolm Bookbinder) Dentist: Dr. Moss Mc Ortho: Dr. Percell Miller (for her wrist). Retina specialist: Dr. Zigmund Daniel Ophtho: Dr. Katy Fitch GI: Dr.  Penelope Coop  Depression screen:  Negative  Fall screen: Negative Functional status screen:  Notable only for trouble with stairs due to right knee pain from arthritis, leakage of urine with cough/sneeze. See full screens in epic.  End of Life Discussion:  Patient has a living will and medical power of attorney   ROS: The patient denies anorexia, fever, weight changes, headaches, decreased hearing, ear pain, sore throat, breast concerns, chest pain, palpitations, dizziness, syncope, dyspnea on exertion, swelling, nausea, vomiting, diarrhea, constipation, abdominal pain, melena, hematochezia, incontinence, dysuria, vaginal bleeding, discharge, odor or itch, genital lesions, numbness, tingling, weakness, tremor, suspicious skin lesions, depression, anxiety, abnormal bleeding/bruising, or enlarged lymph nodes.  Mild seasonal allergies, relieved by chlortrimeton as needed, usually in the Spring. She read this medication wasn't good to take at her age, asking for recommendations. Occasional heartburn, uses pepcid as needed (rare) Arthritis in hands/fingers Some right knee pain, noticed only when going up stairs a lot. Some arthritis right wrist, s/p fracture. +residual cough per HPI, PND.    PHYSICAL EXAM:  BP 120/70   Ht 5\' 9"  (1.753 m)   Wt 165 lb 9.6 oz (75.1 kg)   BMI 24.45 kg/m   Wt Readings from Last 3 Encounters:  10/11/16 165 lb 9.6 oz (75.1 kg)  03/16/16 169 lb 9.6 oz (76.9 kg)  11/04/14 167 lb 3.2 oz (75.8 kg)     General Appearance:  Alert, cooperative, no distress, appears stated age   Head:  Normocephalic, without obvious abnormality, atraumatic   Eyes:  PERRL, conjunctiva/corneas clear, EOM's intact, fundi benign    Ears:  Normal TM's and external ear canals  Nose:  Nares normal, mucosa with moderate edema, L>R with clear mucus; no sinus tenderness   Throat:  Lips, mucosa, and tongue normal; teeth and gums normal   Neck:  Supple, no lymphadenopathy;  thyroid: no enlargement/tenderness/nodules; no carotid bruit or JVD   Back:  Spine nontender, no curvature, ROM normal, no CVA tenderness   Lungs:  Clear to auscultation bilaterally without wheezes, rales or ronchi; respirations unlabored   Chest Wall:  No tenderness or deformity. Prominent manubrium, nontender  Heart:  Regular rate and rhythm, S1 and S2 normal, no murmur, rub  or gallop   Breast Exam:  No tenderness, masses, or nipple discharge or inversion. No axillary lymphadenopathy   Abdomen:  Soft, non-tender, nondistended, normoactive bowel sounds,  no masses, no hepatosplenomegaly   Genitalia:  Normal external genitalia without lesions, mild atrophic changes. BUS and vagina normal; bimanual exam normal--no pelvic masses appreciable, no tenderness. Pap not performed   Rectal:  Normal tone, no masses or tenderness; guaiac negative stool   Extremities:  No clubbing, cyanosis or edema. Bilateral bunions, hammertoes and callouses. Onychomycotic, thickened nails. Arthritic deformity to DIP's in handsbilaterally.  Trace edema at ankles, L>R  Pulses:  2+ and symmetric all extremities   Skin:  Skin color, texture, turgor normal, no rashes. Hemangiomas, some SK's, one fleshy-pink soft large papule right upper back(unchanged per pt)  Lymph nodes:  Cervical, supraclavicular, and axillary nodes normal   Neurologic:  CNII-XII intact, normal strength, sensation and gait; reflexes 2+ and symmetric throughout    Psych:  Normal mood, affect, hygiene and grooming    ASSESSMENT/PLAN:  Medicare annual wellness visit, subsequent  Hyperlipidemia, unspecified hyperlipidemia type - Plan: Lipid panel  Osteopenia, unspecified location - due for DEXA. Cont alendronate--complete 5 yrs then likely stop and recheck in 28yrs. Wt bearing exercise, Ca and D discussed. - Plan: VITAMIN D 25 Hydroxy (Vit-D Deficiency, Fractures), alendronate (FOSAMAX) 70 MG  tablet  Screening for diabetes mellitus - Plan: Glucose, random  Cough - bronchitis resolved.  likely due to PND from allergies now  Allergic rhinitis, unspecified seasonality, unspecified trigger - trial of claritin or allegra prn, +/- flonase    DEXA due ([facility]) shingrix when available (counseled, discussed in detail).  Glu, lipid, Vit D (understands this may not be covered by medicare and is willing to pay the estimated $42 on waiver form--verbally consented)  Bunions and Hammertoes and onychomycosis--asymptomatic. Podiatrist recommended if symptoms develop or trouble cutting nails.   Discussed monthly self breast exams and yearly mammograms; at least 30 minutes of aerobic activity at least 5 days/week, weight-bearing exercise at least 2x/wk; proper sunscreen use reviewed; healthy diet, including goals of calcium and vitamin D intake and alcohol recommendations (less than or equal to 1 drink/day) reviewed; regular seatbelt use; changing batteries in smoke detectors. Immunization recommendations discussed--high dose flu shots recommended yearly (not yet available); Shingrix recommended (currently on manufacturer backorder), risks reviewed. Colonoscopy recommendations reviewed, UTD.  MOST form reviewed and updated/signed. Full code, full care, no prolonged IV fluids/tube feeds  Discussed potentially stopping alendronate after this year (started 02/2012, will be 5 years soon). Wait to see DEXA.  I recommend use of claritin (loratidine) or allegra (fexofenadine) as a 24 hour non-sedating antihistamine, to use if/when needed for allergy symptoms.  This is likely safer than chlortrimeton. If you are having a bad allergy season, using a daily inhaled nasal steroid such as Flonase also is very effective.   Medicare Attestation I have personally reviewed: The patient's medical and  social history Their use of alcohol, tobacco or illicit drugs Their current medications and  supplements The patient's functional ability including ADLs,fall risks, home safety risks, cognitive, and hearing and visual impairment Diet and physical activities Evidence for depression or mood disorders  The patient's weight, height, and BMI have been recorded in the chart.  I have made referrals, counseling, and provided education to the patient based on review of the above and I have provided the patient with a written personalized care plan for preventive services.

## 2016-10-11 ENCOUNTER — Ambulatory Visit (INDEPENDENT_AMBULATORY_CARE_PROVIDER_SITE_OTHER): Payer: Medicare Other | Admitting: Family Medicine

## 2016-10-11 ENCOUNTER — Encounter: Payer: Self-pay | Admitting: Family Medicine

## 2016-10-11 VITALS — BP 120/70 | Ht 69.0 in | Wt 165.6 lb

## 2016-10-11 DIAGNOSIS — E785 Hyperlipidemia, unspecified: Secondary | ICD-10-CM

## 2016-10-11 DIAGNOSIS — R05 Cough: Secondary | ICD-10-CM | POA: Diagnosis not present

## 2016-10-11 DIAGNOSIS — Z0000 Encounter for general adult medical examination without abnormal findings: Secondary | ICD-10-CM

## 2016-10-11 DIAGNOSIS — J309 Allergic rhinitis, unspecified: Secondary | ICD-10-CM | POA: Diagnosis not present

## 2016-10-11 DIAGNOSIS — R059 Cough, unspecified: Secondary | ICD-10-CM

## 2016-10-11 DIAGNOSIS — Z131 Encounter for screening for diabetes mellitus: Secondary | ICD-10-CM | POA: Diagnosis not present

## 2016-10-11 DIAGNOSIS — M8580 Other specified disorders of bone density and structure, unspecified site: Secondary | ICD-10-CM

## 2016-10-11 LAB — LIPID PANEL
Cholesterol: 240 mg/dL — ABNORMAL HIGH (ref <200)
HDL: 81 mg/dL (ref >50)
LDL Cholesterol: 145 mg/dL — ABNORMAL HIGH (ref <100)
Total CHOL/HDL Ratio: 3 Ratio (ref <5.0)
Triglycerides: 70 mg/dL (ref <150)
VLDL: 14 mg/dL (ref <30)

## 2016-10-11 LAB — GLUCOSE, RANDOM: Glucose, Bld: 82 mg/dL (ref 65–99)

## 2016-10-11 MED ORDER — ALENDRONATE SODIUM 70 MG PO TABS: ORAL_TABLET | ORAL | 3 refills | Status: DC

## 2016-10-11 NOTE — Patient Instructions (Addendum)
HEALTH MAINTENANCE RECOMMENDATIONS:  It is recommended that you get at least 30 minutes of aerobic exercise at least 5 days/week (for weight loss, you may need as much as 60-90 minutes). This can be any activity that gets your heart rate up. This can be divided in 10-15 minute intervals if needed, but try and build up your endurance at least once a week.  Weight bearing exercise is also recommended twice weekly.  Eat a healthy diet with lots of vegetables, fruits and fiber.  "Colorful" foods have a lot of vitamins (ie green vegetables, tomatoes, red peppers, etc).  Limit sweet tea, regular sodas and alcoholic beverages, all of which has a lot of calories and sugar.  Up to 1 alcoholic drink daily may be beneficial for women (unless trying to lose weight, watch sugars).  Drink a lot of water.  Calcium recommendations are 1200-1500 mg daily (1500 mg for postmenopausal women or women without ovaries), and vitamin D 1000 IU daily.  This should be obtained from diet and/or supplements (vitamins), and calcium should not be taken all at once, but in divided doses.  Monthly self breast exams and yearly mammograms for women over the age of 70 is recommended.  Sunscreen of at least SPF 30 should be used on all sun-exposed parts of the skin when outside between the hours of 10 am and 4 pm (not just when at beach or pool, but even with exercise, golf, tennis, and yard work!)  Use a sunscreen that says "broad spectrum" so it covers both UVA and UVB rays, and make sure to reapply every 1-2 hours.  Remember to change the batteries in your smoke detectors when changing your clock times in the spring and fall.  Use your seat belt every time you are in a car, and please drive safely and not be distracted with cell phones and texting while driving.   Ms. Sutphin , Thank you for taking time to come for your Medicare Wellness Visit. I appreciate your ongoing commitment to your health goals. Please review the following  plan we discussed and let me know if I can assist you in the future.   These are the goals we discussed: Goals    None      This is a list of the screening recommended for you and due dates:  Health Maintenance  Topic Date Due  . Mammogram  09/22/2016  . Flu Shot  10/18/2016  . Colon Cancer Screening  09/03/2018  . Tetanus Vaccine  10/19/2025  . DEXA scan (bone density measurement)  Completed  . Pneumonia vaccines  Completed   We briefly discussed Cologard as an alternative to colonoscopy for colon cancer screening, when next due in 2020.  Colonoscopy is best. You are due for you bone density test.  Please schedule this along with your mammogram at [facility].  I recommend getting the new shingles vaccine (Shingrix). You will need to check with your insurance to see if it is covered, and if covered by Medicare Part D, you need to get from the pharmacy rather than our office.  It is a series of 2 injections, spaced 2 months apart. It is currently on manufacturer backorder--wait to get the first one until you see it being advertised again (2019).  I recommend use of claritin (loratidine) or allegra (fexofenadine) as a 24 hour non-sedating antihistamine, to use if/when needed for allergy symptoms.  This is likely safer than chlortrimeton. If you are having a bad allergy season, using a daily  inhaled nasal steroid such as Flonase also is very effective.

## 2016-10-12 ENCOUNTER — Encounter: Payer: Self-pay | Admitting: Family Medicine

## 2016-10-12 LAB — VITAMIN D 25 HYDROXY (VIT D DEFICIENCY, FRACTURES): VIT D 25 HYDROXY: 36 ng/mL (ref 30–100)

## 2016-10-25 ENCOUNTER — Ambulatory Visit (INDEPENDENT_AMBULATORY_CARE_PROVIDER_SITE_OTHER): Payer: Medicare Other | Admitting: Ophthalmology

## 2016-10-25 DIAGNOSIS — H43813 Vitreous degeneration, bilateral: Secondary | ICD-10-CM | POA: Diagnosis not present

## 2016-10-25 DIAGNOSIS — H35371 Puckering of macula, right eye: Secondary | ICD-10-CM | POA: Diagnosis not present

## 2016-10-25 DIAGNOSIS — H338 Other retinal detachments: Secondary | ICD-10-CM

## 2016-10-25 DIAGNOSIS — H59031 Cystoid macular edema following cataract surgery, right eye: Secondary | ICD-10-CM

## 2016-10-25 DIAGNOSIS — H33301 Unspecified retinal break, right eye: Secondary | ICD-10-CM | POA: Diagnosis not present

## 2016-11-08 ENCOUNTER — Encounter (INDEPENDENT_AMBULATORY_CARE_PROVIDER_SITE_OTHER): Payer: Medicare Other | Admitting: Ophthalmology

## 2016-11-08 DIAGNOSIS — H26492 Other secondary cataract, left eye: Secondary | ICD-10-CM

## 2016-11-14 DIAGNOSIS — M8589 Other specified disorders of bone density and structure, multiple sites: Secondary | ICD-10-CM | POA: Diagnosis not present

## 2016-11-14 DIAGNOSIS — Z803 Family history of malignant neoplasm of breast: Secondary | ICD-10-CM | POA: Diagnosis not present

## 2016-11-14 DIAGNOSIS — Z1231 Encounter for screening mammogram for malignant neoplasm of breast: Secondary | ICD-10-CM | POA: Diagnosis not present

## 2016-11-14 LAB — HM DEXA SCAN

## 2016-11-23 ENCOUNTER — Encounter (INDEPENDENT_AMBULATORY_CARE_PROVIDER_SITE_OTHER): Payer: Medicare Other | Admitting: Ophthalmology

## 2016-11-23 DIAGNOSIS — H2702 Aphakia, left eye: Secondary | ICD-10-CM

## 2016-11-28 ENCOUNTER — Encounter: Payer: Self-pay | Admitting: *Deleted

## 2017-01-24 DIAGNOSIS — H35352 Cystoid macular degeneration, left eye: Secondary | ICD-10-CM | POA: Diagnosis not present

## 2017-01-24 DIAGNOSIS — H2511 Age-related nuclear cataract, right eye: Secondary | ICD-10-CM | POA: Diagnosis not present

## 2017-01-24 DIAGNOSIS — H401111 Primary open-angle glaucoma, right eye, mild stage: Secondary | ICD-10-CM | POA: Diagnosis not present

## 2017-01-24 DIAGNOSIS — Z961 Presence of intraocular lens: Secondary | ICD-10-CM | POA: Diagnosis not present

## 2017-01-24 DIAGNOSIS — H43811 Vitreous degeneration, right eye: Secondary | ICD-10-CM | POA: Diagnosis not present

## 2017-01-24 DIAGNOSIS — H35372 Puckering of macula, left eye: Secondary | ICD-10-CM | POA: Diagnosis not present

## 2017-03-28 ENCOUNTER — Encounter (INDEPENDENT_AMBULATORY_CARE_PROVIDER_SITE_OTHER): Payer: Medicare Other | Admitting: Ophthalmology

## 2017-03-28 DIAGNOSIS — H59032 Cystoid macular edema following cataract surgery, left eye: Secondary | ICD-10-CM | POA: Diagnosis not present

## 2017-03-28 DIAGNOSIS — H33301 Unspecified retinal break, right eye: Secondary | ICD-10-CM

## 2017-03-28 DIAGNOSIS — H43813 Vitreous degeneration, bilateral: Secondary | ICD-10-CM

## 2017-03-28 DIAGNOSIS — H2511 Age-related nuclear cataract, right eye: Secondary | ICD-10-CM | POA: Diagnosis not present

## 2017-03-28 DIAGNOSIS — H35372 Puckering of macula, left eye: Secondary | ICD-10-CM | POA: Diagnosis not present

## 2017-03-28 DIAGNOSIS — H338 Other retinal detachments: Secondary | ICD-10-CM

## 2017-04-04 DIAGNOSIS — D2239 Melanocytic nevi of other parts of face: Secondary | ICD-10-CM | POA: Diagnosis not present

## 2017-04-04 DIAGNOSIS — B353 Tinea pedis: Secondary | ICD-10-CM | POA: Diagnosis not present

## 2017-04-04 DIAGNOSIS — D2271 Melanocytic nevi of right lower limb, including hip: Secondary | ICD-10-CM | POA: Diagnosis not present

## 2017-04-04 DIAGNOSIS — B351 Tinea unguium: Secondary | ICD-10-CM | POA: Diagnosis not present

## 2017-04-04 DIAGNOSIS — D2272 Melanocytic nevi of left lower limb, including hip: Secondary | ICD-10-CM | POA: Diagnosis not present

## 2017-04-04 DIAGNOSIS — D1801 Hemangioma of skin and subcutaneous tissue: Secondary | ICD-10-CM | POA: Diagnosis not present

## 2017-04-04 DIAGNOSIS — L820 Inflamed seborrheic keratosis: Secondary | ICD-10-CM | POA: Diagnosis not present

## 2017-04-04 DIAGNOSIS — L821 Other seborrheic keratosis: Secondary | ICD-10-CM | POA: Diagnosis not present

## 2017-04-04 DIAGNOSIS — L814 Other melanin hyperpigmentation: Secondary | ICD-10-CM | POA: Diagnosis not present

## 2017-04-04 DIAGNOSIS — D225 Melanocytic nevi of trunk: Secondary | ICD-10-CM | POA: Diagnosis not present

## 2017-04-04 DIAGNOSIS — D485 Neoplasm of uncertain behavior of skin: Secondary | ICD-10-CM | POA: Diagnosis not present

## 2017-05-11 DIAGNOSIS — B308 Other viral conjunctivitis: Secondary | ICD-10-CM | POA: Diagnosis not present

## 2017-05-28 DIAGNOSIS — Z961 Presence of intraocular lens: Secondary | ICD-10-CM | POA: Diagnosis not present

## 2017-05-28 DIAGNOSIS — H401111 Primary open-angle glaucoma, right eye, mild stage: Secondary | ICD-10-CM | POA: Diagnosis not present

## 2017-05-28 DIAGNOSIS — H35372 Puckering of macula, left eye: Secondary | ICD-10-CM | POA: Diagnosis not present

## 2017-05-28 DIAGNOSIS — B308 Other viral conjunctivitis: Secondary | ICD-10-CM | POA: Diagnosis not present

## 2017-05-28 DIAGNOSIS — H43811 Vitreous degeneration, right eye: Secondary | ICD-10-CM | POA: Diagnosis not present

## 2017-05-28 DIAGNOSIS — H35352 Cystoid macular degeneration, left eye: Secondary | ICD-10-CM | POA: Diagnosis not present

## 2017-05-28 DIAGNOSIS — H2511 Age-related nuclear cataract, right eye: Secondary | ICD-10-CM | POA: Diagnosis not present

## 2017-07-26 ENCOUNTER — Encounter (INDEPENDENT_AMBULATORY_CARE_PROVIDER_SITE_OTHER): Payer: Medicare Other | Admitting: Ophthalmology

## 2017-07-30 ENCOUNTER — Encounter (INDEPENDENT_AMBULATORY_CARE_PROVIDER_SITE_OTHER): Payer: Medicare Other | Admitting: Ophthalmology

## 2017-07-30 DIAGNOSIS — H338 Other retinal detachments: Secondary | ICD-10-CM | POA: Diagnosis not present

## 2017-07-30 DIAGNOSIS — H33301 Unspecified retinal break, right eye: Secondary | ICD-10-CM

## 2017-07-30 DIAGNOSIS — H59031 Cystoid macular edema following cataract surgery, right eye: Secondary | ICD-10-CM

## 2017-07-30 DIAGNOSIS — H35372 Puckering of macula, left eye: Secondary | ICD-10-CM

## 2017-07-30 DIAGNOSIS — H43813 Vitreous degeneration, bilateral: Secondary | ICD-10-CM | POA: Diagnosis not present

## 2017-07-30 DIAGNOSIS — H2511 Age-related nuclear cataract, right eye: Secondary | ICD-10-CM

## 2017-08-09 ENCOUNTER — Ambulatory Visit (INDEPENDENT_AMBULATORY_CARE_PROVIDER_SITE_OTHER): Payer: Medicare Other | Admitting: Family Medicine

## 2017-08-09 ENCOUNTER — Encounter: Payer: Self-pay | Admitting: Family Medicine

## 2017-08-09 VITALS — BP 120/80 | HR 72 | Temp 98.9°F | Ht 69.0 in | Wt 166.0 lb

## 2017-08-09 DIAGNOSIS — J069 Acute upper respiratory infection, unspecified: Secondary | ICD-10-CM

## 2017-08-09 DIAGNOSIS — B9789 Other viral agents as the cause of diseases classified elsewhere: Secondary | ICD-10-CM | POA: Diagnosis not present

## 2017-08-09 NOTE — Progress Notes (Signed)
   Subjective:    Patient ID: Joan Morales, female    DOB: 26-Mar-1940, 77 y.o.   MRN: [id number]  HPI She complains of a 9-day history of started with sore throat as well as PND and coughing.  No sore throat, earache, fever, chills.  She states that the last several days the symptoms have essentially not changed.  She does have underlying allergies but they are not giving her any trouble right now.  She does not smoke.   Review of Systems     Objective:   Physical Exam Alert and in no distress. Tympanic membranes and canals are normal. Pharyngeal area is normal. Neck is supple without adenopathy or thyromegaly. Cardiac exam shows a regular sinus rhythm without murmurs or gallops. Lungs are clear to auscultation.        Assessment & Plan:  Viral URI with cough I recommended continued conservative care however if her symptoms continue to the weekend, she is to call.  She was comfortable with this.      BP 120/80   Pulse 72   Temp 98.9 F (37.2 C) (Tympanic)   Ht 5\' 9"  (1.753 m)   Wt 166 lb (75.3 kg)   BMI 24.51 kg/m

## 2017-08-14 ENCOUNTER — Telehealth: Payer: Self-pay

## 2017-08-14 ENCOUNTER — Other Ambulatory Visit: Payer: Self-pay

## 2017-08-14 ENCOUNTER — Encounter: Payer: Self-pay | Admitting: Medical

## 2017-08-14 ENCOUNTER — Other Ambulatory Visit: Payer: Self-pay | Admitting: Medical

## 2017-08-14 ENCOUNTER — Ambulatory Visit (INDEPENDENT_AMBULATORY_CARE_PROVIDER_SITE_OTHER): Payer: Medicare Other | Admitting: Medical

## 2017-08-14 VITALS — BP 130/70 | HR 79 | Temp 98.1°F | Ht 68.5 in | Wt 164.6 lb

## 2017-08-14 DIAGNOSIS — J301 Allergic rhinitis due to pollen: Secondary | ICD-10-CM | POA: Diagnosis not present

## 2017-08-14 DIAGNOSIS — R05 Cough: Secondary | ICD-10-CM | POA: Diagnosis not present

## 2017-08-14 DIAGNOSIS — R059 Cough, unspecified: Secondary | ICD-10-CM

## 2017-08-14 MED ORDER — PROMETHAZINE-DM 6.25-15 MG/5ML PO SYRP: 5.0000 mL | ORAL_SOLUTION | Freq: Four times a day (QID) | ORAL | 0 refills | Status: DC | PRN

## 2017-08-14 MED ORDER — PREDNISONE 20 MG PO TABS: 40.0000 mg | ORAL_TABLET | Freq: Every day | ORAL | 0 refills | Status: DC

## 2017-08-14 MED ORDER — HYDROCODONE-HOMATROPINE 5-1.5 MG/5ML PO SYRP: ORAL_SOLUTION | ORAL | 0 refills | Status: DC

## 2017-08-14 NOTE — Patient Instructions (Signed)
Recommendations  Rest  Hydrate well with water throughout the day  Consider Mucinex DM over the counter the next 5 days  I prescribed a cough syrup today you can use for worse cough, 1/2- 1 tsp either at bedtime or up to twice daily but use caution as this can make you sleepy  Begin prednisone steroid 2 tablets daily for 3 day for inflammation  Use your albuterol inhaler 2 puffs at least 3 times daily this week  If you don't feel much improved in the next 48 hours, let me know

## 2017-08-14 NOTE — Telephone Encounter (Signed)
Pt called & Hycodan out of stock at [place], [place] called & verfied. [facility] [address] does have it.  [facility] tried to escribe & it would only print and she tried to call in but it is controlled and can't be called in.  Audelia Acton will you escribe this to [place].  Thanks

## 2017-08-14 NOTE — Telephone Encounter (Signed)
Medication [facility] but [facility] did not have this medication in stock at the time. [facility] [address]

## 2017-08-14 NOTE — Progress Notes (Signed)
Subjective:  Joan Morales [place] is a 77 y.o. female who presents for cough.  Been having lots of cough ongoing.   Saw Dr. Redmond School last week for same.  No hx/o asthma, nonsmoker.   Has lots of drainage down back of throat.  Taking Tussin DM.  Coughing up a lot phlegm.  No fever.  No NVD.   No wheezing, no SOB.   No head pressure, sinus or ear pain.   reports sick contacts.  No other aggravating or relieving factors.  No other c/o.  The following portions of the patient's history were reviewed and updated as appropriate: allergies, current medications, past family history, past medical history, past social history, past surgical history and problem list.  ROS as in subjective  Past Medical History:  Diagnosis Date  . Allergic rhinitis, cause unspecified   . Arthritis    "hands" (03/10/2014)  . GERD (gastroesophageal reflux disease)   . Osteopenia    fosamax started 01/2012  . Ovarian teratoma    left, removed 12/06  . PONV (postoperative nausea and vomiting)   . Pure hypercholesterolemia    Current Outpatient Medications on File Prior to Visit  Medication Sig Dispense Refill  . albuterol (PROVENTIL HFA;VENTOLIN HFA) 108 (90 Base) MCG/ACT inhaler Inhale 1-2 puffs into the lungs every 6 (six) hours as needed for wheezing or shortness of breath.    . Ascorbic Acid (VITAMIN C) 1000 MG tablet Take 1,000 mg by mouth 2 (two) times daily.     . Calcium Carbonate-Vitamin D (RA CALCIUM PLUS VITAMIN D) 600-400 MG-UNIT per tablet Take 1 tablet by mouth daily.     . Glucosamine HCl 1000 MG TABS Take 1,000 mg by mouth 2 (two) times daily.    . Omega-3 Fatty Acids (FISH OIL) 1200 MG CAPS Take 1 capsule by mouth 2 (two) times daily.     . vitamin E 400 UNIT capsule Take 400 Units by mouth daily.     No current facility-administered medications on file prior to visit.    ROS as in subjective   Objective: BP 130/70   Pulse 79   Temp 98.1 F (36.7 C) (Oral)   Ht 5' 8.5" (1.74 m)   Wt 164 lb 9.6 oz  (74.7 kg)   SpO2 95%   BMI 24.66 kg/m   Wt Readings from Last 3 Encounters:  08/14/17 164 lb 9.6 oz (74.7 kg)  08/09/17 166 lb (75.3 kg)  10/11/16 165 lb 9.6 oz (75.1 kg)    General appearance: Alert, WD/WN, no distress                             Skin: warm, no rash, no diaphoresis                           Head: no sinus tenderness                            Eyes: conjunctiva normal, corneas clear, PERRLA                            Ears: pearly TMs, external ear canals normal                          Nose: septum midline, turbinates mildly swollen, without erythema and  clear discharge             Mouth/throat: MMM, tongue normal, no pharyngeal erythema                           Neck: supple, no adenopathy, no thyromegaly, nontender                          Heart: RRR, normal S1, S2, no murmurs                         Lungs: +bronchial breath sounds, +no rhonchi, no wheezes, no rales                Extremities: no edema, nontender      Assessment: Encounter Diagnoses  Name Primary?  . Cough Yes  . Allergic rhinitis due to pollen, unspecified seasonality      Plan:  Discussed symptoms and exam suggestive  Likely reactive airway from pollen.   Declines chest xray.   She just renewed inhaler last week.  Reviewed Dr. Lanice Shirts office notes from last week.  Patient Instructions  Recommendations  Rest  Hydrate well with water throughout the day  Consider Mucinex DM over the counter the next 5 days  I prescribed a cough syrup today you can use for worse cough, 1/2- 1 tsp either at bedtime or up to twice daily but use caution as this can make you sleepy  Begin prednisone steroid 2 tablets daily for 3 day for inflammation  Use your albuterol inhaler 2 puffs at least 3 times daily this week  If you don't feel much improved in the next 48 hours, let me know    [place] was seen today for cough.  Diagnoses and all orders for this visit:  Cough  Allergic rhinitis due  to pollen, unspecified seasonality  Other orders -     HYDROcodone-homatropine (HYCODAN) 5-1.5 MG/5ML syrup; 1/2-1 tsp BID or just at bedtime for cough -     predniSONE (DELTASONE) 20 MG tablet; Take 2 tablets (40 mg total) by mouth daily with breakfast.

## 2017-08-14 NOTE — Telephone Encounter (Signed)
Error

## 2017-08-15 ENCOUNTER — Other Ambulatory Visit: Payer: Self-pay | Admitting: Medical

## 2017-08-15 MED ORDER — PROMETHAZINE-DM 6.25-15 MG/5ML PO SYRP: 5.0000 mL | ORAL_SOLUTION | Freq: Four times a day (QID) | ORAL | 0 refills | Status: DC | PRN

## 2017-09-26 DIAGNOSIS — H401111 Primary open-angle glaucoma, right eye, mild stage: Secondary | ICD-10-CM | POA: Diagnosis not present

## 2017-09-26 DIAGNOSIS — B308 Other viral conjunctivitis: Secondary | ICD-10-CM | POA: Diagnosis not present

## 2017-09-26 DIAGNOSIS — H43811 Vitreous degeneration, right eye: Secondary | ICD-10-CM | POA: Diagnosis not present

## 2017-09-26 DIAGNOSIS — H2511 Age-related nuclear cataract, right eye: Secondary | ICD-10-CM | POA: Diagnosis not present

## 2017-09-26 DIAGNOSIS — Z961 Presence of intraocular lens: Secondary | ICD-10-CM | POA: Diagnosis not present

## 2017-09-26 DIAGNOSIS — H35352 Cystoid macular degeneration, left eye: Secondary | ICD-10-CM | POA: Diagnosis not present

## 2017-09-26 DIAGNOSIS — H35372 Puckering of macula, left eye: Secondary | ICD-10-CM | POA: Diagnosis not present

## 2017-11-15 DIAGNOSIS — Z1231 Encounter for screening mammogram for malignant neoplasm of breast: Secondary | ICD-10-CM | POA: Diagnosis not present

## 2017-11-15 LAB — HM MAMMOGRAPHY

## 2017-11-16 ENCOUNTER — Encounter: Payer: Self-pay | Admitting: Family Medicine

## 2017-12-03 ENCOUNTER — Encounter: Payer: Self-pay | Admitting: *Deleted

## 2017-12-04 NOTE — Progress Notes (Signed)
Chief Complaint  Patient presents with  . Medicare Wellness    fasting annual wellness with pelvic. Has some right knee pain. Has been having some sleep issues lately. Once in a while she has some vaginal discharge that is white and odorless and does not itch. She also wonders is she has IBS-has bowel urgency from time to time.     Joan Morales is a 77 y.o. female who presents for annual wellness visit and follow-up on chronic medical conditions.  She mentions the following issues, none of which are acute or extremely bothersome:  Right knee pain--same as in the past, mostly with stairs.  No pain with walking or other activities.    Sleep issues recently.  She falls asleep fine, sleeps deeply, but if she wakes up to go to the bathroom, she has trouble getting back to sleep, her mind doesn't stop.   Intermittent vaginal discharge (white, no odor, no itch), every 6 weeks, very small amount, thin, white. Doesn't currently have.  Denies pelvic pain, spotting, or other issues.  IBS- occasional bowel urgency; denies incontinence. May be worse after a heavy meal.  Seen twice in May with cough, felt to be virus or allergies, with RAD.  Of note, she had similar illness (treated elsewhere) in June 2018. She currently denies any problems.  Osteopenia: She has been on fosamax since 02/2012. Initial T-score was T-2.4, and high risk FRAX score (hip and elsewhere). She previously denied any significant side effects--just noted occasional heartburn which she has sorted out that it is diet related rather than from medications. Denies dysphagia. Last DEXA was 10/2016, which showed improvement in both hips.  She now reports she started having problems with her right jaw, so stopped taking alendronate in July 2018 (shortly after her last visit here).  She continues to have some discomfort, hears some clicking on the right side.  Denies any locking.  Mild hyperlipidemia:  LDL last year was noted to be higher  than prior check. She tries to follow lowfat, low cholesterol diet  Lab Results  Component Value Date   CHOL 240 (H) 10/11/2016   HDL 81 10/11/2016   LDLCALC 145 (H) 10/11/2016   TRIG 70 10/11/2016   CHOLHDL 3.0 10/11/2016   In 2016: Cholesterol 125 - 200 mg/dL 224High    Triglycerides <150 mg/dL 89   HDL >=46 mg/dL 73   Total CHOL/HDL Ratio <=5.0 Ratio 3.1   VLDL <30 mg/dL 18   LDL Cholesterol <130 mg/dL 133High       Immunization History  Administered Date(s) Administered  . Influenza Split 01/04/2012  . Influenza, High Dose Seasonal PF 12/15/2013, 02/26/2015  . Pneumococcal Conjugate-13 06/04/2013  . Pneumococcal Polysaccharide-23 09/17/2008  . Td 10/20/2015  . Tdap 05/07/2005   Last Pap smear: s/p hysterectomy  Last mammogram: 10/2017 Last colonoscopy: 08/2008  Last DEXA: 10/2016 T-1.9 R fem neck (improved from -2.3) and T-1.4 L fem neck (improved from -1.6) Dentist: twice yearly for cleanings Ophtho:  Every 6 months (or more frequently, had detached retina) Exercise: Stopped exercising in October of last year, but restarted this past July/August, walking 30 minutes 3 days/week, working up to 45 minutes.  No weight-bearing exercise. Vitamin D screen: normal 09/2016, level of 36  Other doctors caring for patient include: Dermatologist Dr. Rolm Bookbinder Dentist: Dr. Moss Mc Ortho: Dr. Percell Miller (for her wrist). Retina specialist: Dr. Zigmund Daniel Ophtho: Dr. Katy Fitch GI: Dr. Penelope Coop  Depression screen: Negative  Fall screen: Negative Functional status screen: Notable  only for trouble with stairs due to right knee pain from arthritis, leakage of urine with cough/sneeze, only if sick (not from allergies). (vision is not affected by her retinal issues). Some fecal urgency but no leakage. Mini-Cog screen: normal (score of 5) See full screens in epic.  End of Life Discussion: Patient hasa living will and medical power of attorney  Past Medical History:  Diagnosis  Date  . Allergic rhinitis, cause unspecified   . Arthritis    "hands" (03/10/2014)  . GERD (gastroesophageal reflux disease)   . Osteopenia    fosamax started 01/2012  . Ovarian teratoma    left, removed 12/06  . PONV (postoperative nausea and vomiting)   . Pure hypercholesterolemia     Past Surgical History:  Procedure Laterality Date  . CATARACT EXTRACTION Left 11/2015  . EYE SURGERY    . FRACTURE SURGERY    . GAS INSERTION Left 03/10/2014   Procedure: INSERTION OF [id number];  Surgeon: Hayden Pedro, MD;  Location: [facility];  Service: Ophthalmology;  Laterality: Left;  . LASER PHOTO ABLATION Left 03/10/2014   Procedure: LASER PHOTO ABLATION-HEADSCOPE LASER;  Surgeon: Hayden Pedro, MD;  Location: [facility];  Service: Ophthalmology;  Laterality: Left;  . SCLERAL BUCKLE Left 03/10/2014  . SCLERAL BUCKLE WITH CRYO Left 03/10/2014   Procedure: SCLERAL BUCKLE WITH CRYO AND DIATHERMY;  Surgeon: Hayden Pedro, MD;  Location: [facility];  Service: Ophthalmology;  Laterality: Left;  . TONSILLECTOMY    . TOTAL ABDOMINAL HYSTERECTOMY  02/2005   w/BSO; L ovarian teratoma  . TOTAL ABDOMINAL HYSTERECTOMY W/ BILATERAL SALPINGOOPHORECTOMY  12/06   L ovarian teratoma  . WRIST FRACTURE SURGERY Right 02/26/14   plate and screws    Social History   Socioeconomic History  . Marital status: Married    Spouse name: Not on file  . Number of children: 1  . Years of education: Not on file  . Highest education level: Not on file  Occupational History  . Occupation: retired  Scientific laboratory technician  . Financial resource strain: Not on file  . Food insecurity:    Worry: Not on file    Inability: Not on file  . Transportation needs:    Medical: Not on file    Non-medical: Not on file  Tobacco Use  . Smoking status: Never Smoker  . Smokeless tobacco: Never Used  Substance and Sexual Activity  . Alcohol use: No  . Drug use: No  . Sexual activity: Not Currently    Partners: Male    Comment: husband with  impotence issues  Lifestyle  . Physical activity:    Days per week: Not on file    Minutes per session: Not on file  . Stress: Not on file  Relationships  . Social connections:    Talks on phone: Not on file    Gets together: Not on file    Attends religious service: Not on file    Active member of club or organization: Not on file    Attends meetings of clubs or organizations: Not on file    Relationship status: Not on file  . Intimate partner violence:    Fear of current or ex partner: Not on file    Emotionally abused: Not on file    Physically abused: Not on file    Forced sexual activity: Not on file  Other Topics Concern  . Not on file  Social History Narrative   Married, lives with husband.  Son lives  in [place], [place].  3 grandchildren.  Retired (2013; Engineer, materials).    Family History  Problem Relation Age of Onset  . Heart disease Mother   . COPD Father        smoker  . Fibromyalgia Sister   . Irritable bowel syndrome Sister   . Breast cancer Maternal Grandmother        50's  . Diabetes Paternal Aunt   . Colon cancer Paternal Aunt 96  . Cancer Paternal Aunt 60       colon    Outpatient Encounter Medications as of 12/05/2017  Medication Sig Note  . Ascorbic Acid (VITAMIN C) 1000 MG tablet Take 1,000 mg by mouth 2 (two) times daily.    . Calcium Carbonate-Vitamin D (RA CALCIUM PLUS VITAMIN D) 600-400 MG-UNIT per tablet Take 1 tablet by mouth daily.    . Glucosamine HCl 1000 MG TABS Take 1,000 mg by mouth 2 (two) times daily.   . Omega-3 Fatty Acids (FISH OIL) 1200 MG CAPS Take 1 capsule by mouth 2 (two) times daily.    . vitamin B-12 (CYANOCOBALAMIN) 1000 MCG tablet Take 1,000 mcg by mouth daily. 12/05/2017: Recently added (helps with stamina)  . vitamin E 400 UNIT capsule Take 400 Units by mouth daily.   Marland Kitchen albuterol (PROVENTIL HFA;VENTOLIN HFA) 108 (90 Base) MCG/ACT inhaler Inhale 1-2 puffs into the lungs every 6 (six) hours as needed for wheezing or shortness  of breath.   . [DISCONTINUED] HYDROcodone-homatropine (HYCODAN) 5-1.5 MG/5ML syrup 1/2-1 tsp BID or just at bedtime for cough   . [DISCONTINUED] predniSONE (DELTASONE) 20 MG tablet Take 2 tablets (40 mg total) by mouth daily with breakfast.   . [DISCONTINUED] promethazine-dextromethorphan (PROMETHAZINE-DM) 6.25-15 MG/5ML syrup Take 5 mLs by mouth 4 (four) times daily as needed for cough.    No facility-administered encounter medications on file as of 12/05/2017.     Allergies  Allergen Reactions  . Erythromycin Swelling  . Triple Antibiotic [Bacitracin-Neomycin-Polymyxin] Swelling    Blistering    ROS: The patient denies anorexia, fever, weight changes, headaches, decreased hearing, ear pain, sore throat, breast concerns, chest pain, palpitations, dizziness, syncope, dyspnea on exertion, swelling, nausea, vomiting, diarrhea, constipation, abdominal pain, melena, hematochezia, incontinence, dysuria, vaginal bleeding, discharge, odor or itch, genital lesions, numbness, tingling, weakness, tremor, suspicious skin lesions, depression, anxiety, abnormal bleeding/bruising, or enlarged lymph nodes.  Mild seasonal allergies, uses anthistamines just prn, not daily. Occasional heartburn, uses pepcid as needed (rare) Arthritis in hands/fingers Some arthritis right wrist, s/p fracture. Cough had completely resolved, recently started up related to pollen.  Hasn't been taking any allergy medication.  Some fecal urgency, vaginal discharge, knee pain with stairs, all as mentioned in HPI   PHYSICAL EXAM:  BP 120/72   Pulse 76   Ht 5' 8.25" (1.734 m)   Wt 164 lb 6.4 oz (74.6 kg)   BMI 24.81 kg/m   Wt Readings from Last 3 Encounters:  08/14/17 164 lb 9.6 oz (74.7 kg)  08/09/17 166 lb (75.3 kg)  10/11/16 165 lb 9.6 oz (75.1 kg)   General Appearance:  Alert, cooperative, no distress, appears stated age. Occasional cough and throat-clearing  Head:  Normocephalic, without obvious abnormality,  atraumatic   Eyes:  PERRL, conjunctiva/corneas clear, EOM's intact, fundi benign   Ears:  Normal L TM and external ear canal. Right canal is partially blocked by central hard cerumen, limiting visibility of R TM  Nose:  Nares normal, mucosa with mild-moderate edema and clear mucus; no sinus  tenderness   Throat:  Lips, mucosa, and tongue normal; teeth and gums normal   Neck:  Supple, no lymphadenopathy; thyroid: no enlargement/ tenderness/nodules; no carotid bruit or JVD   Back:  Spine nontender, no curvature, ROM normal, no CVA tenderness   Lungs:  Clear to auscultation bilaterally without wheezes, rales or ronchi; respirations unlabored   Chest Wall:  No tenderness or deformity. Prominent manubrium, nontender  Heart:  Regular rate and rhythm, S1 and S2 normal, no murmur, rub  or gallop   Breast Exam:  No tenderness, masses, or nipple discharge or inversion. No axillary lymphadenopathy   Abdomen:  Soft, non-tender, nondistended, normoactive bowel sounds,  no masses, no hepatosplenomegaly   Genitalia:  Normal external genitalia without lesions, mild atrophic changes. BUS and vagina normal; bimanual exam normal--no pelvic masses appreciable, no tenderness. Pap not performed   Rectal:  Normal tone, no masses or tenderness; guaiac negative stool   Extremities:  No clubbing or cyanosis. Bilateral bunions, hammertoes and callouses. 2nd toes crossed/overlapping great toes bilaterally. Onychomycotic, thickened nails. Arthritic deformity to DIP's in handsbilaterally.  Trace edema at ankles, bilaterally  Pulses:  2+ and symmetric all extremities   Skin:  Skin color, texture, turgor normal, no rashes. Scattered cherry hemangiomas, some SK's, one fleshy-pink soft large papule right upper back(unchanged per pt)  Lymph nodes:  Cervical, supraclavicular, and axillary nodes normal   Neurologic:  CNII-XII intact, normal strength, sensation and gait; reflexes 2+ and symmetric  throughout    Psych:  Normal mood, affect, hygiene and grooming    ASSESSMENT/PLAN:   Medicare annual wellness visit, subsequent - Plan: Comprehensive metabolic panel, Lipid panel, CBC with Differential/Platelet  Hyperlipidemia, unspecified hyperlipidemia type - lowfat, low cholesterol diet reviewed - Plan: Comprehensive metabolic panel, Lipid panel  Osteopenia, unspecified location - s/p alendronate for just under 5 years.  Discussed Ca, D, weight-bearing exercise.  Recheck DEXA 10/2018  Need for influenza vaccination - Plan: Flu vaccine HIGH DOSE PF (Fluzone High dose)  Cough - likely due to PND. Discussed importance of early allergy treatment, especially in the spring, to prevent RAD and more serious illnesses. - Plan: CBC with Differential/Platelet  Allergic rhinitis due to pollen, unspecified seasonality  Colon cancer screening - not due until 08/2018. Discussed options including colonoscopy vs Cologuard   c-met, CBC, lipids  Discussed monthly self breast exams and yearly mammograms; at least 30 minutes of aerobic activity at least 5 days/week, weight-bearing exercise at least 2x/wk; proper sunscreen use reviewed; healthy diet, including goals of calcium and vitamin D intake and alcohol recommendations (less than or equal to 1 drink/day) reviewed; regular seatbelt use; changing batteries in smoke detectors. Immunization recommendations discussed--high dose flu shots recommended yearly given today; Shingrix recommended, to get from pharmacy, risks reviewed. Pneumovax booster next year (will be 10 years from last; had Prevnar-13 four yrs ago). Colonoscopy recommendations reviewed, due in 2020 with Dr. Penelope Coop.  Briefly discussed Cologuard vs colonoscopy as an option when due.  MOST form reviewed and updated/signed. Full code, full care, no prolonged IV fluids/tube feeds   Medicare Attestation I have personally reviewed: The patient's medical and social  history Their use of alcohol, tobacco or illicit drugs Their current medications and supplements The patient's functional ability including ADLs,fall risks, home safety risks, cognitive, and hearing and visual impairment Diet and physical activities Evidence for depression or mood disorders  The patient's weight, height and BMI have been recorded in the chart.  I have made referrals, counseling, and provided education to  the patient based on review of the above and I have provided the patient with a written personalized care plan for preventive services.

## 2017-12-04 NOTE — Patient Instructions (Addendum)
HEALTH MAINTENANCE RECOMMENDATIONS:  It is recommended that you get at least 30 minutes of aerobic exercise at least 5 days/week (for weight loss, you may need as much as 60-90 minutes). This can be any activity that gets your heart rate up. This can be divided in 10-15 minute intervals if needed, but try and build up your endurance at least once a week.  Weight bearing exercise is also recommended twice weekly.  Eat a healthy diet with lots of vegetables, fruits and fiber.  "Colorful" foods have a lot of vitamins (ie green vegetables, tomatoes, red peppers, etc).  Limit sweet tea, regular sodas and alcoholic beverages, all of which has a lot of calories and sugar.  Up to 1 alcoholic drink daily may be beneficial for women (unless trying to lose weight, watch sugars).  Drink a lot of water.  Calcium recommendations are 1200-1500 mg daily (1500 mg for postmenopausal women or women without ovaries), and vitamin D 1000 IU daily.  This should be obtained from diet and/or supplements (vitamins), and calcium should not be taken all at once, but in divided doses.  Monthly self breast exams and yearly mammograms for women over the age of 2 is recommended.  Sunscreen of at least SPF 30 should be used on all sun-exposed parts of the skin when outside between the hours of 10 am and 4 pm (not just when at beach or pool, but even with exercise, golf, tennis, and yard work!)  Use a sunscreen that says "broad spectrum" so it covers both UVA and UVB rays, and make sure to reapply every 1-2 hours.  Remember to change the batteries in your smoke detectors when changing your clock times in the spring and fall.  Use your seat belt every time you are in a car, and please drive safely and not be distracted with cell phones and texting while driving.   Ms. Corrigan , Thank you for taking time to come for your Medicare Wellness Visit. I appreciate your ongoing commitment to your health goals. Please review the following  plan we discussed and let me know if I can assist you in the future.    This is a list of the screening recommended for you and due dates:  Health Maintenance  Topic Date Due  . Flu Shot  10/18/2017  . Mammogram  11/16/2018  . Tetanus Vaccine  10/19/2025  . DEXA scan (bone density measurement)  Completed  . Pneumonia vaccines  Completed   High dose flu shot was given today.  Bone density test should be done again in 10/2018, 2 year follow-up from the last one (having been off medication for the last 2 years).  Colon cancer screening will be due in 2020. We discussed Cologuard testing, vs seeing Dr. Penelope Coop for colonoscopy.  If Cologuard test is abnormal, you will need to have a colonoscopy performed.  I recommend getting the new shingles vaccine (Shingrix). You will need to check with your insurance to see if it is covered, and if covered by Medicare Part D, you need to get from the pharmacy rather than our office.  It is a series of 2 injections, spaced 2 months apart.  We will plan to give you another pneumovax (the "23" kind) next year, as a 10 year booster.  Limit your fluids in the evenings, this way maybe you can sleep longer without having to go to the bathroom, and then you won't have problems getting back to sleep. Keep a pad of paper next  to you at night so if you need to remember something you can jot it down, rather than replaying it over in your mind.  Work on some breathing techniques and mindfulness/meditation techniques to try and free your brain so you can get back to sleep.

## 2017-12-05 ENCOUNTER — Encounter: Payer: Self-pay | Admitting: Family Medicine

## 2017-12-05 ENCOUNTER — Ambulatory Visit (INDEPENDENT_AMBULATORY_CARE_PROVIDER_SITE_OTHER): Payer: Medicare Other | Admitting: Family Medicine

## 2017-12-05 VITALS — BP 120/72 | HR 76 | Ht 68.25 in | Wt 164.4 lb

## 2017-12-05 DIAGNOSIS — J301 Allergic rhinitis due to pollen: Secondary | ICD-10-CM | POA: Diagnosis not present

## 2017-12-05 DIAGNOSIS — Z0000 Encounter for general adult medical examination without abnormal findings: Secondary | ICD-10-CM | POA: Diagnosis not present

## 2017-12-05 DIAGNOSIS — M8580 Other specified disorders of bone density and structure, unspecified site: Secondary | ICD-10-CM | POA: Diagnosis not present

## 2017-12-05 DIAGNOSIS — E785 Hyperlipidemia, unspecified: Secondary | ICD-10-CM | POA: Diagnosis not present

## 2017-12-05 DIAGNOSIS — R05 Cough: Secondary | ICD-10-CM

## 2017-12-05 DIAGNOSIS — R059 Cough, unspecified: Secondary | ICD-10-CM

## 2017-12-05 DIAGNOSIS — Z23 Encounter for immunization: Secondary | ICD-10-CM | POA: Diagnosis not present

## 2017-12-05 DIAGNOSIS — Z1211 Encounter for screening for malignant neoplasm of colon: Secondary | ICD-10-CM | POA: Diagnosis not present

## 2017-12-06 LAB — LIPID PANEL
CHOL/HDL RATIO: 3 ratio (ref 0.0–4.4)
Cholesterol, Total: 228 mg/dL — ABNORMAL HIGH (ref 100–199)
HDL: 76 mg/dL (ref >39)
LDL CALC: 134 mg/dL — AB (ref 0–99)
Triglycerides: 92 mg/dL (ref 0–149)
VLDL Cholesterol Cal: 18 mg/dL (ref 5–40)

## 2017-12-06 LAB — CBC WITH DIFFERENTIAL/PLATELET
BASOS ABS: 0.1 10*3/uL (ref 0.0–0.2)
Basos: 1 %
EOS (ABSOLUTE): 0.2 10*3/uL (ref 0.0–0.4)
Eos: 3 %
HEMOGLOBIN: 12.8 g/dL (ref 11.1–15.9)
Hematocrit: 39.4 % (ref 34.0–46.6)
Immature Grans (Abs): 0 10*3/uL (ref 0.0–0.1)
Immature Granulocytes: 0 %
LYMPHS ABS: 2.5 10*3/uL (ref 0.7–3.1)
LYMPHS: 37 %
MCH: 29.2 pg (ref 26.6–33.0)
MCHC: 32.5 g/dL (ref 31.5–35.7)
MCV: 90 fL (ref 79–97)
MONOCYTES: 11 %
Monocytes Absolute: 0.8 10*3/uL (ref 0.1–0.9)
Neutrophils Absolute: 3.2 10*3/uL (ref 1.4–7.0)
Neutrophils: 48 %
PLATELETS: 268 10*3/uL (ref 150–450)
RBC: 4.39 x10E6/uL (ref 3.77–5.28)
RDW: 13.2 % (ref 12.3–15.4)
WBC: 6.8 10*3/uL (ref 3.4–10.8)

## 2017-12-06 LAB — COMPREHENSIVE METABOLIC PANEL
ALT: 9 IU/L (ref 0–32)
AST: 17 IU/L (ref 0–40)
Albumin/Globulin Ratio: 1.6 (ref 1.2–2.2)
Albumin: 4.4 g/dL (ref 3.5–4.8)
Alkaline Phosphatase: 58 IU/L (ref 39–117)
BUN/Creatinine Ratio: 20 (ref 12–28)
BUN: 15 mg/dL (ref 8–27)
Bilirubin Total: 0.3 mg/dL (ref 0.0–1.2)
CALCIUM: 9.7 mg/dL (ref 8.7–10.3)
CO2: 24 mmol/L (ref 20–29)
CREATININE: 0.75 mg/dL (ref 0.57–1.00)
Chloride: 103 mmol/L (ref 96–106)
GFR, EST AFRICAN AMERICAN: 90 mL/min/{1.73_m2} (ref >59)
GFR, EST NON AFRICAN AMERICAN: 78 mL/min/{1.73_m2} (ref >59)
GLOBULIN, TOTAL: 2.8 g/dL (ref 1.5–4.5)
Glucose: 88 mg/dL (ref 65–99)
Potassium: 4.6 mmol/L (ref 3.5–5.2)
Sodium: 143 mmol/L (ref 134–144)
Total Protein: 7.2 g/dL (ref 6.0–8.5)

## 2018-01-29 DIAGNOSIS — H2511 Age-related nuclear cataract, right eye: Secondary | ICD-10-CM | POA: Diagnosis not present

## 2018-01-29 DIAGNOSIS — H35372 Puckering of macula, left eye: Secondary | ICD-10-CM | POA: Diagnosis not present

## 2018-01-29 DIAGNOSIS — H401122 Primary open-angle glaucoma, left eye, moderate stage: Secondary | ICD-10-CM | POA: Diagnosis not present

## 2018-01-29 DIAGNOSIS — H35352 Cystoid macular degeneration, left eye: Secondary | ICD-10-CM | POA: Diagnosis not present

## 2018-01-29 DIAGNOSIS — H43811 Vitreous degeneration, right eye: Secondary | ICD-10-CM | POA: Diagnosis not present

## 2018-01-29 DIAGNOSIS — B308 Other viral conjunctivitis: Secondary | ICD-10-CM | POA: Diagnosis not present

## 2018-01-29 DIAGNOSIS — Z961 Presence of intraocular lens: Secondary | ICD-10-CM | POA: Diagnosis not present

## 2018-02-06 ENCOUNTER — Encounter (INDEPENDENT_AMBULATORY_CARE_PROVIDER_SITE_OTHER): Payer: Medicare Other | Admitting: Ophthalmology

## 2018-02-06 DIAGNOSIS — H59032 Cystoid macular edema following cataract surgery, left eye: Secondary | ICD-10-CM | POA: Diagnosis not present

## 2018-02-06 DIAGNOSIS — H2511 Age-related nuclear cataract, right eye: Secondary | ICD-10-CM

## 2018-02-06 DIAGNOSIS — H33301 Unspecified retinal break, right eye: Secondary | ICD-10-CM

## 2018-02-06 DIAGNOSIS — H338 Other retinal detachments: Secondary | ICD-10-CM | POA: Diagnosis not present

## 2018-02-06 DIAGNOSIS — H35372 Puckering of macula, left eye: Secondary | ICD-10-CM | POA: Diagnosis not present

## 2018-02-06 DIAGNOSIS — H43813 Vitreous degeneration, bilateral: Secondary | ICD-10-CM | POA: Diagnosis not present

## 2018-03-20 DIAGNOSIS — B029 Zoster without complications: Secondary | ICD-10-CM

## 2018-03-20 DIAGNOSIS — R21 Rash and other nonspecific skin eruption: Secondary | ICD-10-CM | POA: Diagnosis not present

## 2018-03-20 HISTORY — DX: Zoster without complications: B02.9

## 2018-05-10 DIAGNOSIS — D485 Neoplasm of uncertain behavior of skin: Secondary | ICD-10-CM | POA: Diagnosis not present

## 2018-05-10 DIAGNOSIS — D2272 Melanocytic nevi of left lower limb, including hip: Secondary | ICD-10-CM | POA: Diagnosis not present

## 2018-05-10 DIAGNOSIS — L821 Other seborrheic keratosis: Secondary | ICD-10-CM | POA: Diagnosis not present

## 2018-05-10 DIAGNOSIS — L814 Other melanin hyperpigmentation: Secondary | ICD-10-CM | POA: Diagnosis not present

## 2018-05-10 DIAGNOSIS — L570 Actinic keratosis: Secondary | ICD-10-CM | POA: Diagnosis not present

## 2018-05-10 DIAGNOSIS — D2261 Melanocytic nevi of right upper limb, including shoulder: Secondary | ICD-10-CM | POA: Diagnosis not present

## 2018-05-10 DIAGNOSIS — D1801 Hemangioma of skin and subcutaneous tissue: Secondary | ICD-10-CM | POA: Diagnosis not present

## 2018-05-10 DIAGNOSIS — B351 Tinea unguium: Secondary | ICD-10-CM | POA: Diagnosis not present

## 2018-05-10 DIAGNOSIS — L438 Other lichen planus: Secondary | ICD-10-CM | POA: Diagnosis not present

## 2018-05-10 DIAGNOSIS — D3617 Benign neoplasm of peripheral nerves and autonomic nervous system of trunk, unspecified: Secondary | ICD-10-CM | POA: Diagnosis not present

## 2018-05-16 ENCOUNTER — Encounter: Payer: Self-pay | Admitting: Family Medicine

## 2018-06-03 ENCOUNTER — Ambulatory Visit (INDEPENDENT_AMBULATORY_CARE_PROVIDER_SITE_OTHER): Payer: Medicare Other | Admitting: Family Medicine

## 2018-06-03 ENCOUNTER — Encounter: Payer: Self-pay | Admitting: Family Medicine

## 2018-06-03 ENCOUNTER — Other Ambulatory Visit: Payer: Self-pay

## 2018-06-03 VITALS — BP 130/72 | HR 72 | Ht 68.25 in | Wt 166.8 lb

## 2018-06-03 DIAGNOSIS — Z7189 Other specified counseling: Secondary | ICD-10-CM | POA: Diagnosis not present

## 2018-06-03 DIAGNOSIS — Z8619 Personal history of other infectious and parasitic diseases: Secondary | ICD-10-CM | POA: Diagnosis not present

## 2018-06-03 DIAGNOSIS — Z7185 Encounter for immunization safety counseling: Secondary | ICD-10-CM

## 2018-06-03 NOTE — Progress Notes (Signed)
Chief Complaint  Patient presents with  . Consult    hasd shingrix #1 12/20/2017. Diagnosed with shingles 03/20/2018. Would like to get #2 but needs to know if she is cleared of the shingles-still has spots on right cheek.    She started with shingles 03/19/18, left for [facility] the following day, and was seen in UC on 03/20/2018 with pain and blisters in her upper right mouth and at right nasolabial fold, lips.  No eye involvement.  She was treated with valtrex and a shot of steroids. She only took ibuprofen for pain.  It cleared up after 2 weeks.  She still has "splotches" on her face. She denies any pain, numbness, tingling.  She had her first Shingrix in October, and went back to get her second injection.  They refuse to give her the second vaccine due to her still having "splotches" remaining on her face from where she had shingles. She denies any numbness, pain, tingling, just some residual discoloration.   Immunization History  Administered Date(s) Administered  . Influenza Split 01/04/2012  . Influenza, High Dose Seasonal PF 12/15/2013, 02/26/2015, 12/05/2017  . Pneumococcal Conjugate-13 06/04/2013  . Pneumococcal Polysaccharide-23 09/17/2008  . Td 10/20/2015  . Tdap 05/07/2005  . Zoster Recombinat (Shingrix) 12/20/2017   PMH, PSH, SH reviewed  Outpatient Encounter Medications as of 06/03/2018  Medication Sig Note  . Ascorbic Acid (VITAMIN C) 1000 MG tablet Take 1,000 mg by mouth 2 (two) times daily.    . Calcium Carbonate-Vitamin D (RA CALCIUM PLUS VITAMIN D) 600-400 MG-UNIT per tablet Take 1 tablet by mouth daily.    . Glucosamine HCl 1000 MG TABS Take 1,000 mg by mouth 2 (two) times daily.   . Omega-3 Fatty Acids (FISH OIL) 1200 MG CAPS Take 1 capsule by mouth 2 (two) times daily.    . vitamin B-12 (CYANOCOBALAMIN) 1000 MCG tablet Take 1,000 mcg by mouth daily. 12/05/2017: Recently added (helps with stamina)  . vitamin E 400 UNIT capsule Take 400 Units by mouth daily.   Marland Kitchen  albuterol (PROVENTIL HFA;VENTOLIN HFA) 108 (90 Base) MCG/ACT inhaler Inhale 1-2 puffs into the lungs every 6 (six) hours as needed for wheezing or shortness of breath.   . [DISCONTINUED] valACYclovir (VALTREX) 1000 MG tablet     No facility-administered encounter medications on file as of 06/03/2018.    Allergies  Allergen Reactions  . Erythromycin Swelling  . Triple Antibiotic [Bacitracin-Neomycin-Polymyxin] Swelling    Blistering   ROS: no fever, chills, vision problems, URI symptoms, rash (other than the shingles), or other complaints.   PHYSICAL EXAM:  BP 130/72   Pulse 72   Ht 5' 8.25" (1.734 m)   Wt 166 lb 12.8 oz (75.7 kg)   BMI 25.18 kg/m   Well-appearing, pleasant female in no distress HEENT: conjunctiva and sclera are clear, EOMI. OP is clear, no lesions. Slight residual discoloration, slightly papular at the right nasolabial fold (2 areas), redness to the right of the nose and small area on the right side of the nose ,above nasal opening on the side. Neck: no lymphadenopathy.  Prominent salivary glands, nontender and symmetric Psych: normal mood, affect, hygiene and grooming Neuro: alert and oriented, cranial nerves intact, normal gait   ASSESSMENT/PLAN:  History of shingles  Vaccine counseling    Given that you had an episode of shingles infection (between your doses of Shingrix), this actually boosts your natural antibodies to the virus.  I normally tell people to wait to start the Shingles vaccines  series until 6-12 months after an outbreak (there is no rush to start); since yours occurred in the midst of getting the vaccines, I am going to say to get the second Shingrix vaccine 6 months from your outbreak, which will be the beginning of July. Your facial lesion/rash likely will be significantly more faded or resolved by that time as well.  If you still have any facial rash that is keeping the pharmacist from allowing you to have that second vaccine at that time  (July), please let me know, and I'd be happy to write a note.

## 2018-06-03 NOTE — Patient Instructions (Signed)
Given that you had an episode of shingles infection (after your first dose of Shingrix), this actually boosts your natural antibodies to the virus.  I normally tell people to wait to start the Shingles vaccines series until 6-12 months after an outbreak (there is no rush to start); since yours occurred in the midst of getting the vaccines, I am going to say to get the second Shingrix vaccine 6 months from your outbreak, which will be the beginning of July. Your facial lesion/rash likely will be significantly more faded or resolved by that time as well.  If you still have any facial rash that is keeping the pharmacist from allowing you to have that second vaccine at that time (July), please let me know, and I'd be happy to write a note.  Coronavirus (COVID-19) Are you at risk?  Are you at risk for the Coronavirus (COVID-19)?  To be considered HIGH RISK for Coronavirus (COVID-19), you have to meet the following criteria:  . Traveled to [place], [place], [place], [place] or [place]; or in the [place] to [place], [place], [place], or [place]; and have fever, cough, and shortness of breath within the last 2 weeks of travel OR . Been in close contact with a person diagnosed with COVID-19 within the last 2 weeks and have fever, cough, and shortness of breath . IF YOU DO NOT MEET THESE CRITERIA, YOU ARE CONSIDERED LOW RISK FOR COVID-19.  What to do if you are HIGH RISK for COVID-19?  Marland Kitchen If you are having a medical emergency, call 911. . Seek medical care right away. Before you go to a doctor's office, urgent care or emergency department, call ahead and tell them about your recent travel, contact with someone diagnosed with COVID-19, and your symptoms. You should receive instructions from your physician's office regarding next steps of care.  . When you arrive at healthcare provider, tell the healthcare staff immediately you have returned from visiting [place], [place], [place], [place] or [place];  or traveled in the [place] to [place], [place], [place], or [place]; in the last two weeks or you have been in close contact with a person diagnosed with COVID-19 in the last 2 weeks.   . Tell the health care staff about your symptoms: fever, cough and shortness of breath. . After you have been seen by a medical provider, you will be either: o Tested for (COVID-19) and discharged home on quarantine except to seek medical care if symptoms worsen, and asked to  - Stay home and avoid contact with others until you get your results (4-5 days)  - Avoid travel on public transportation if possible (such as bus, train, or airplane) or o Sent to the Emergency Department by EMS for evaluation, COVID-19 testing, and possible admission depending on your condition and test results.  What to do if you are LOW RISK for COVID-19?  Reduce your risk of any infection by using the same precautions used for avoiding the common cold or flu:  Marland Kitchen Wash your hands often with soap and warm water for at least 20 seconds.  If soap and water are not readily available, use an alcohol-based hand sanitizer with at least 60% alcohol.  . If coughing or sneezing, cover your mouth and nose by coughing or sneezing into the elbow areas of your shirt or coat, into a tissue or into your sleeve (not your hands). . Avoid shaking hands with others and consider head nods or verbal greetings  only. . Avoid touching your eyes, nose, or mouth with unwashed hands.  . Avoid close contact with people who are sick. . Avoid places or events with large numbers of people in one location, like concerts or sporting events. . Carefully consider travel plans you have or are making. . If you are planning any travel outside or inside the [name], visit the CDC's Travelers' Health webpage for the latest health notices. . If you have some symptoms but not all symptoms, continue to monitor at home and seek medical attention if your symptoms  worsen. . If you are having a medical emergency, call 911.   [place] / e-Visit: [email or web address]         [facility]: [facility]: [phone]                   [facility]: [phone]

## 2018-08-08 ENCOUNTER — Other Ambulatory Visit: Payer: Self-pay

## 2018-08-08 ENCOUNTER — Encounter (INDEPENDENT_AMBULATORY_CARE_PROVIDER_SITE_OTHER): Payer: Medicare Other | Admitting: Ophthalmology

## 2018-08-08 DIAGNOSIS — H35372 Puckering of macula, left eye: Secondary | ICD-10-CM | POA: Diagnosis not present

## 2018-08-08 DIAGNOSIS — H2511 Age-related nuclear cataract, right eye: Secondary | ICD-10-CM

## 2018-08-08 DIAGNOSIS — H59032 Cystoid macular edema following cataract surgery, left eye: Secondary | ICD-10-CM

## 2018-08-08 DIAGNOSIS — H43813 Vitreous degeneration, bilateral: Secondary | ICD-10-CM

## 2018-08-08 DIAGNOSIS — H338 Other retinal detachments: Secondary | ICD-10-CM

## 2018-08-08 DIAGNOSIS — H33301 Unspecified retinal break, right eye: Secondary | ICD-10-CM

## 2018-08-14 DIAGNOSIS — Z961 Presence of intraocular lens: Secondary | ICD-10-CM | POA: Diagnosis not present

## 2018-08-14 DIAGNOSIS — H04123 Dry eye syndrome of bilateral lacrimal glands: Secondary | ICD-10-CM | POA: Diagnosis not present

## 2018-08-14 DIAGNOSIS — H2511 Age-related nuclear cataract, right eye: Secondary | ICD-10-CM | POA: Diagnosis not present

## 2018-08-14 DIAGNOSIS — H401122 Primary open-angle glaucoma, left eye, moderate stage: Secondary | ICD-10-CM | POA: Diagnosis not present

## 2018-11-21 DIAGNOSIS — Z90710 Acquired absence of both cervix and uterus: Secondary | ICD-10-CM | POA: Diagnosis not present

## 2018-11-21 DIAGNOSIS — Z1231 Encounter for screening mammogram for malignant neoplasm of breast: Secondary | ICD-10-CM | POA: Diagnosis not present

## 2018-11-21 DIAGNOSIS — M1990 Unspecified osteoarthritis, unspecified site: Secondary | ICD-10-CM | POA: Diagnosis not present

## 2018-11-21 DIAGNOSIS — M8589 Other specified disorders of bone density and structure, multiple sites: Secondary | ICD-10-CM | POA: Diagnosis not present

## 2018-11-21 DIAGNOSIS — Z8262 Family history of osteoporosis: Secondary | ICD-10-CM | POA: Diagnosis not present

## 2018-11-21 LAB — HM MAMMOGRAPHY

## 2018-11-21 LAB — HM DEXA SCAN

## 2018-11-28 ENCOUNTER — Encounter: Payer: Self-pay | Admitting: *Deleted

## 2018-12-10 NOTE — Patient Instructions (Addendum)
HEALTH MAINTENANCE RECOMMENDATIONS:  It is recommended that you get at least 30 minutes of aerobic exercise at least 5 days/week (for weight loss, you may need as much as 60-90 minutes). This can be any activity that gets your heart rate up. This can be divided in 10-15 minute intervals if needed, but try and build up your endurance at least once a week.  Weight bearing exercise is also recommended twice weekly.  Eat a healthy diet with lots of vegetables, fruits and fiber.  "Colorful" foods have a lot of vitamins (ie green vegetables, tomatoes, red peppers, etc).  Limit sweet tea, regular sodas and alcoholic beverages, all of which has a lot of calories and sugar.  Up to 1 alcoholic drink daily may be beneficial for women (unless trying to lose weight, watch sugars).  Drink a lot of water.  Calcium recommendations are 1200-1500 mg daily (1500 mg for postmenopausal women or women without ovaries), and vitamin D 1000 IU daily.  This should be obtained from diet and/or supplements (vitamins), and calcium should not be taken all at once, but in divided doses.  Monthly self breast exams and yearly mammograms for women over the age of 70 is recommended.  Sunscreen of at least SPF 30 should be used on all sun-exposed parts of the skin when outside between the hours of 10 am and 4 pm (not just when at beach or pool, but even with exercise, golf, tennis, and yard work!)  Use a sunscreen that says "broad spectrum" so it covers both UVA and UVB rays, and make sure to reapply every 1-2 hours.  Remember to change the batteries in your smoke detectors when changing your clock times in the spring and fall. Carbon monoxide detectors are recommended for your home.  Use your seat belt every time you are in a car, and please drive safely and not be distracted with cell phones and texting while driving.   Ms. Ala , Thank you for taking time to come for your Medicare Wellness Visit. I appreciate your ongoing  commitment to your health goals. Please review the following plan we discussed and let me know if I can assist you in the future.    This is a list of the screening recommended for you and due dates:  Health Maintenance  Topic Date Due  . Flu Shot  10/19/2018  . Mammogram  11/21/2019  . Tetanus Vaccine  10/19/2025  . DEXA scan (bone density measurement)  Completed  . Pneumonia vaccines  Completed   You were given flu shot and pneumovax booster today. We should repeat your next bone density test in 11/2020 (2 year follow-up, looking for any decline, since no longer on treatment).  You should get the second Shingrix from the pharmacy.  You are due for colon cancer screening.  Options include colonoscopy, versus Cologuard testing.  You may want to discuss these options with Dr. Penelope Coop, in light of new diagnosis of colon cancer in your family. If you decide on Cologuard, we are happy to order this for you, let us know.  Since you're having a flare of allergies, contributing to you needing to use albuterol inhaler, you should be on treatment to prevent the drainage in the first place.  This would include antihistamines such as claritin or allegra, and/or use of a nasal steroid spray such as Flonase, Nasacort or Rhinocort. You may also use Guaifenesin (mucinex or robitussin) to help with chest congestion.  Try the neck stretches as shown, after  applying heat, twice daily.  Be sure to keep your books/computer/ipad at eye level, and not be looking down too much, which causes a lot of neck strain.  If your cholesterol is high, it is likely being contributed by your frequent egg intake.  Egg whites or other breakfast choices would be better, if you need to cut back.   Fat and Cholesterol Restricted Eating Plan Getting too much fat and cholesterol in your diet may cause health problems. Choosing the right foods helps keep your fat and cholesterol at normal levels. This can keep you from getting certain  diseases.  Meal planning  At meals, divide your plate into four equal parts: ? Fill one-half of your plate with vegetables and green salads. ? Fill one-fourth of your plate with whole grains. ? Fill one-fourth of your plate with low-fat (lean) protein foods.  Eat fish that is high in omega-3 fats at least two times a week. This includes mackerel, tuna, sardines, and salmon.  Eat foods that are high in fiber, such as whole grains, beans, apples, broccoli, carrots, peas, and barley. General tips   Work with your doctor to lose weight if you need to.  Avoid: ? Foods with added sugar. ? Fried foods. ? Foods with partially hydrogenated oils.  Limit alcohol intake to no more than 1 drink a day for nonpregnant women and 2 drinks a day for men. One drink equals 12 oz of beer, 5 oz of wine, or 1 oz of hard liquor. Reading food labels  Check food labels for: ? Trans fats. ? Partially hydrogenated oils. ? Saturated fat (g) in each serving. ? Cholesterol (mg) in each serving. ? Fiber (g) in each serving.  Choose foods with healthy fats, such as: ? Monounsaturated fats. ? Polyunsaturated fats. ? Omega-3 fats.  Choose grain products that have whole grains. Look for the word "whole" as the first word in the ingredient list. Cooking  Cook foods using low-fat methods. These include baking, boiling, grilling, and broiling.  Eat more home-cooked foods. Eat at restaurants and buffets less often.  Avoid cooking using saturated fats, such as butter, cream, palm oil, palm kernel oil, and coconut oil.   Recommended foods  Fruits  All fresh, canned (in natural juice), or frozen fruits. Vegetables  Fresh or frozen vegetables (raw, steamed, roasted, or grilled). Green salads. Grains  Whole grains, such as whole wheat or whole grain breads, crackers, cereals, and pasta. Unsweetened oatmeal, bulgur, barley, quinoa, or brown rice. Corn or whole wheat flour tortillas. Meats and other  protein foods  Ground beef (85% or leaner), grass-fed beef, or beef trimmed of fat. Skinless chicken or [place]. Ground chicken or [place]. Pork trimmed of fat. All fish and seafood. Egg whites. Dried beans, peas, or lentils. Unsalted nuts or seeds. Unsalted canned beans. Nut butters without added sugar or oil. Dairy  Low-fat or nonfat dairy products, such as skim or 1% milk, 2% or reduced-fat cheeses, low-fat and fat-free ricotta or cottage cheese, or plain low-fat and nonfat yogurt. Fats and oils  Tub margarine without trans fats. Light or reduced-fat mayonnaise and salad dressings. Avocado. Olive, canola, sesame, or safflower oils. The items listed above may not be a complete list of foods and beverages you can eat. Contact a dietitian for more information.  Foods to avoid Fruits  Canned fruit in heavy syrup. Fruit in cream or butter sauce. Fried fruit. Vegetables  Vegetables cooked in cheese, cream, or butter sauce. Fried vegetables. Grains  White bread.  White pasta. White rice. Cornbread. Bagels, pastries, and croissants. Crackers and snack foods that contain trans fat and hydrogenated oils. Meats and other protein foods  Fatty cuts of meat. Ribs, chicken wings, bacon, sausage, bologna, salami, chitterlings, fatback, hot dogs, bratwurst, and packaged lunch meats. Liver and organ meats. Whole eggs and egg yolks. Chicken and [place] with skin. Fried meat. Dairy  Whole or 2% milk, cream, half-and-half, and cream cheese. Whole milk cheeses. Whole-fat or sweetened yogurt. Full-fat cheeses. Nondairy creamers and whipped toppings. Processed cheese, cheese spreads, and cheese curds. Beverages  Alcohol. Sugar-sweetened drinks such as sodas, lemonade, and fruit drinks. Fats and oils  Butter, stick margarine, lard, shortening, ghee, or bacon fat. Coconut, palm kernel, and palm oils. Sweets and desserts  Corn syrup, sugars, honey, and molasses. Candy. Jam and jelly. Syrup. Sweetened cereals.  Cookies, pies, cakes, donuts, muffins, and ice cream. The items listed above may not be a complete list of foods and beverages you should avoid. Contact a dietitian for more information. Summary  Choosing the right foods helps keep your fat and cholesterol at normal levels. This can keep you from getting certain diseases.  At meals, fill one-half of your plate with vegetables and green salads.  Eat high-fiber foods, like whole grains, beans, apples, carrots, peas, and barley.  Limit added sugar, saturated fats, alcohol, and fried foods. This information is not intended to replace advice given to you by your health care provider. Make sure you discuss any questions you have with your health care provider. Document Released: 09/05/2011 Document Revised: 11/07/2017 Document Reviewed: 11/21/2016 Elsevier Patient Education  2020 [employer].

## 2018-12-10 NOTE — Progress Notes (Signed)
Chief Complaint  Patient presents with  . Medicare Wellness    fasting AWV with pelvic. She is having some neck pain x couple months. She thinks it may be a pinched nerve.     Joan Morales is a 78 y.o. female who presents for annual wellness visit and follow-up on chronic medical conditions.  She has the following concerns:  She is complaining of right sided neck pain for a few months.  Can't recall how/when exactly it started.  Pain comes and goes. Pain is limited to the muscles in the right neck. No radiation into her shoulder or arm, no numbness or tingling in arm/fingers. Heat from the shower helps, massaging it herself also helps resolve the pain.  Allergies have been flaring since Saturday, causing some chest congestion and cough. She has been using her albuterol, which has helped.  Osteopenia: She started on fosamax in 02/2012. Initial T-score was T-2.4, and high risk FRAX score (hip and elsewhere).She previously denied any significant side effects--just noted occasional heartburn which improved with dietary changes. Never had dysphagia. Last year she reported having problems with her right jaw, and stopped taking the alendronate in 09/2016.  She has some persistent discomfort, clicking on the right side. No locking. She had DEXA repeated on 11/21/2018.  T-2 at R fem neck, -1.3 at L fem neck, -1.6 at total L femur; reportedly stable at hips.  Mild hyperlipidemia:  LDL in 2018 was up to 145, higher than prior checks. Recheck last year was lower.  She tries to follow lowfat, low cholesterol diet. Eating more eggs (at [employer]), 4x'week (doesn't have cheese on it, but sometimes a bit of bacon). Infrequent red meat (burger every 2-3 weeks).   Lab Results  Component Value Date   CHOL 228 (H) 12/05/2017   HDL 76 12/05/2017   LDLCALC 134 (H) 12/05/2017   TRIG 92 12/05/2017   CHOLHDL 3.0 12/05/2017    Other items mentioned last year, with updates: Right knee pain--same as in the past,  mostly with stairs.  No pain with walking or other activities.    IBS- occasional bowel urgency; denies incontinence. May be worse after a heavy meal.  Unchanged.  Immunization History  Administered Date(s) Administered  . Influenza Split 01/04/2012  . Influenza, High Dose Seasonal PF 12/15/2013, 02/26/2015, 12/05/2017  . Pneumococcal Conjugate-13 06/04/2013  . Pneumococcal Polysaccharide-23 09/17/2008  . Td 10/20/2015  . Tdap 05/07/2005  . Zoster Recombinat (Shingrix) 12/20/2017   Last Pap smear: s/p hysterectomy  Last mammogram:11/2018  Last colonoscopy: 08/2008 (diverticulosis) Last DEXA: 11/2018 T-2 R fem neck, -1.3 L fem neck, -1.6 total L femur (reportedly stable at hips); prior DEXA 10/2016 T-1.9 R fem neck (improved from -2.3) and T-1.4 L fem neck (improved from -1.6) Dentist: twice yearly for cleanings Ophtho:Every 6 months(or more frequently, had detached retina) Exercise: walking regularly until COVID, when her son's family moved in with them (they have since left).  Currently trying to get back into walking, just a few times. No weight-bearing exercise. Has some hand weights at home. Vitamin D screen: normal 09/2016, level of 36  Other doctors caring for patient include: Dermatologist Dr. Rolm Bookbinder Dentist: Dr. Moss Mc Ortho: Dr. Percell Miller (for her wrist). Retina specialist: Dr. Zigmund Daniel Ophtho:Dr. Katy Fitch GI: Dr. Penelope Coop  Depression screen: Negative  Fall screen:Negative Functional status screen: Notable only for trouble with stairs due to right knee pain from arthritis, leakage of urine with cough/sneeze, only if sick (not from allergies).  Some fecal urgency  but no leakage. Mini-Cog screen: normal (score of 5) See full screens in epic.  End of Life Discussion: Patient hasa living will and medical power of attorney.  Thinking about looking at this again.  Past Medical History:  Diagnosis Date  . Allergic rhinitis, cause unspecified   . Arthritis     "hands" (03/10/2014)  . GERD (gastroesophageal reflux disease)   . Osteopenia    fosamax started 01/2012  . Ovarian teratoma    left, removed 12/06  . PONV (postoperative nausea and vomiting)   . Pure hypercholesterolemia   . Shingles 03/2018   dx'd while in [facility], on right side of mouth, nose, lip    Past Surgical History:  Procedure Laterality Date  . CATARACT EXTRACTION Left 11/2015  . EYE SURGERY    . FRACTURE SURGERY    . GAS INSERTION Left 03/10/2014   Procedure: INSERTION OF [id number];  Surgeon: Hayden Pedro, MD;  Location: [facility];  Service: Ophthalmology;  Laterality: Left;  . LASER PHOTO ABLATION Left 03/10/2014   Procedure: LASER PHOTO ABLATION-HEADSCOPE LASER;  Surgeon: Hayden Pedro, MD;  Location: [place];  Service: Ophthalmology;  Laterality: Left;  . SCLERAL BUCKLE Left 03/10/2014  . SCLERAL BUCKLE WITH CRYO Left 03/10/2014   Procedure: SCLERAL BUCKLE WITH CRYO AND DIATHERMY;  Surgeon: Hayden Pedro, MD;  Location: [place];  Service: Ophthalmology;  Laterality: Left;  . TONSILLECTOMY    . TOTAL ABDOMINAL HYSTERECTOMY  02/2005   w/BSO; L ovarian teratoma  . TOTAL ABDOMINAL HYSTERECTOMY W/ BILATERAL SALPINGOOPHORECTOMY  12/06   L ovarian teratoma  . WRIST FRACTURE SURGERY Right 02/26/14   plate and screws    Social History   Socioeconomic History  . Marital status: Married    Spouse name: Not on file  . Number of children: 1  . Years of education: Not on file  . Highest education level: Not on file  Occupational History  . Occupation: retired  Scientific laboratory technician  . Financial resource strain: Not on file  . Food insecurity    Worry: Not on file    Inability: Not on file  . Transportation needs    Medical: Not on file    Non-medical: Not on file  Tobacco Use  . Smoking status: Never Smoker  . Smokeless tobacco: Never Used  Substance and Sexual Activity  . Alcohol use: No  . Drug use: No  . Sexual activity: Not Currently    Partners: Male     Comment: husband with impotence issues  Lifestyle  . Physical activity    Days per week: Not on file    Minutes per session: Not on file  . Stress: Not on file  Relationships  . Social Herbalist on phone: Not on file    Gets together: Not on file    Attends religious service: Not on file    Active member of club or organization: Not on file    Attends meetings of clubs or organizations: Not on file    Relationship status: Not on file  . Intimate partner violence    Fear of current or ex partner: Not on file    Emotionally abused: Not on file    Physically abused: Not on file    Forced sexual activity: Not on file  Other Topics Concern  . Not on file  Social History Narrative   Married, lives with husband.  Son lives in [place], [place].  3 grandchildren.  Retired (2013;  Engineer, materials).    Family History  Problem Relation Age of Onset  . Heart disease Mother   . COPD Father        smoker  . Fibromyalgia Sister   . Irritable bowel syndrome Sister   . Breast cancer Maternal Grandmother        50's  . Diabetes Paternal Aunt   . Colon cancer Paternal Aunt 96  . Cancer Paternal Aunt 22       colon  . Asthma Paternal Grandmother     Outpatient Encounter Medications as of 12/11/2018  Medication Sig Note  . albuterol (VENTOLIN HFA) 108 (90 Base) MCG/ACT inhaler Inhale 1-2 puffs into the lungs every 6 (six) hours as needed for wheezing or shortness of breath.   . Ascorbic Acid (VITAMIN C) 1000 MG tablet Take 1,000 mg by mouth 2 (two) times daily.    . Calcium Carbonate-Vitamin D (RA CALCIUM PLUS VITAMIN D) 600-400 MG-UNIT per tablet Take 1 tablet by mouth daily.    . Glucosamine HCl 1000 MG TABS Take 1,000 mg by mouth 2 (two) times daily.   . Omega-3 Fatty Acids (FISH OIL) 1200 MG CAPS Take 1 capsule by mouth 2 (two) times daily.    . vitamin B-12 (CYANOCOBALAMIN) 1000 MCG tablet Take 1,000 mcg by mouth daily. 12/05/2017: Recently added (helps with stamina)  . vitamin  E 400 UNIT capsule Take 400 Units by mouth daily.   . [DISCONTINUED] albuterol (PROVENTIL HFA;VENTOLIN HFA) 108 (90 Base) MCG/ACT inhaler Inhale 1-2 puffs into the lungs every 6 (six) hours as needed for wheezing or shortness of breath.    No facility-administered encounter medications on file as of 12/11/2018.     Allergies  Allergen Reactions  . Erythromycin Swelling  . Triple Antibiotic [Bacitracin-Neomycin-Polymyxin] Swelling    Blistering     ROS: The patient denies anorexia, fever, weight changes, headaches, decreased hearing, ear pain, sore throat, breast concerns, chest pain, palpitations, dizziness, syncope, dyspnea on exertion, swelling, nausea, vomiting, diarrhea, constipation, abdominal pain, melena, hematochezia, incontinence, dysuria, vaginal bleeding, discharge, odor or itch, genital lesions, numbness, tingling, weakness, tremor, suspicious skin lesions, depression, anxiety, abnormal bleeding/bruising, or enlarged lymph nodes.  She had skin lesion removed 04/2018 from upper right back--neurofibroma Occasional heartburn, uses pepcid as needed(rare) Denies pain in hands/fingers/wrists (prev reported arthritis). Occasional knee pain with stairs. Allergies are flaring, with postnasal drip, some cough. Albuterol helps.   PHYSICAL EXAM:  BP 130/78   Pulse 72   Temp (!) 97.3 F (36.3 C) (Other (Comment))   Ht 5\' 9"  (1.753 m)   Wt 162 lb 3.2 oz (73.6 kg)   BMI 23.95 kg/m   Wt Readings from Last 3 Encounters:  12/11/18 162 lb 3.2 oz (73.6 kg)  06/03/18 166 lb 12.8 oz (75.7 kg)  12/05/17 164 lb 6.4 oz (74.6 kg)    General Appearance:  Alert, cooperative, no distress, appears stated age.   Head:  Normocephalic, without obvious abnormality, atraumatic   Eyes:  PERRL, conjunctiva/corneas clear, EOM's intact, fundibenign  Ears:  Normal L TM and external ear canal. Right canal is partially blocked by soft cerumen,visualized portion of TM is normal  Nose:  Not  examined, wearing mask due to COVID-19 pandemic   Throat:  Not examined, wearing mask due to COVID-19 pandemic   Neck:  Supple, no lymphadenopathy; thyroid: no enlargement/ tenderness/nodules; no carotid bruit or JVD   Back:  Spine nontender, no curvature, ROM normal, no CVA tenderness. Area of discomfort is at the  right paraspinous muscle, only minimally tender on today's exam, no spasm  Lungs:  Clear to auscultation bilaterally without wheezes, rales or ronchi; respirations unlabored   Chest Wall:  No tenderness or deformity. Prominent manubrium, nontender  Heart:  Regular rate and rhythm, S1 and S2 normal, no murmur, rub  or gallop   Breast Exam:  No tenderness, masses, or nipple discharge or inversion. No axillary lymphadenopathy   Abdomen:  Soft, non-tender, nondistended, normoactive bowel sounds,  no masses, no hepatosplenomegaly   Genitalia:  Normal external genitalia without lesions, mild atrophic changes. BUS and vagina normal; bimanual exam normal--no pelvic masses appreciable, no tenderness. Pap not performed   Rectal:  Normal tone, no masses or tenderness; guaiac negative stool   Extremities:  No clubbing or cyanosis. Bilateral bunions, hammertoes and callouses. 2nd toes crossed/overlapping great toes bilaterally. Onychomycotic, thickened nails. Arthritic deformity to DIP's in handsbilaterally. Trace edema at ankles, bilaterally  Pulses:  2+ and symmetric all extremities   Skin:  Skin color, texture, turgor normal, no rashes.Scattered cherry hemangiomas, some SK's. Slight erythema/scar at area of biopsy (where lesion was removed in 04/2018)  Lymph nodes:  Cervical, supraclavicular, and axillary nodes normal   Neurologic:  Normal strength, sensation and gait; reflexes 2+ and symmetric throughout    Psych: Normal mood, affect, hygiene and grooming   ASSESSMENT/PLAN:  Medicare annual wellness visit, subsequent  Osteopenia,  unspecified location - discussed Ca, D, weight-bearing exercise. Recheck 2 years  Need for influenza vaccination - Plan: Flu Vaccine QUAD High Dose(Fluad), Pneumococcal polysaccharide vaccine 23-valent greater than or equal to 2yo subcutaneous/IM  Need for Streptococcus pneumoniae vaccination  Allergic rhinitis due to pollen, unspecified seasonality  Colon cancer screening - due for screening, discussed colonoscopy vs Cologuard, can discuss with GI and let us know if wants Cologuard referral  Hyperlipidemia, unspecified hyperlipidemia type - Plan: Comprehensive metabolic panel, Lipid panel  Seasonal allergies - not treating curently, just using albuterol; encouraged either oral antihistamine and/or nasal steroids, guaifenesin prn  Mild intermittent reactive airway disease without complication - Plan: albuterol (VENTOLIN HFA) 108 (90 Base) MCG/ACT inhaler   Took alendronate for just under 5 years.  Osteopenia is stable, okay to remain off and recheck DEXA in 2 years.  Discussed monthly self breast exams and yearly mammograms; at least 30 minutes of aerobic activity at least 5 days/week, weight-bearing exercise at least 2x/wk; proper sunscreen use reviewed; healthy diet, including goals of calcium and vitamin D intake and alcohol recommendations (less than or equal to 1 drink/day) reviewed; regular seatbelt use; changing batteries in smoke detectors. Immunization recommendations discussed--high dose flu shots recommended yearly, given today; Pneumovax booster given today. Recommend getting second dose of Shingrix (delayed due to having gotten shingles in the interim, 02/2018). Colonoscopy recommendations reviewed, due now--can discuss with Dr. Penelope Coop whether she truly needs this, vs getting Cologuard (and having colonoscopy only if Cologuard is abnormal). Repeat DEXA 11/2020.  MOST form reviewed and updated/signed. Full code, full care, no prolonged IV fluids/tube feeds  Since you're having a  flare of allergies, contributing to you needing to use albuterol inhaler, you should be on treatment to prevent the drainage in the first place.  This would include antihistamines such as claritin or allegra, and/or use of a nasal steroid spray such as Flonase, Nasacort or Rhinocort. You may also use Guaifenesin (mucinex or robitussin) to help with chest congestion.  Medicare Attestation I have personally reviewed: The patient's medical and social history Their use of alcohol, tobacco or illicit  drugs Their current medications and supplements The patient's functional ability including ADLs,fall risks, home safety risks, cognitive, and hearing and visual impairment Diet and physical activities Evidence for depression or mood disorders  The patient's weight, height, BMI, and visual acuity have been recorded in the chart.  I have made referrals, counseling, and provided education to the patient based on review of the above and I have provided the patient with a written personalized care plan for preventive services.

## 2018-12-11 ENCOUNTER — Other Ambulatory Visit: Payer: Self-pay

## 2018-12-11 ENCOUNTER — Encounter: Payer: Self-pay | Admitting: Family Medicine

## 2018-12-11 ENCOUNTER — Ambulatory Visit (INDEPENDENT_AMBULATORY_CARE_PROVIDER_SITE_OTHER): Payer: Medicare Other | Admitting: Family Medicine

## 2018-12-11 VITALS — BP 130/78 | HR 72 | Temp 97.3°F | Ht 69.0 in | Wt 162.2 lb

## 2018-12-11 DIAGNOSIS — J4520 Mild intermittent asthma, uncomplicated: Secondary | ICD-10-CM

## 2018-12-11 DIAGNOSIS — Z0000 Encounter for general adult medical examination without abnormal findings: Secondary | ICD-10-CM | POA: Diagnosis not present

## 2018-12-11 DIAGNOSIS — Z23 Encounter for immunization: Secondary | ICD-10-CM

## 2018-12-11 DIAGNOSIS — Z1211 Encounter for screening for malignant neoplasm of colon: Secondary | ICD-10-CM

## 2018-12-11 DIAGNOSIS — J302 Other seasonal allergic rhinitis: Secondary | ICD-10-CM | POA: Diagnosis not present

## 2018-12-11 DIAGNOSIS — M8580 Other specified disorders of bone density and structure, unspecified site: Secondary | ICD-10-CM

## 2018-12-11 DIAGNOSIS — J301 Allergic rhinitis due to pollen: Secondary | ICD-10-CM | POA: Diagnosis not present

## 2018-12-11 DIAGNOSIS — E785 Hyperlipidemia, unspecified: Secondary | ICD-10-CM | POA: Diagnosis not present

## 2018-12-11 MED ORDER — ALBUTEROL SULFATE HFA 108 (90 BASE) MCG/ACT IN AERS: 1.0000 | INHALATION_SPRAY | Freq: Four times a day (QID) | RESPIRATORY_TRACT | 1 refills | Status: DC | PRN

## 2018-12-12 LAB — LIPID PANEL
Chol/HDL Ratio: 3.1 ratio (ref 0.0–4.4)
Cholesterol, Total: 238 mg/dL — ABNORMAL HIGH (ref 100–199)
HDL: 78 mg/dL (ref >39)
LDL Chol Calc (NIH): 145 mg/dL — ABNORMAL HIGH (ref 0–99)
Triglycerides: 90 mg/dL (ref 0–149)
VLDL Cholesterol Cal: 15 mg/dL (ref 5–40)

## 2018-12-12 LAB — COMPREHENSIVE METABOLIC PANEL
ALT: 9 IU/L (ref 0–32)
AST: 15 IU/L (ref 0–40)
Albumin/Globulin Ratio: 1.8 (ref 1.2–2.2)
Albumin: 4.5 g/dL (ref 3.7–4.7)
Alkaline Phosphatase: 61 IU/L (ref 39–117)
BUN/Creatinine Ratio: 19 (ref 12–28)
BUN: 15 mg/dL (ref 8–27)
Bilirubin Total: 0.3 mg/dL (ref 0.0–1.2)
CO2: 25 mmol/L (ref 20–29)
Calcium: 10 mg/dL (ref 8.7–10.3)
Chloride: 104 mmol/L (ref 96–106)
Creatinine, Ser: 0.81 mg/dL (ref 0.57–1.00)
GFR calc Af Amer: 81 mL/min/{1.73_m2} (ref >59)
GFR calc non Af Amer: 70 mL/min/{1.73_m2} (ref >59)
Globulin, Total: 2.5 g/dL (ref 1.5–4.5)
Glucose: 92 mg/dL (ref 65–99)
Potassium: 4.5 mmol/L (ref 3.5–5.2)
Sodium: 142 mmol/L (ref 134–144)
Total Protein: 7 g/dL (ref 6.0–8.5)

## 2018-12-13 DIAGNOSIS — H43811 Vitreous degeneration, right eye: Secondary | ICD-10-CM | POA: Diagnosis not present

## 2018-12-13 DIAGNOSIS — H2511 Age-related nuclear cataract, right eye: Secondary | ICD-10-CM | POA: Diagnosis not present

## 2018-12-13 DIAGNOSIS — H35372 Puckering of macula, left eye: Secondary | ICD-10-CM | POA: Diagnosis not present

## 2018-12-13 DIAGNOSIS — Z961 Presence of intraocular lens: Secondary | ICD-10-CM | POA: Diagnosis not present

## 2018-12-13 DIAGNOSIS — H401122 Primary open-angle glaucoma, left eye, moderate stage: Secondary | ICD-10-CM | POA: Diagnosis not present

## 2018-12-13 DIAGNOSIS — H04123 Dry eye syndrome of bilateral lacrimal glands: Secondary | ICD-10-CM | POA: Diagnosis not present

## 2019-02-10 ENCOUNTER — Encounter (INDEPENDENT_AMBULATORY_CARE_PROVIDER_SITE_OTHER): Payer: Medicare Other | Admitting: Ophthalmology

## 2019-02-10 DIAGNOSIS — H2511 Age-related nuclear cataract, right eye: Secondary | ICD-10-CM

## 2019-02-10 DIAGNOSIS — H33301 Unspecified retinal break, right eye: Secondary | ICD-10-CM | POA: Diagnosis not present

## 2019-02-10 DIAGNOSIS — H59032 Cystoid macular edema following cataract surgery, left eye: Secondary | ICD-10-CM

## 2019-02-10 DIAGNOSIS — H43813 Vitreous degeneration, bilateral: Secondary | ICD-10-CM | POA: Diagnosis not present

## 2019-02-10 DIAGNOSIS — H338 Other retinal detachments: Secondary | ICD-10-CM

## 2019-04-16 DIAGNOSIS — H2511 Age-related nuclear cataract, right eye: Secondary | ICD-10-CM | POA: Diagnosis not present

## 2019-04-16 DIAGNOSIS — Z961 Presence of intraocular lens: Secondary | ICD-10-CM | POA: Diagnosis not present

## 2019-04-16 DIAGNOSIS — H43811 Vitreous degeneration, right eye: Secondary | ICD-10-CM | POA: Diagnosis not present

## 2019-04-16 DIAGNOSIS — H401122 Primary open-angle glaucoma, left eye, moderate stage: Secondary | ICD-10-CM | POA: Diagnosis not present

## 2019-04-16 DIAGNOSIS — H35372 Puckering of macula, left eye: Secondary | ICD-10-CM | POA: Diagnosis not present

## 2019-04-16 DIAGNOSIS — H04123 Dry eye syndrome of bilateral lacrimal glands: Secondary | ICD-10-CM | POA: Diagnosis not present

## 2019-07-23 DIAGNOSIS — D2271 Melanocytic nevi of right lower limb, including hip: Secondary | ICD-10-CM | POA: Diagnosis not present

## 2019-07-23 DIAGNOSIS — L570 Actinic keratosis: Secondary | ICD-10-CM | POA: Diagnosis not present

## 2019-07-23 DIAGNOSIS — D2239 Melanocytic nevi of other parts of face: Secondary | ICD-10-CM | POA: Diagnosis not present

## 2019-07-23 DIAGNOSIS — L821 Other seborrheic keratosis: Secondary | ICD-10-CM | POA: Diagnosis not present

## 2019-07-23 DIAGNOSIS — L814 Other melanin hyperpigmentation: Secondary | ICD-10-CM | POA: Diagnosis not present

## 2019-07-23 DIAGNOSIS — B353 Tinea pedis: Secondary | ICD-10-CM | POA: Diagnosis not present

## 2019-07-23 DIAGNOSIS — D1801 Hemangioma of skin and subcutaneous tissue: Secondary | ICD-10-CM | POA: Diagnosis not present

## 2019-07-23 DIAGNOSIS — L738 Other specified follicular disorders: Secondary | ICD-10-CM | POA: Diagnosis not present

## 2019-07-23 DIAGNOSIS — B351 Tinea unguium: Secondary | ICD-10-CM | POA: Diagnosis not present

## 2019-07-23 DIAGNOSIS — D2272 Melanocytic nevi of left lower limb, including hip: Secondary | ICD-10-CM | POA: Diagnosis not present

## 2019-08-13 ENCOUNTER — Encounter (INDEPENDENT_AMBULATORY_CARE_PROVIDER_SITE_OTHER): Payer: Medicare Other | Admitting: Ophthalmology

## 2019-08-13 ENCOUNTER — Other Ambulatory Visit: Payer: Self-pay

## 2019-08-13 DIAGNOSIS — H338 Other retinal detachments: Secondary | ICD-10-CM | POA: Diagnosis not present

## 2019-08-13 DIAGNOSIS — H35372 Puckering of macula, left eye: Secondary | ICD-10-CM | POA: Diagnosis not present

## 2019-08-13 DIAGNOSIS — H33301 Unspecified retinal break, right eye: Secondary | ICD-10-CM

## 2019-08-13 DIAGNOSIS — H59032 Cystoid macular edema following cataract surgery, left eye: Secondary | ICD-10-CM | POA: Diagnosis not present

## 2019-08-13 DIAGNOSIS — H43813 Vitreous degeneration, bilateral: Secondary | ICD-10-CM | POA: Diagnosis not present

## 2019-08-13 DIAGNOSIS — H2511 Age-related nuclear cataract, right eye: Secondary | ICD-10-CM | POA: Diagnosis not present

## 2019-08-15 DIAGNOSIS — H43811 Vitreous degeneration, right eye: Secondary | ICD-10-CM | POA: Diagnosis not present

## 2019-08-15 DIAGNOSIS — H35372 Puckering of macula, left eye: Secondary | ICD-10-CM | POA: Diagnosis not present

## 2019-08-15 DIAGNOSIS — H2511 Age-related nuclear cataract, right eye: Secondary | ICD-10-CM | POA: Diagnosis not present

## 2019-08-15 DIAGNOSIS — H401122 Primary open-angle glaucoma, left eye, moderate stage: Secondary | ICD-10-CM | POA: Diagnosis not present

## 2019-08-15 DIAGNOSIS — Z961 Presence of intraocular lens: Secondary | ICD-10-CM | POA: Diagnosis not present

## 2019-08-15 DIAGNOSIS — H04123 Dry eye syndrome of bilateral lacrimal glands: Secondary | ICD-10-CM | POA: Diagnosis not present

## 2019-12-10 DIAGNOSIS — Z1231 Encounter for screening mammogram for malignant neoplasm of breast: Secondary | ICD-10-CM | POA: Diagnosis not present

## 2019-12-10 LAB — HM MAMMOGRAPHY

## 2019-12-16 ENCOUNTER — Encounter: Payer: Self-pay | Admitting: Family Medicine

## 2019-12-27 NOTE — Progress Notes (Signed)
Chief Complaint  Patient presents with   Medicare Wellness    fasting AWV. Due to covid did have colonoscopy or see GI last year. Is having some constipation issues. Taking pre and pro biotic and that is helping.    Immunizations    didn't get 2nd shingles vaccine from pharmacy. Would like to do HD flu and booster 2 weeks apart. Would like to do booster today.    Joan Morales is a 79 y.o. female who presents for annual wellness visit and follow-up on chronic medical conditions.   She has the following concerns:  Seasonal allergies can cause chest congestion and trigger wheezing.   Allergies have been flaring--sinus congestion, drainage, coughing only in the morning. Denies any chest tightness.  Uses albuterol occasionally. She is not taking any allergy medications.  She tried Nasacort in the past, didn't think it was okay to continue to take.  Osteopenia: She started on fosamax in 02/2012. Initial T-score was T-2.4, and high risk FRAX score (hip and elsewhere).  She started having problems with her right jaw, and stopped taking the alendronate in 09/2016. Last year she reported some persistent discomfort, clicking on the right side. No locking.  This has resolved, hasn't noticed any jaw problems lately. Last DEXA was 11/21/2018, with T-2 at R fem neck, -1.3 at L fem neck, -1.6 at total L femur; reportedly stable at hips.  Mild hyperlipidemia: LDL in 2018 was up to 145, higher than prior checks. Recheck in 2019 LDL was down to 134  Last year she reported eating more eggs (at [facility]), 4x/week (doesn't have cheese on it, but sometimes a bit of bacon). Infrequent red meat (burger every 2-3 weeks).  LDL was back up to 145.  Her HDL and TG have been normal, with normal chol/HDL ratio. She is due for recheck. Current diet includes oatmeal for breakfast.  Only rarely has eggs.  She doesn't eat much cheese.  Infrequently eats lean bacon.  Red meat every couple of weeks.  Lab Results  Component  Value Date   CHOL 238 (H) 12/11/2018   HDL 78 12/11/2018   LDLCALC 145 (H) 12/11/2018   TRIG 90 12/11/2018   CHOLHDL 3.1 12/11/2018    Immunization History  Administered Date(s) Administered   Fluad Quad(high Dose 65+) 12/11/2018   Influenza Split 01/04/2012   Influenza, High Dose Seasonal PF 12/15/2013, 02/26/2015, 12/05/2017   PFIZER SARS-COV-2 Vaccination 05/03/2019, 05/28/2019   Pneumococcal Conjugate-13 06/04/2013   Pneumococcal Polysaccharide-23 09/17/2008, 12/11/2018   Td 10/20/2015   Tdap 05/07/2005   Zoster Recombinat (Shingrix) 12/20/2017   Last Pap smear: s/p hysterectomy  Last mammogram:11/2019 Last colonoscopy:08/2008 (diverticulosis) Last year we discussed that she was due for screening, discussed colonoscopy vs Cologuard.  She didn't go last year due to [place], having her family live with her at that time. She has the letter at home, plans to call and schedule. Last DEXA:11/2018 T-2 R fem neck, -1.3 L fem neck, -1.6 total L femur (reportedly stable at hips); prior DEXA 10/2016 T-1.9 R fem neck (improved from -2.3) and T-1.4 L fem neck (improved from -1.6) Dentist: twice yearly for cleanings Ophtho:Every 6 months Exercise: yardwork.  Used to walk on the track at church, but not since vacation (can't remember how long ago, at least a couple of months). No weight-bearing exercise. Has some hand weights at home.  Vitamin D screen: normal 09/2016, level of36  Other doctors caring for patient include: Dermatologist Dr. Rolm Bookbinder Dentist: Dr. Moss Mc Ortho:  Dr. Percell Miller (for her wrist). Retina specialist: Dr. Zigmund Daniel Ophtho:Dr. Katy Fitch GI: Dr. Penelope Coop  Depression screen: Negative  Fall screen:Negative Functional status screen: Notable only for trouble with stairs due to right knee pain from arthritis, leakage of urine with cough/sneeze, only if sick (not from allergies).  Some fecal urgency but no leakage. Occasionally she can't think of a word  or why she walked into a room. Mini-Cog screen: normal (score of 5) See full screens in epic.  End of Life Discussion: Patient hasa living will and medical power of attorney.  We do not have copies.   PMH, PSH, SH and FH were reviewed and updated  Outpatient Encounter Medications as of 12/29/2019  Medication Sig Note   Ascorbic Acid (VITAMIN C) 1000 MG tablet Take 1,000 mg by mouth 2 (two) times daily.     Calcium Carbonate-Vitamin D (RA CALCIUM PLUS VITAMIN D) 600-400 MG-UNIT per tablet Take 1 tablet by mouth daily.     Glucosamine HCl 1000 MG TABS Take 1,000 mg by mouth 2 (two) times daily.    Omega-3 Fatty Acids (FISH OIL) 1200 MG CAPS Take 1 capsule by mouth daily.     Probiotic Product (PROBIOTIC DAILY PO) Take 1 each by mouth daily. 12/29/2019: Gummy with pre-biotic   vitamin B-12 (CYANOCOBALAMIN) 1000 MCG tablet Take 1,000 mcg by mouth daily.    vitamin E 400 UNIT capsule Take 400 Units by mouth daily.    albuterol (VENTOLIN HFA) 108 (90 Base) MCG/ACT inhaler Inhale 1-2 puffs into the lungs every 6 (six) hours as needed for wheezing or shortness of breath. (Patient not taking: Reported on 12/29/2019)    No facility-administered encounter medications on file as of 12/29/2019.   Allergies  Allergen Reactions   Erythromycin Swelling   Triple Antibiotic [Bacitracin-Neomycin-Polymyxin] Swelling    Blistering    ROS: The patient denies anorexia, fever, weight changes, headaches, decreased hearing, ear pain, sore throat, breast concerns, chest pain, palpitations, dizziness, syncope, dyspnea on exertion, swelling, nausea, vomiting, diarrhea, constipation, abdominal pain, melena, hematochezia, incontinence, dysuria, vaginal bleeding, discharge, odor or itch, genital lesions, numbness, tingling, weakness, tremor, suspicious skin lesions, depression, anxiety, abnormal bleeding/bruising, or enlarged lymph nodes.  Occasional heartburn, uses pepcid as needed(rare) Occasional  knee pain with stairs, on the right.  No pain with walking or other activities IBS- occasional bowel urgency; denies incontinence. May be worse aftera heavy meal.  Applesauce, pre/probiotics have been helping with her mild constipation and fecal urgency.   PHYSICAL EXAM:  BP 132/72    Pulse 72    Ht _0  (1.753 m)    Wt 162 lb 12.8 oz (73.8 kg)    BMI 24.04 kg/m   Wt Readings from Last 3 Encounters:  12/29/19 162 lb 12.8 oz (73.8 kg)  12/11/18 162 lb 3.2 oz (73.6 kg)  06/03/18 166 lb 12.8 oz (75.7 kg)    General Appearance:  Alert, cooperative, no distress, appears stated age.   Head:  Normocephalic, without obvious abnormality, atraumatic   Eyes:  PERRL, conjunctiva/corneas clear, EOM's intact, fundibenign  Ears:  NormalTM's and external ear canals.   Nose:  Not examined, wearing mask due to COVID-19 pandemic   Throat:  Not examined, wearing mask due to COVID-19 pandemic   Neck:  Supple, no lymphadenopathy; thyroid: no enlargement/ tenderness/nodules; no carotid bruit or JVD   Back:  Spine nontender, no curvature, ROM normal, no CVA tenderness  Lungs:  Clear to auscultation bilaterally without wheezes, rales or ronchi; respirations unlabored  Chest Wall:  No tenderness or deformity. Prominent manubrium, nontender  Heart:  Regular rate and rhythm, S1 and S2 normal, no murmur, rub  or gallop   Breast Exam:  No tenderness, masses, or nipple discharge or inversion. No axillary lymphadenopathy   Abdomen:  Soft, non-tender, nondistended, normoactive bowel sounds,  no masses, no hepatosplenomegaly   Genitalia:  Normal external genitalia without lesions, mild atrophic changes. BUS and vagina normal; bimanual exam normal--no pelvic masses appreciable, no tenderness. Pap not performed   Rectal:  Normal tone, no masses or tenderness; guaiac negative stool   Extremities:  No clubbingorcyanosis. Bilateral bunions, hammertoes and callouses.2nd toes  crossed/overlapping great toes bilaterally.Onychomycotic, thickened nails. Arthritic deformity to DIP's in handsbilaterally.Unchanged from prior exams  Pulses:  2+ and symmetric all extremities   Skin:  Skin color, texture, turgor normal, no rashes.Scattered cherry hemangiomas, some SK's.  Lymph nodes:  Cervical, supraclavicular, and axillary nodes normal   Neurologic:  Normal strength, sensation and gait; reflexes 2+ and symmetric throughout    Psych: Normal mood, affect, hygiene and grooming   ASSESSMENT/PLAN:  Medicare annual wellness visit, subsequent  Osteopenia, unspecified location - discussed Ca, D and weight-bearing exercise.  recheck DEXA next year  Hyperlipidemia, unspecified hyperlipidemia type - due for recheck.  Low cholesterol diet reviewed - Plan: Comprehensive metabolic panel, Lipid panel  Colon cancer screening - discussed options including Cologuard and colonoscopy.  She will contact GI to schedule (or [name] for cologuard referral if preferred)  Vaccine counseling - declined both flu and COVID booster today.  COVID booster given. [place] 2 weeks for flu shot. Reminded to get 2nd Shingrix from pharmacy  Need for viral immunization - Plan: Pfizer SARS-COV-2 Vaccine  Medication monitoring encounter - Plan: Comprehensive metabolic panel, CBC with Differential/Platelet  Need for hepatitis C screening test - Plan: Hepatitis C antibody   Lipid, c-met, cbc, hepC  Osteopenia--Took alendronate for just under 5 years.  Osteopenia is stable, okay to remain off and recheck DEXA next year (11/2020)  Discussed monthly self breast exams and yearly mammograms; at least 30 minutes of aerobic activity at least 5 days/week, weight-bearing exercise at least 2x/wk; proper sunscreen use reviewed; healthy diet, including goals of calcium and vitamin D intake and alcohol recommendations (less than or equal to 1 drink/day) reviewed; regular seatbelt use; changing  batteries in smoke detectors. Immunization recommendations discussed--high dose flu shots recommended yearly,to return in 2 weeks (declined getting today); COVID booster given today;Recommend getting second dose of Shingrix (delayed due to having gotten shingles in the interim, 02/2018). Colon cancer screening recommendations reviewed,past due--colonoscopy vs Cologuard was discussed. Repeat DEXA 11/2020.  MOST form reviewed and updated/signed. Full code, full care, no prolonged IV fluids/tube feeds. Requested copy of living will and healthcare power of attorney.    Medicare Attestation I have personally reviewed: The patient's medical and social history Their use of alcohol, tobacco or illicit drugs Their current medications and supplements The patient's functional ability including ADLs,fall risks, home safety risks, cognitive, and hearing and visual impairment Diet and physical activities Evidence for depression or mood disorders  The patient's weight, height, BMI have been recorded in the chart.  I have made referrals, counseling, and provided education to the patient based on review of the above and I have provided the patient with a written personalized care plan for preventive services.

## 2019-12-27 NOTE — Patient Instructions (Addendum)
HEALTH MAINTENANCE RECOMMENDATIONS:  It is recommended that you get at least 30 minutes of aerobic exercise at least 5 days/week (for weight loss, you may need as much as 60-90 minutes). This can be any activity that gets your heart rate up. This can be divided in 10-15 minute intervals if needed, but try and build up your endurance at least once a week.  Weight bearing exercise is also recommended twice weekly.  Eat a healthy diet with lots of vegetables, fruits and fiber.  "Colorful" foods have a lot of vitamins (ie green vegetables, tomatoes, red peppers, etc).  Limit sweet tea, regular sodas and alcoholic beverages, all of which has a lot of calories and sugar.  Up to 1 alcoholic drink daily may be beneficial for women (unless trying to lose weight, watch sugars).  Drink a lot of water.  Calcium recommendations are 1200-1500 mg daily (1500 mg for postmenopausal women or women without ovaries), and vitamin D 1000 IU daily.  This should be obtained from diet and/or supplements (vitamins), and calcium should not be taken all at once, but in divided doses.  Monthly self breast exams and yearly mammograms for women over the age of 42 is recommended.  Sunscreen of at least SPF 30 should be used on all sun-exposed parts of the skin when outside between the hours of 10 am and 4 pm (not just when at beach or pool, but even with exercise, golf, tennis, and yard work!)  Use a sunscreen that says "broad spectrum" so it covers both UVA and UVB rays, and make sure to reapply every 1-2 hours.  Remember to change the batteries in your smoke detectors when changing your clock times in the spring and fall. Carbon monoxide detectors are recommended for your home.  Use your seat belt every time you are in a car, and please drive safely and not be distracted with cell phones and texting while driving.   Ms. Scarboro , Thank you for taking time to come for your Medicare Wellness Visit. I appreciate your ongoing  commitment to your health goals. Please review the following plan we discussed and let me know if I can assist you in the future.    This is a list of the screening recommended for you and due dates:  Health Maintenance  Topic Date Due  .  Hepatitis C: One time screening is recommended by Center for Disease Control  (CDC) for  adults born from [birth year] through 1964.   Never done  . Flu Shot  10/19/2019  . Mammogram  12/09/2020  . Tetanus Vaccine  10/19/2025  . DEXA scan (bone density measurement)  Completed  . COVID-19 Vaccine  Completed  . Pneumonia vaccines  Completed   Guidelines changed and they now recommend hepatitis C screening for all adults. This was done with your bloodwork today.  We will repeat bone density test next year (2 year follow-up).  COVID booster was given today. Return in 2 weeks for a flu shot.  Colon cancer screening is due. We discussed colonoscopy vs Cologuard.  You reported still having the letter at home to remind you to call (if you decide on Cologuard, we can do that referral for you, without contacting GI).  Remember to get your 2nd shingles vaccine at some point (2-4 weeks apart from any other vaccine).  Please get [name] a copy of your living will and healthcare power of attorney at your convenience, so it can be scanned into your medical chart.  I  recommend using a nasal steroid spray such as Flonase or Nasacort.  Use it daily through the allergy season (1-2 months). You may overlap this with an antihistamine such as claritin or allegra or zyrtec (or Chlor-trimeton) for about a week until the nasal spray is effective. Use gentle sniffs of the nasal spray only.  Get back into your walking routine (indoors or outside). Use your handweights at least twice a week (you can walk with them)

## 2019-12-29 ENCOUNTER — Other Ambulatory Visit: Payer: Self-pay

## 2019-12-29 ENCOUNTER — Ambulatory Visit (INDEPENDENT_AMBULATORY_CARE_PROVIDER_SITE_OTHER): Payer: Medicare Other | Admitting: Family Medicine

## 2019-12-29 ENCOUNTER — Encounter: Payer: Self-pay | Admitting: Family Medicine

## 2019-12-29 VITALS — BP 132/72 | HR 72 | Ht 69.0 in | Wt 162.8 lb

## 2019-12-29 DIAGNOSIS — M8580 Other specified disorders of bone density and structure, unspecified site: Secondary | ICD-10-CM | POA: Diagnosis not present

## 2019-12-29 DIAGNOSIS — Z23 Encounter for immunization: Secondary | ICD-10-CM | POA: Diagnosis not present

## 2019-12-29 DIAGNOSIS — Z5181 Encounter for therapeutic drug level monitoring: Secondary | ICD-10-CM

## 2019-12-29 DIAGNOSIS — E785 Hyperlipidemia, unspecified: Secondary | ICD-10-CM | POA: Diagnosis not present

## 2019-12-29 DIAGNOSIS — Z1211 Encounter for screening for malignant neoplasm of colon: Secondary | ICD-10-CM

## 2019-12-29 DIAGNOSIS — Z1159 Encounter for screening for other viral diseases: Secondary | ICD-10-CM

## 2019-12-29 DIAGNOSIS — Z7185 Encounter for immunization safety counseling: Secondary | ICD-10-CM

## 2019-12-29 DIAGNOSIS — Z0000 Encounter for general adult medical examination without abnormal findings: Secondary | ICD-10-CM

## 2019-12-30 LAB — LIPID PANEL
Chol/HDL Ratio: 3.3 ratio (ref 0.0–4.4)
Cholesterol, Total: 248 mg/dL — ABNORMAL HIGH (ref 100–199)
HDL: 76 mg/dL (ref >39)
LDL Chol Calc (NIH): 154 mg/dL — ABNORMAL HIGH (ref 0–99)
Triglycerides: 102 mg/dL (ref 0–149)
VLDL Cholesterol Cal: 18 mg/dL (ref 5–40)

## 2019-12-30 LAB — COMPREHENSIVE METABOLIC PANEL
ALT: 8 IU/L (ref 0–32)
AST: 12 IU/L (ref 0–40)
Albumin/Globulin Ratio: 1.7 (ref 1.2–2.2)
Albumin: 4.6 g/dL (ref 3.7–4.7)
Alkaline Phosphatase: 60 IU/L (ref 44–121)
BUN/Creatinine Ratio: 13 (ref 12–28)
BUN: 10 mg/dL (ref 8–27)
Bilirubin Total: 0.5 mg/dL (ref 0.0–1.2)
CO2: 25 mmol/L (ref 20–29)
Calcium: 9.6 mg/dL (ref 8.7–10.3)
Chloride: 105 mmol/L (ref 96–106)
Creatinine, Ser: 0.75 mg/dL (ref 0.57–1.00)
GFR calc Af Amer: 88 mL/min/{1.73_m2} (ref >59)
GFR calc non Af Amer: 77 mL/min/{1.73_m2} (ref >59)
Globulin, Total: 2.7 g/dL (ref 1.5–4.5)
Glucose: 91 mg/dL (ref 65–99)
Potassium: 4.3 mmol/L (ref 3.5–5.2)
Sodium: 143 mmol/L (ref 134–144)
Total Protein: 7.3 g/dL (ref 6.0–8.5)

## 2019-12-30 LAB — CBC WITH DIFFERENTIAL/PLATELET
Basophils Absolute: 0.1 10*3/uL (ref 0.0–0.2)
Basos: 1 %
EOS (ABSOLUTE): 0.2 10*3/uL (ref 0.0–0.4)
Eos: 3 %
Hematocrit: 41.4 % (ref 34.0–46.6)
Hemoglobin: 13.5 g/dL (ref 11.1–15.9)
Immature Grans (Abs): 0 10*3/uL (ref 0.0–0.1)
Immature Granulocytes: 0 %
Lymphocytes Absolute: 2.3 10*3/uL (ref 0.7–3.1)
Lymphs: 32 %
MCH: 29.8 pg (ref 26.6–33.0)
MCHC: 32.6 g/dL (ref 31.5–35.7)
MCV: 91 fL (ref 79–97)
Monocytes Absolute: 0.7 10*3/uL (ref 0.1–0.9)
Monocytes: 10 %
Neutrophils Absolute: 3.9 10*3/uL (ref 1.4–7.0)
Neutrophils: 54 %
Platelets: 277 10*3/uL (ref 150–450)
RBC: 4.53 x10E6/uL (ref 3.77–5.28)
RDW: 12 % (ref 11.7–15.4)
WBC: 7.2 10*3/uL (ref 3.4–10.8)

## 2019-12-30 LAB — HEPATITIS C ANTIBODY: Hep C Virus Ab: <0.1 s/co ratio (ref 0.0–0.9)

## 2020-01-14 ENCOUNTER — Other Ambulatory Visit: Payer: Self-pay

## 2020-01-14 ENCOUNTER — Other Ambulatory Visit (INDEPENDENT_AMBULATORY_CARE_PROVIDER_SITE_OTHER): Payer: Medicare Other

## 2020-01-14 DIAGNOSIS — Z23 Encounter for immunization: Secondary | ICD-10-CM | POA: Diagnosis not present

## 2020-01-26 DIAGNOSIS — H35372 Puckering of macula, left eye: Secondary | ICD-10-CM | POA: Diagnosis not present

## 2020-01-26 DIAGNOSIS — H401122 Primary open-angle glaucoma, left eye, moderate stage: Secondary | ICD-10-CM | POA: Diagnosis not present

## 2020-01-26 DIAGNOSIS — H43813 Vitreous degeneration, bilateral: Secondary | ICD-10-CM | POA: Diagnosis not present

## 2020-01-26 DIAGNOSIS — H04123 Dry eye syndrome of bilateral lacrimal glands: Secondary | ICD-10-CM | POA: Diagnosis not present

## 2020-01-26 DIAGNOSIS — Z961 Presence of intraocular lens: Secondary | ICD-10-CM | POA: Diagnosis not present

## 2020-01-26 DIAGNOSIS — H2511 Age-related nuclear cataract, right eye: Secondary | ICD-10-CM | POA: Diagnosis not present

## 2020-02-18 ENCOUNTER — Other Ambulatory Visit: Payer: Self-pay

## 2020-02-18 ENCOUNTER — Encounter (INDEPENDENT_AMBULATORY_CARE_PROVIDER_SITE_OTHER): Payer: Medicare Other | Admitting: Ophthalmology

## 2020-02-18 DIAGNOSIS — H59032 Cystoid macular edema following cataract surgery, left eye: Secondary | ICD-10-CM

## 2020-02-18 DIAGNOSIS — H33301 Unspecified retinal break, right eye: Secondary | ICD-10-CM

## 2020-02-18 DIAGNOSIS — H338 Other retinal detachments: Secondary | ICD-10-CM

## 2020-02-18 DIAGNOSIS — H35372 Puckering of macula, left eye: Secondary | ICD-10-CM

## 2020-02-18 DIAGNOSIS — H43813 Vitreous degeneration, bilateral: Secondary | ICD-10-CM | POA: Diagnosis not present

## 2020-02-23 DIAGNOSIS — Z961 Presence of intraocular lens: Secondary | ICD-10-CM | POA: Diagnosis not present

## 2020-02-23 DIAGNOSIS — H04123 Dry eye syndrome of bilateral lacrimal glands: Secondary | ICD-10-CM | POA: Diagnosis not present

## 2020-02-23 DIAGNOSIS — H10022 Other mucopurulent conjunctivitis, left eye: Secondary | ICD-10-CM | POA: Diagnosis not present

## 2020-02-23 DIAGNOSIS — H2511 Age-related nuclear cataract, right eye: Secondary | ICD-10-CM | POA: Diagnosis not present

## 2020-02-25 DIAGNOSIS — H10022 Other mucopurulent conjunctivitis, left eye: Secondary | ICD-10-CM | POA: Diagnosis not present

## 2020-03-03 DIAGNOSIS — H10022 Other mucopurulent conjunctivitis, left eye: Secondary | ICD-10-CM | POA: Diagnosis not present

## 2020-05-26 DIAGNOSIS — H401122 Primary open-angle glaucoma, left eye, moderate stage: Secondary | ICD-10-CM | POA: Diagnosis not present

## 2020-05-26 DIAGNOSIS — H04123 Dry eye syndrome of bilateral lacrimal glands: Secondary | ICD-10-CM | POA: Diagnosis not present

## 2020-05-26 DIAGNOSIS — H43813 Vitreous degeneration, bilateral: Secondary | ICD-10-CM | POA: Diagnosis not present

## 2020-05-26 DIAGNOSIS — H35372 Puckering of macula, left eye: Secondary | ICD-10-CM | POA: Diagnosis not present

## 2020-05-26 DIAGNOSIS — Z961 Presence of intraocular lens: Secondary | ICD-10-CM | POA: Diagnosis not present

## 2020-05-26 DIAGNOSIS — H2511 Age-related nuclear cataract, right eye: Secondary | ICD-10-CM | POA: Diagnosis not present

## 2020-08-18 ENCOUNTER — Encounter (INDEPENDENT_AMBULATORY_CARE_PROVIDER_SITE_OTHER): Payer: Medicare Other | Admitting: Ophthalmology

## 2020-08-18 ENCOUNTER — Other Ambulatory Visit: Payer: Self-pay

## 2020-08-18 DIAGNOSIS — H35372 Puckering of macula, left eye: Secondary | ICD-10-CM

## 2020-08-18 DIAGNOSIS — H338 Other retinal detachments: Secondary | ICD-10-CM | POA: Diagnosis not present

## 2020-08-18 DIAGNOSIS — H33301 Unspecified retinal break, right eye: Secondary | ICD-10-CM | POA: Diagnosis not present

## 2020-08-18 DIAGNOSIS — H43813 Vitreous degeneration, bilateral: Secondary | ICD-10-CM

## 2020-09-21 ENCOUNTER — Other Ambulatory Visit: Payer: Self-pay

## 2020-09-21 ENCOUNTER — Ambulatory Visit
Admission: EM | Admit: 2020-09-21 | Discharge: 2020-09-21 | Disposition: A | Discharge status: Home / Self Care | Payer: Medicare Other | Attending: Emergency Medicine | Admitting: Emergency Medicine

## 2020-09-21 DIAGNOSIS — J069 Acute upper respiratory infection, unspecified: Secondary | ICD-10-CM | POA: Diagnosis not present

## 2020-09-21 MED ORDER — BENZONATATE 200 MG PO CAPS: 200.0000 mg | ORAL_CAPSULE | Freq: Three times a day (TID) | ORAL | 0 refills | Status: AC | PRN

## 2020-09-21 MED ORDER — DM-GUAIFENESIN ER 30-600 MG PO TB12: 1.0000 | ORAL_TABLET | Freq: Two times a day (BID) | ORAL | 0 refills | Status: DC

## 2020-09-21 MED ORDER — GUAIFENESIN-CODEINE 100-10 MG/5ML PO SOLN: 5.0000 mL | Freq: Every evening | ORAL | 0 refills | Status: DC | PRN

## 2020-09-21 MED ORDER — ALBUTEROL SULFATE HFA 108 (90 BASE) MCG/ACT IN AERS: 1.0000 | INHALATION_SPRAY | Freq: Four times a day (QID) | RESPIRATORY_TRACT | 0 refills | Status: DC | PRN

## 2020-09-21 NOTE — Discharge Instructions (Addendum)
COVID test pending, monitor MyChart for results Tessalon every 8 hours for daytime cough Use with Mucinex DM twice daily for further relief of cough and congestion Robitussin with codeine at bedtime-will cause drowsiness, do not drive or work after taking Continue Chlortab Albuterol inhaler as needed for shortness of breath chest tightness and wheezing Please follow-up if not improving with the above medicines over the next 3 to 5 days

## 2020-09-21 NOTE — ED Provider Notes (Signed)
[facility]    CSN: [id number] Arrival date & time: 09/21/20  0907      History   Chief Complaint Chief Complaint  Patient presents with   Cough   Generalized Body Aches    HPI Joan Morales is a 80 y.o. female presenting today for evaluation of cough and body aches.  Reports that she began to develop cough and congestion approximately 5 to 6 days ago.  Symptoms have slightly moved into the chest.  Reports that she is prone to developing bronchitis.  She denies any fevers.  Denies chest pain or shortness of breath.  Denies any notable wheezing recently.  Does report worsening nighttime cough.  Reports husband with similar symptoms, but no known COVID exposure.  Declines prior COVID testing since onset of symptoms.  Denies nausea, vomiting, diarrhea.  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 03/09/2014   Osteopenia 01/04/2012    Past Surgical History:  Procedure Laterality Date   CATARACT EXTRACTION Left 11/2015   EYE SURGERY     FRACTURE SURGERY     GAS INSERTION Left 03/10/2014   Procedure: INSERTION OF GAS-C3F8;  Surgeon: Hayden Pedro, MD;  Location: [place];  Service: Ophthalmology;  Laterality: Left;   LASER PHOTO ABLATION Left 03/10/2014   Procedure: LASER PHOTO ABLATION-HEADSCOPE LASER;  Surgeon: Hayden Pedro, MD;  Location: [place];  Service: Ophthalmology;  Laterality: Left;   SCLERAL BUCKLE Left 03/10/2014   SCLERAL BUCKLE WITH CRYO Left 03/10/2014   Procedure: SCLERAL BUCKLE  WITH CRYO AND DIATHERMY;  Surgeon: Hayden Pedro, MD;  Location: [place];  Service: Ophthalmology;  Laterality: Left;   TONSILLECTOMY     TOTAL ABDOMINAL HYSTERECTOMY  02/2005   w/BSO; L ovarian teratoma   TOTAL ABDOMINAL HYSTERECTOMY W/ BILATERAL SALPINGOOPHORECTOMY  12/06   L ovarian teratoma   WRIST FRACTURE SURGERY Right 02/26/14   plate and screws    OB History     Gravida  1   Para  1   Term      Preterm      AB      Living  1      SAB      IAB      Ectopic      Multiple      Live Births               Home Medications    Prior to Admission medications   Medication Sig Start Date End Date Taking? Authorizing Provider  albuterol (VENTOLIN HFA) 108 (90 Base) MCG/ACT inhaler Inhale 1-2 puffs into the lungs every 6 (six) hours as needed for wheezing or shortness of breath. 09/21/20  Yes Danyale Ridinger C, PA-C  benzonatate (TESSALON) 200 MG capsule Take 1 capsule (200 mg total) by mouth 3 (three) times daily as needed for up to 7 days for cough. 09/21/20 09/28/20 Yes Shabana Armentrout C, PA-C  dextromethorphan-guaiFENesin (MUCINEX DM) 30-600 MG  12hr tablet Take 1 tablet by mouth 2 (two) times daily. 09/21/20  Yes Aasia Peavler C, PA-C  guaiFENesin-codeine 100-10 MG/5ML syrup Take 5-10 mLs by mouth at bedtime as needed for cough. 09/21/20  Yes Porcha Deblanc C, PA-C  Ascorbic Acid (VITAMIN C) 1000 MG tablet Take 1,000 mg by mouth 2 (two) times daily.     [provider]  Calcium Carbonate-Vitamin D (RA CALCIUM PLUS VITAMIN D) 600-400 MG-UNIT per tablet Take 1 tablet by mouth daily.     [provider]  Glucosamine HCl 1000 MG TABS Take 1,000 mg by mouth 2 (two) times daily.    [provider]  Omega-3 Fatty Acids (FISH OIL) 1200 MG CAPS Take 1 capsule by mouth daily.     [provider]  vitamin B-12 (CYANOCOBALAMIN) 1000 MCG tablet Take 1,000 mcg by mouth daily.    [provider]  vitamin E 400 UNIT capsule Take 400 Units  by mouth daily.    [provider]    Family History Family History  Problem Relation Age of Onset   Heart disease Mother    COPD Father        smoker   Fibromyalgia Sister    Irritable bowel syndrome Sister    Breast cancer Maternal Grandmother        50's   Diabetes Paternal Aunt    Colon cancer Paternal Aunt 96   Cancer Paternal Aunt 96       colon   Asthma Paternal Grandmother     Social History Social History   Tobacco Use   Smoking status: Never   Smokeless tobacco: Never  Vaping Use   Vaping Use: Never used  Substance Use Topics   Alcohol use: No   Drug use: No     Allergies   Erythromycin and Triple antibiotic [bacitracin-neomycin-polymyxin]   Review of Systems Review of Systems  Constitutional:  Negative for activity change, appetite change, chills, fatigue and fever.  HENT:  Positive for congestion. Negative for ear pain, rhinorrhea, sinus pressure, sore throat and trouble swallowing.   Eyes:  Negative for discharge and redness.  Respiratory:  Positive for cough. Negative for chest tightness and shortness of breath.   Cardiovascular:  Negative for chest pain.  Gastrointestinal:  Negative for abdominal pain, diarrhea, nausea and vomiting.  Musculoskeletal:  Positive for myalgias.  Skin:  Negative for rash.  Neurological:  Negative for dizziness, light-headedness and headaches.    Physical Exam Triage Vital Signs ED Triage Vitals  Enc Vitals Group     BP      Pulse      Resp      Temp      Temp src      SpO2      Weight      Height      Head Circumference      Peak Flow      Pain Score      Pain Loc      Pain Edu?      Excl. in [place]?    No data found.  Updated Vital Signs BP 127/64 (BP Location: Left Arm)   Pulse 90   Temp 98.4 F (36.9 C) (Oral)   Resp 18   SpO2 95%   Visual Acuity Right Eye Distance:   Left Eye Distance:   Bilateral Distance:    Right Eye Near:   Left Eye Near:    Bilateral Near:     Physical  Exam  Vitals and nursing note reviewed.  Constitutional:      Appearance: She is well-developed.     Comments: No acute distress  HENT:     Head: Normocephalic and atraumatic.     Ears:     Comments: Bilateral ears without tenderness to palpation of external auricle, tragus and mastoid, EAC's without erythema or swelling, TM's with good bony landmarks and cone of light. Non erythematous.      Nose: Nose normal.     Mouth/Throat:     Comments: Oral mucosa pink and moist, no tonsillar enlargement or exudate. Posterior pharynx patent and nonerythematous, no uvula deviation or swelling. Normal phonation.  Eyes:     Conjunctiva/sclera: Conjunctivae normal.  Cardiovascular:     Rate and Rhythm: Normal rate.  Pulmonary:     Effort: Pulmonary effort is normal. No respiratory distress.     Comments: Breathing comfortably at rest, CTABL, no wheezing, rales or other adventitious sounds auscultated  Abdominal:     General: There is no distension.  Musculoskeletal:        General: Normal range of motion.     Cervical back: Neck supple.  Skin:    General: Skin is warm and dry.  Neurological:     Mental Status: She is alert and oriented to person, place, and time.     UC Treatments / Results  Labs (all labs ordered are listed, but only abnormal results are displayed) Labs Reviewed  NOVEL CORONAVIRUS, NAA    EKG   Radiology No results found.  Procedures Procedures (including critical care time)  Medications Ordered in UC Medications - No data to display  Initial Impression / Assessment and Plan / UC Course  I have reviewed the triage vital signs and the nursing notes.  Pertinent labs & imaging results that were available during my care of the patient were reviewed by me and considered in my medical decision making (see chart for details).     Viral URI with cough-COVID test pending for screening, symptoms x5 to 6 days, lungs clear to auscultation with stable vital signs,  recommending continued symptomatic and supportive care, recommending Tessalon and Mucinex during the day, Robitussin with codeine at bedtime, provided albuterol inhaler to use as needed for any shortness of breath chest tightness or wheezing, low suspicion of pneumonia or bronchitis at this time.  Advised to continue to monitor symptoms this week, follow-up if not improving or worsening.  Discussed strict return precautions. Patient verbalized understanding and is agreeable with plan.  Final Clinical Impressions(s) / UC Diagnoses   Final diagnoses:  Viral URI with cough     Discharge Instructions      COVID test pending, monitor MyChart for results Tessalon every 8 hours for daytime cough Use with Mucinex DM twice daily for further relief of cough and congestion Robitussin with codeine at bedtime-will cause drowsiness, do not drive or work after taking Continue Chlortab Albuterol inhaler as needed for shortness of breath chest tightness and wheezing Please follow-up if not improving with the above medicines over the next 3 to 5 days     ED Prescriptions     Medication Sig Dispense Auth. Provider   albuterol (VENTOLIN HFA) 108 (90 Base) MCG/ACT inhaler Inhale 1-2 puffs into the lungs every 6 (six) hours as needed for wheezing or shortness of breath. 1 each Jamillia Closson C, PA-C   benzonatate (TESSALON) 200 MG capsule Take 1 capsule (200 mg total) by mouth 3 (three) times daily as needed for up  to 7 days for cough. 28 capsule Dennie Moltz C, PA-C   dextromethorphan-guaiFENesin (MUCINEX DM) 30-600 MG 12hr tablet Take 1 tablet by mouth 2 (two) times daily. 16 tablet Sesar Madewell C, PA-C   guaiFENesin-codeine 100-10 MG/5ML syrup Take 5-10 mLs by mouth at bedtime as needed for cough. 120 mL Tyrihanna Wingert, [place] C, PA-C      PDMP not reviewed this encounter.   Janith Lima, PA-C 09/21/20 1009

## 2020-09-21 NOTE — ED Triage Notes (Signed)
Patient presents to Urgent Care with complaints of dry cough since last Thursday. Treating with OTC cold/cough meds.    Denies fever.

## 2020-09-23 LAB — NOVEL CORONAVIRUS, NAA: SARS-CoV-2, NAA: NOT DETECTED

## 2020-09-23 LAB — SARS-COV-2, NAA 2 DAY TAT

## 2020-09-26 ENCOUNTER — Ambulatory Visit
Admission: EM | Admit: 2020-09-26 | Discharge: 2020-09-26 | Disposition: A | Discharge status: Home / Self Care | Payer: Medicare Other | Attending: Student | Admitting: Student

## 2020-09-26 ENCOUNTER — Encounter: Payer: Self-pay | Admitting: Emergency Medicine

## 2020-09-26 ENCOUNTER — Other Ambulatory Visit: Payer: Self-pay

## 2020-09-26 DIAGNOSIS — J209 Acute bronchitis, unspecified: Secondary | ICD-10-CM

## 2020-09-26 MED ORDER — PREDNISONE 10 MG (21) PO TBPK: ORAL_TABLET | Freq: Every day | ORAL | 0 refills | Status: DC

## 2020-09-26 NOTE — Discharge Instructions (Addendum)
-  Prednisone taper for cough/bronchitis. I recommend taking this in the morning as it could give you energy.  Avoid NSAIDs like ibuprofen and alleve while taking this medication as they can increase your risk of stomach upset and even GI bleeding when in combination with a steroid. You can continue tylenol (acetaminophen) up to 1000mg  3x daily. -Continue the other medications- albuterol up to 4x daily, Nasacort 1x daily, prescription cough syrup at night -Come back and see [name] if your symptoms worsen/persist despite treatment, like shortness of breath, weakness, dizziness, chest pain

## 2020-09-26 NOTE — ED Provider Notes (Signed)
[facility]    CSN: [id number] Arrival date & time: 09/26/20  0855      History   Chief Complaint Chief Complaint  Patient presents with   Cough    HPI Joan Morales is a 80 y.o. female presenting for cough. Medical history osteopenia. Was last seen at our urgent care for these symptoms 09/21/20,  COVID negative, was prescribed tessalon, mucinex, codeine-guaifenesin, albuterol. States the cough is only getting worse- hacking cough productive of clear and yellow sputum. Shortness of breat during coughing episodes. Feeling well otherwise- Denies fevers/chills, n/v/d, chest pain, dizziness, weakness, congestion, facial pain, teeth pain, headaches, sore throat, loss of taste/smell, swollen lymph nodes, ear pain. Denies history pulmonary disease, though she has had bronchitis before.  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 03/09/2014   Osteopenia 01/04/2012    Past Surgical History:  Procedure Laterality Date   CATARACT EXTRACTION Left 11/2015   EYE SURGERY     FRACTURE SURGERY     GAS INSERTION Left 03/10/2014   Procedure: INSERTION OF GAS-C3F8;  Surgeon: Hayden Pedro, MD;  Location: [place];  Service: Ophthalmology;  Laterality: Left;   LASER PHOTO ABLATION Left 03/10/2014   Procedure: LASER PHOTO ABLATION-HEADSCOPE LASER;  Surgeon: Hayden Pedro, MD;  Location: [place];  Service: Ophthalmology;  Laterality: Left;   SCLERAL BUCKLE Left 03/10/2014   SCLERAL BUCKLE WITH CRYO Left 03/10/2014    Procedure: SCLERAL BUCKLE WITH CRYO AND DIATHERMY;  Surgeon: Hayden Pedro, MD;  Location: [place];  Service: Ophthalmology;  Laterality: Left;   TONSILLECTOMY     TOTAL ABDOMINAL HYSTERECTOMY  02/2005   w/BSO; L ovarian teratoma   TOTAL ABDOMINAL HYSTERECTOMY W/ BILATERAL SALPINGOOPHORECTOMY  12/06   L ovarian teratoma   WRIST FRACTURE SURGERY Right 02/26/14   plate and screws    OB History     Gravida  1   Para  1   Term      Preterm      AB      Living  1      SAB      IAB      Ectopic      Multiple      Live Births               Home Medications    Prior to Admission medications   Medication Sig Start Date End Date Taking? Authorizing Provider  predniSONE (STERAPRED UNI-PAK 21 TAB) 10 MG (21) TBPK tablet Take by mouth daily. Take 6 tabs by mouth daily  for 2 days, then 5 tabs for 2 days, then 4 tabs for 2 days, then 3 tabs for 2 days, 2 tabs for 2 days, then 1 tab by mouth daily for 2 days 09/26/20  Yes Phillip Heal, Sherlon Handing, PA-C  albuterol (VENTOLIN HFA) 108 (90 Base) MCG/ACT inhaler Inhale 1-2 puffs into the lungs every 6 (six) hours as needed for wheezing or  shortness of breath. 09/21/20   Wieters, Hallie C, PA-C  Ascorbic Acid (VITAMIN C) 1000 MG tablet Take 1,000 mg by mouth 2 (two) times daily.     [provider]  benzonatate (TESSALON) 200 MG capsule Take 1 capsule (200 mg total) by mouth 3 (three) times daily as needed for up to 7 days for cough. 09/21/20 09/28/20  Wieters, Hallie C, PA-C  Calcium Carbonate-Vitamin D (RA CALCIUM PLUS VITAMIN D) 600-400 MG-UNIT per tablet Take 1 tablet by mouth daily.     [provider]  dextromethorphan-guaiFENesin (MUCINEX DM) 30-600 MG 12hr tablet Take 1 tablet by mouth 2 (two) times daily. 09/21/20   Wieters, Hallie C, PA-C  Glucosamine HCl 1000 MG TABS Take 1,000 mg by mouth 2 (two) times daily.    [provider]  guaiFENesin-codeine 100-10 MG/5ML syrup Take 5-10 mLs by mouth at bedtime as needed  for cough. 09/21/20   Wieters, Hallie C, PA-C  Omega-3 Fatty Acids (FISH OIL) 1200 MG CAPS Take 1 capsule by mouth daily.     [provider]  vitamin B-12 (CYANOCOBALAMIN) 1000 MCG tablet Take 1,000 mcg by mouth daily.    [provider]  vitamin E 400 UNIT capsule Take 400 Units by mouth daily.    [provider]    Family History Family History  Problem Relation Age of Onset   Heart disease Mother    COPD Father        smoker   Fibromyalgia Sister    Irritable bowel syndrome Sister    Breast cancer Maternal Grandmother        50's   Diabetes Paternal Aunt    Colon cancer Paternal Aunt 96   Cancer Paternal Aunt 96       colon   Asthma Paternal Grandmother     Social History Social History   Tobacco Use   Smoking status: Never   Smokeless tobacco: Never  Vaping Use   Vaping Use: Never used  Substance Use Topics   Alcohol use: No   Drug use: No     Allergies   Erythromycin and Triple antibiotic [bacitracin-neomycin-polymyxin]   Review of Systems Review of Systems  Constitutional:  Negative for appetite change, chills and fever.  HENT:  Negative for congestion, ear pain, rhinorrhea, sinus pressure, sinus pain and sore throat.   Eyes:  Negative for redness and visual disturbance.  Respiratory:  Positive for cough. Negative for chest tightness, shortness of breath and wheezing.   Cardiovascular:  Negative for chest pain and palpitations.  Gastrointestinal:  Negative for abdominal pain, constipation, diarrhea, nausea and vomiting.  Genitourinary:  Negative for dysuria, frequency and urgency.  Musculoskeletal:  Negative for myalgias.  Neurological:  Negative for dizziness, weakness and headaches.  Psychiatric/Behavioral:  Negative for confusion.   All other systems reviewed and are negative.   Physical Exam Triage Vital Signs ED Triage Vitals  Enc Vitals Group     BP 09/26/20 1137 (!) 154/81     Pulse Rate 09/26/20 1137 94     Resp  09/26/20 1137 18     Temp 09/26/20 1137 98.4 F (36.9 C)     Temp Source 09/26/20 1137 Oral     SpO2 09/26/20 1137 94 %     Weight --      Height --      Head Circumference --      Peak Flow --      Pain Score 09/26/20 1138 0  Pain Loc --      Pain Edu? --      Excl. in [place]? --    No data found.  Updated Vital Signs BP (!) 154/81 (BP Location: Left Arm)   Pulse 94   Temp 98.4 F (36.9 C) (Oral)   Resp 18   SpO2 94%   Visual Acuity Right Eye Distance:   Left Eye Distance:   Bilateral Distance:    Right Eye Near:   Left Eye Near:    Bilateral Near:     Physical Exam Vitals reviewed.  Constitutional:      General: She is not in acute distress.    Appearance: Normal appearance. She is not ill-appearing.  HENT:     Head: Normocephalic and atraumatic.     Right Ear: Hearing, tympanic membrane, ear canal and external ear normal. No swelling or tenderness. There is no impacted cerumen. No mastoid tenderness. Tympanic membrane is not perforated, erythematous, retracted or bulging.     Left Ear: Hearing, tympanic membrane, ear canal and external ear normal. No swelling or tenderness. There is no impacted cerumen. No mastoid tenderness. Tympanic membrane is not perforated, erythematous, retracted or bulging.     Nose:     Right Sinus: No maxillary sinus tenderness or frontal sinus tenderness.     Left Sinus: No maxillary sinus tenderness or frontal sinus tenderness.     Mouth/Throat:     Mouth: Mucous membranes are moist.     Pharynx: Uvula midline. No oropharyngeal exudate or posterior oropharyngeal erythema.     Tonsils: No tonsillar exudate.  Cardiovascular:     Rate and Rhythm: Normal rate and regular rhythm.     Heart sounds: Normal heart sounds.  Pulmonary:     Breath sounds: Normal breath sounds and air entry. No wheezing, rhonchi or rales.     Comments: Frequent hacking cough Chest:     Chest wall: No tenderness.  Abdominal:     General: Abdomen is flat. Bowel  sounds are normal.     Tenderness: There is no abdominal tenderness. There is no guarding or rebound.  Lymphadenopathy:     Cervical: No cervical adenopathy.  Neurological:     General: No focal deficit present.     Mental Status: She is alert and oriented to person, place, and time.  Psychiatric:        Attention and Perception: Attention and perception normal.        Mood and Affect: Mood and affect normal.        Behavior: Behavior normal. Behavior is cooperative.        Thought Content: Thought content normal.        Judgment: Judgment normal.     UC Treatments / Results  Labs (all labs ordered are listed, but only abnormal results are displayed) Labs Reviewed - No data to display  EKG   Radiology No results found.  Procedures Procedures (including critical care time)  Medications Ordered in UC Medications - No data to display  Initial Impression / Assessment and Plan / UC Course  I have reviewed the triage vital signs and the nursing notes.  Pertinent labs & imaging results that were available during my care of the patient were reviewed by me and considered in my medical decision making (see chart for details).     This patient is a very pleasant 80 y.o. year old female presenting with acute bronchitis following viral URI.  Today this pt is afebrile nontachycardic nontachypneic,  oxygenating well on room air, no wheezes rhonchi or rales. Was last seen at our urgent care for these symptoms 09/21/20,  COVID negative, was prescribed tessalon, mucinex, codeine-guaifenesin, albuterol. Continue these.  She is not a diabetic. Today sent Prednisone taper sent as below.   ED return precautions discussed. Patient verbalizes understanding and agreement.   Coding this visit a Level 4 for acute illness with systemic symptoms (bronchitis) and prescription drug management.  Final Clinical Impressions(s) / UC Diagnoses   Final diagnoses:  Acute bronchitis, unspecified organism      Discharge Instructions      -Prednisone taper for cough/bronchitis. I recommend taking this in the morning as it could give you energy.  Avoid NSAIDs like ibuprofen and alleve while taking this medication as they can increase your risk of stomach upset and even GI bleeding when in combination with a steroid. You can continue tylenol (acetaminophen) up to 1000mg  3x daily. -Continue the other medications- albuterol up to 4x daily, Nasacort 1x daily, prescription cough syrup at night -Come back and see [name] if your symptoms worsen/persist despite treatment, like shortness of breath, weakness, dizziness, chest pain     ED Prescriptions     Medication Sig Dispense Auth. Provider   predniSONE (STERAPRED UNI-PAK 21 TAB) 10 MG (21) TBPK tablet Take by mouth daily. Take 6 tabs by mouth daily  for 2 days, then 5 tabs for 2 days, then 4 tabs for 2 days, then 3 tabs for 2 days, 2 tabs for 2 days, then 1 tab by mouth daily for 2 days 42 tablet Hazel Sams, PA-C      PDMP not reviewed this encounter.   Hazel Sams, PA-C 09/26/20 1158

## 2020-09-26 NOTE — ED Triage Notes (Signed)
Pt sts continued cough and increased mucous since being seen here on 7/5; pt sts taking meds without improving

## 2020-11-01 DIAGNOSIS — H43813 Vitreous degeneration, bilateral: Secondary | ICD-10-CM | POA: Diagnosis not present

## 2020-11-01 DIAGNOSIS — H401122 Primary open-angle glaucoma, left eye, moderate stage: Secondary | ICD-10-CM | POA: Diagnosis not present

## 2020-11-01 DIAGNOSIS — Z961 Presence of intraocular lens: Secondary | ICD-10-CM | POA: Diagnosis not present

## 2020-11-01 DIAGNOSIS — H04123 Dry eye syndrome of bilateral lacrimal glands: Secondary | ICD-10-CM | POA: Diagnosis not present

## 2020-11-01 DIAGNOSIS — H2511 Age-related nuclear cataract, right eye: Secondary | ICD-10-CM | POA: Diagnosis not present

## 2020-11-01 DIAGNOSIS — H35372 Puckering of macula, left eye: Secondary | ICD-10-CM | POA: Diagnosis not present

## 2020-11-08 DIAGNOSIS — L237 Allergic contact dermatitis due to plants, except food: Secondary | ICD-10-CM | POA: Diagnosis not present

## 2020-11-08 DIAGNOSIS — L820 Inflamed seborrheic keratosis: Secondary | ICD-10-CM | POA: Diagnosis not present

## 2020-11-08 DIAGNOSIS — D692 Other nonthrombocytopenic purpura: Secondary | ICD-10-CM | POA: Diagnosis not present

## 2020-11-08 DIAGNOSIS — D485 Neoplasm of uncertain behavior of skin: Secondary | ICD-10-CM | POA: Diagnosis not present

## 2020-11-08 DIAGNOSIS — D3611 Benign neoplasm of peripheral nerves and autonomic nervous system of face, head, and neck: Secondary | ICD-10-CM | POA: Diagnosis not present

## 2020-11-08 DIAGNOSIS — B353 Tinea pedis: Secondary | ICD-10-CM | POA: Diagnosis not present

## 2020-11-08 DIAGNOSIS — L821 Other seborrheic keratosis: Secondary | ICD-10-CM | POA: Diagnosis not present

## 2020-11-08 DIAGNOSIS — D487 Neoplasm of uncertain behavior of other specified sites: Secondary | ICD-10-CM | POA: Diagnosis not present

## 2020-11-08 DIAGNOSIS — Z85828 Personal history of other malignant neoplasm of skin: Secondary | ICD-10-CM | POA: Diagnosis not present

## 2020-12-02 DIAGNOSIS — L570 Actinic keratosis: Secondary | ICD-10-CM | POA: Diagnosis not present

## 2020-12-24 DIAGNOSIS — Z1231 Encounter for screening mammogram for malignant neoplasm of breast: Secondary | ICD-10-CM | POA: Diagnosis not present

## 2020-12-24 LAB — HM MAMMOGRAPHY

## 2020-12-30 ENCOUNTER — Encounter: Payer: Self-pay | Admitting: *Deleted

## 2020-12-30 DIAGNOSIS — S82142A Displaced bicondylar fracture of left tibia, initial encounter for closed fracture: Secondary | ICD-10-CM

## 2020-12-30 DIAGNOSIS — M79671 Pain in right foot: Secondary | ICD-10-CM | POA: Diagnosis not present

## 2020-12-30 DIAGNOSIS — S92309A Fracture of unspecified metatarsal bone(s), unspecified foot, initial encounter for closed fracture: Secondary | ICD-10-CM

## 2020-12-30 DIAGNOSIS — M25532 Pain in left wrist: Secondary | ICD-10-CM | POA: Diagnosis not present

## 2020-12-30 DIAGNOSIS — M25562 Pain in left knee: Secondary | ICD-10-CM | POA: Diagnosis not present

## 2020-12-30 HISTORY — DX: Fracture of unspecified metatarsal bone(s), unspecified foot, initial encounter for closed fracture: S92.309A

## 2020-12-30 HISTORY — DX: Displaced bicondylar fracture of left tibia, initial encounter for closed fracture: S82.142A

## 2020-12-31 ENCOUNTER — Ambulatory Visit: Payer: Self-pay

## 2020-12-31 ENCOUNTER — Other Ambulatory Visit: Payer: Self-pay

## 2020-12-31 ENCOUNTER — Ambulatory Visit
Admission: RE | Admit: 2020-12-31 | Discharge: 2020-12-31 | Disposition: A | Discharge status: Home / Self Care | Payer: Medicare Other | Source: Ambulatory Visit | Attending: Medical | Admitting: Medical

## 2020-12-31 ENCOUNTER — Other Ambulatory Visit: Payer: Self-pay | Admitting: Medical

## 2020-12-31 DIAGNOSIS — R52 Pain, unspecified: Secondary | ICD-10-CM

## 2020-12-31 DIAGNOSIS — S82225A Nondisplaced transverse fracture of shaft of left tibia, initial encounter for closed fracture: Secondary | ICD-10-CM | POA: Diagnosis not present

## 2020-12-31 DIAGNOSIS — S82192A Other fracture of upper end of left tibia, initial encounter for closed fracture: Secondary | ICD-10-CM | POA: Diagnosis not present

## 2021-01-04 DIAGNOSIS — M79671 Pain in right foot: Secondary | ICD-10-CM | POA: Diagnosis not present

## 2021-01-07 DIAGNOSIS — S82102A Unspecified fracture of upper end of left tibia, initial encounter for closed fracture: Secondary | ICD-10-CM | POA: Diagnosis not present

## 2021-01-07 DIAGNOSIS — M25562 Pain in left knee: Secondary | ICD-10-CM | POA: Diagnosis not present

## 2021-01-10 ENCOUNTER — Ambulatory Visit: Payer: Medicare Other | Admitting: Family Medicine

## 2021-01-13 DIAGNOSIS — S82102S Unspecified fracture of upper end of left tibia, sequela: Secondary | ICD-10-CM | POA: Diagnosis not present

## 2021-01-27 DIAGNOSIS — S82102S Unspecified fracture of upper end of left tibia, sequela: Secondary | ICD-10-CM | POA: Diagnosis not present

## 2021-02-01 DIAGNOSIS — S82102S Unspecified fracture of upper end of left tibia, sequela: Secondary | ICD-10-CM | POA: Diagnosis not present

## 2021-02-01 DIAGNOSIS — M79671 Pain in right foot: Secondary | ICD-10-CM | POA: Diagnosis not present

## 2021-02-01 DIAGNOSIS — S92354D Nondisplaced fracture of fifth metatarsal bone, right foot, subsequent encounter for fracture with routine healing: Secondary | ICD-10-CM | POA: Diagnosis not present

## 2021-02-02 DIAGNOSIS — S82102D Unspecified fracture of upper end of left tibia, subsequent encounter for closed fracture with routine healing: Secondary | ICD-10-CM | POA: Diagnosis not present

## 2021-02-14 DIAGNOSIS — S82102D Unspecified fracture of upper end of left tibia, subsequent encounter for closed fracture with routine healing: Secondary | ICD-10-CM | POA: Diagnosis not present

## 2021-02-15 DIAGNOSIS — S82102D Unspecified fracture of upper end of left tibia, subsequent encounter for closed fracture with routine healing: Secondary | ICD-10-CM | POA: Diagnosis not present

## 2021-02-16 DIAGNOSIS — S82102D Unspecified fracture of upper end of left tibia, subsequent encounter for closed fracture with routine healing: Secondary | ICD-10-CM | POA: Diagnosis not present

## 2021-02-21 DIAGNOSIS — S82102D Unspecified fracture of upper end of left tibia, subsequent encounter for closed fracture with routine healing: Secondary | ICD-10-CM | POA: Diagnosis not present

## 2021-02-22 NOTE — Progress Notes (Signed)
Chief Complaint  Patient presents with   Diarrhea    All of last week she had diarrhea, she though maybe she ate something tat upset her stomach. Monday did BRAT diet and during the day was okay. Tuesday she ate kind of a regular diet but less than she normally eat, had diarrhea at night. This morning had cramping until she goes to the bathroom and "gets cleaned out". No nausea or vomiting. Bought some Culturelle. Took two Imodium Tues am and it did not help. Also took some last week and it did not help either.   She had diarrhea last week, which started after eating some left over chicken salad sandwich (?if had been left out too long). She is often okay during the day, but it bothers her at night.  She is eating dairy--had cereal last night with milk, has been eating greek yogurt.  She denies blood in stool, fever.  Denies abdominal pain other than cramping when she needs to go to the bathroom.  No travel, camping, raw/undercooked foods. No recent ABX. No sick contacts.  She is getting PT, around others.    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    aspirin EC 81 MG tablet Take 81 mg by mouth daily. Swallow whole.    Calcium Carbonate-Vitamin D 600-400 MG-UNIT tablet Take 1 tablet by mouth daily.     Glucosamine HCl 1000 MG TABS Take 1,000 mg by mouth 2 (two) times daily.    Lactobacillus Rhamnosus, GG, (CULTURELLE PO) Take 1 capsule by mouth daily.    Melatonin 5 MG CAPS Take by mouth.    Omega-3 Fatty Acids (FISH OIL) 1200 MG CAPS Take 1 capsule by mouth daily.     vitamin B-12 (CYANOCOBALAMIN) 1000 MCG tablet Take 1,000 mcg by mouth daily.    vitamin E 400 UNIT capsule Take 400 Units by mouth daily.    albuterol (VENTOLIN HFA) 108 (90 Base) MCG/ACT inhaler Inhale 1-2  puffs into the lungs every 6 (six) hours as needed for wheezing or shortness of breath. (Patient not taking: Reported on 02/23/2021) 02/23/2021: Uses infrequently, prn.   [DISCONTINUED] dextromethorphan-guaiFENesin (MUCINEX DM) 30-600 MG 12hr tablet Take 1 tablet by mouth 2 (two) times daily.    [DISCONTINUED] guaiFENesin-codeine 100-10 MG/5ML syrup Take 5-10 mLs by mouth at bedtime as needed for cough.    [DISCONTINUED] predniSONE (STERAPRED UNI-PAK 21 TAB) 10 MG (21) TBPK tablet Take by mouth daily. Take 6 tabs by mouth daily  for 2 days, then 5 tabs for 2 days, then 4 tabs for 2 days, then 3 tabs for 2 days, 2 tabs for 2 days, then 1 tab by mouth daily for 2 days    No facility-administered encounter medications on file as of 02/23/2021.   Allergies  Allergen Reactions   Erythromycin Swelling   Triple Antibiotic [Bacitracin-Neomycin-Polymyxin] Swelling    Blistering   ROS: No fever. No n/v. Diarrhea per HPI.  No urinary complaints. No vaginal discharge. No URI, chest pain, shortness of breath or other complaints.  See HPI   PHYSICAL EXAM:  BP 138/78   Pulse 72   Temp 97.8 F (36.6 C) (Tympanic)   Ht 5' 8.5" (1.74 m)   Wt 151 lb 6.4 oz (68.7 kg)   BMI 22.69  kg/m   Wt Readings from Last 3 Encounters:  02/23/21 151 lb 6.4 oz (68.7 kg)  12/29/19 162 lb 12.8 oz (73.8 kg)  12/11/18 162 lb 3.2 oz (73.6 kg)    Pleasant female in no distress HEENT: conjunctiva and sclera are clear, EOMI, wearing mask Neck: no lymphadenopathy or mass Heart: regular rate and rhythm Lungs: clear bilaterally Abdomen: hyperactive bowel sounds.  Nontender, nondistended, no mass Extremities: no edema. Neuro alert and oriented.  Using walker to ambulate.  ASSESSMENT/PLAN:  Diarrhea, unspecified type - possibly related to food she ate.  Diarrhea/gas exacerbated by ongoing dairy intake. BRAT, avoid dairy, probiotics. f/u if persists/worsens  Need for influenza vaccination - Plan: Flu Vaccine QUAD High  Dose(Fluad)  F/u as scheduled for AWV 04/2021, sooner prn.   Avoid all dairy for the next 5 days or so. Take the probiotic daily. Continue with the BRAT diet, but advancing as tolerating to a bland diet (avoid spicy, greasy, fried foods.)

## 2021-02-23 ENCOUNTER — Encounter: Payer: Self-pay | Admitting: Family Medicine

## 2021-02-23 ENCOUNTER — Other Ambulatory Visit: Payer: Self-pay

## 2021-02-23 ENCOUNTER — Ambulatory Visit (INDEPENDENT_AMBULATORY_CARE_PROVIDER_SITE_OTHER): Payer: Medicare Other | Admitting: Family Medicine

## 2021-02-23 VITALS — BP 138/78 | HR 72 | Temp 97.8°F | Ht 68.5 in | Wt 151.4 lb

## 2021-02-23 DIAGNOSIS — R197 Diarrhea, unspecified: Secondary | ICD-10-CM | POA: Diagnosis not present

## 2021-02-23 DIAGNOSIS — Z23 Encounter for immunization: Secondary | ICD-10-CM | POA: Diagnosis not present

## 2021-02-23 NOTE — Patient Instructions (Addendum)
Stay well hydrated. Avoid all dairy for the next 5 days or so. Take the probiotic daily. Continue with the BRAT diet, but advancing as tolerating to a bland diet (avoid spicy, greasy, fried foods.)  Contact us if symptoms persist or worsen, especially if any fever, blood in the stool, worsening abdominal pain.

## 2021-02-24 DIAGNOSIS — S82102D Unspecified fracture of upper end of left tibia, subsequent encounter for closed fracture with routine healing: Secondary | ICD-10-CM | POA: Diagnosis not present

## 2021-02-25 ENCOUNTER — Telehealth: Payer: Self-pay | Admitting: Family Medicine

## 2021-02-25 NOTE — Telephone Encounter (Signed)
Pt was notified and went ahead and scheduled an appt for Monday just in case. If she feels better she will cancel on monday

## 2021-02-25 NOTE — Telephone Encounter (Signed)
Pt calls and states that she is sill having the diarrhea really bad, she is eating the brat diet as recommenced and also using the culturelle. Pt states she ate some jello and it just come back out just like water, informed pt that you was out of the office today, She wants to know what she can do to help Pt can be reached at [phone] And pt uses [id number] - [place], [place] - [place].

## 2021-02-25 NOTE — Telephone Encounter (Signed)
Advise pt she can try some imodium (over-the-counter) as directed. Continue BRAT diet, avoiding all dairy, and try and limit any excess sugar, which can sometimes bother the stomach too. We will need to do labs and send off stool samples if she isn't better by Monday (to call Monday to schedule OV if not improving)

## 2021-02-28 ENCOUNTER — Other Ambulatory Visit: Payer: Self-pay

## 2021-02-28 ENCOUNTER — Encounter: Payer: Self-pay | Admitting: Family Medicine

## 2021-02-28 ENCOUNTER — Ambulatory Visit (INDEPENDENT_AMBULATORY_CARE_PROVIDER_SITE_OTHER): Payer: Medicare Other | Admitting: Family Medicine

## 2021-02-28 VITALS — BP 138/76 | HR 72 | Temp 98.2°F | Ht 68.5 in | Wt 147.6 lb

## 2021-02-28 DIAGNOSIS — R634 Abnormal weight loss: Secondary | ICD-10-CM

## 2021-02-28 DIAGNOSIS — R8290 Unspecified abnormal findings in urine: Secondary | ICD-10-CM

## 2021-02-28 DIAGNOSIS — R197 Diarrhea, unspecified: Secondary | ICD-10-CM | POA: Diagnosis not present

## 2021-02-28 LAB — POCT URINALYSIS DIP (PROADVANTAGE DEVICE)
Bilirubin, UA: NEGATIVE
Glucose, UA: NEGATIVE mg/dL
Nitrite, UA: POSITIVE — AB
Specific Gravity, Urine: 1.015
Urobilinogen, Ur: NEGATIVE
pH, UA: 6 (ref 5.0–8.0)

## 2021-02-28 NOTE — Progress Notes (Addendum)
Chief Complaint  Patient presents with   Diarrhea    Still trying to do BRAT diet. Had a poached egg and an English muffin. Made some rice with vegetable broth and that seems to slow it down. Wonders if she could have covid and that be her only symptom? Tested at home Sat with an expired test (exp 02/24/21).    Seen 12/7 for diarrhea. She reports having ongoing symptoms. She is having diarrhea within 30 mins after eating (breakfast and lunch).  She does report that overall the diarrhea has "slowed down", only having 2 episodes/day (without imodium)  The last 2 nights she had rice with dinner, which helped.  She didn't have diarrhea after dinner, not until the following morning, and wasn't as explosive.  Hasn't had any dairy. She only took probiotic for a few days, stopped because it didn't seem to help. She thought she was getting better, went 24 hours without anything, but then after breakfast yesterday she had a large bout of diarrhea.  She took 2 imodium yesterday morning.  Had some diarrhea after lunch.  She reports that Imodium "slows it down". Not taking regularly, not every day or with every episode.  She is drinking water. She states that her urine is pale. Got pedialyte yesterday.  Denies abdominal pain.  Has fecal urgency, but no longer having stomach cramps.  PMH, PSH, SH reviewed. Sister with irritable bowel syndrome, "went into colitis" and takes budesonide daily. Pt last had colonoscopy in 2010, notable only for diverticulosis Hasn't seen GI in >10 years (prev Dr. Penelope Coop)   Outpatient Encounter Medications as of 02/28/2021  Medication Sig Note   Melatonin 5 MG CAPS Take by mouth. 02/28/2021: Took last night   albuterol (VENTOLIN HFA) 108 (90 Base) MCG/ACT inhaler Inhale 1-2 puffs into the lungs every 6 (six) hours as needed for wheezing or shortness of breath. (Patient not taking: Reported on 02/23/2021) 02/23/2021: Uses infrequently, prn.   Ascorbic Acid (VITAMIN C) 1000 MG  tablet Take 1,000 mg by mouth daily. (Patient not taking: Reported on 02/28/2021) 02/28/2021: Did not take today   aspirin EC 81 MG tablet Take 81 mg by mouth daily. Swallow whole. (Patient not taking: Reported on 02/28/2021) 02/28/2021: Did not take today   Calcium Carbonate-Vitamin D 600-400 MG-UNIT tablet Take 1 tablet by mouth daily.  (Patient not taking: Reported on 02/28/2021) 02/28/2021: Did not take today   Glucosamine HCl 1000 MG TABS Take 1,000 mg by mouth 2 (two) times daily. (Patient not taking: Reported on 02/28/2021) 02/28/2021: Did not take today   Omega-3 Fatty Acids (FISH OIL) 1200 MG CAPS Take 1 capsule by mouth daily.  (Patient not taking: Reported on 02/28/2021) 02/28/2021: Did not take today   vitamin B-12 (CYANOCOBALAMIN) 1000 MCG tablet Take 1,000 mcg by mouth daily. (Patient not taking: Reported on 02/28/2021) 02/28/2021: Did not take today   vitamin E 400 UNIT capsule Take 400 Units by mouth daily. (Patient not taking: Reported on 02/28/2021) 02/28/2021: Did not take today   [DISCONTINUED] Lactobacillus Rhamnosus, GG, (CULTURELLE PO) Take 1 capsule by mouth daily. (Patient not taking: Reported on 02/28/2021)    No facility-administered encounter medications on file as of 02/28/2021.   Allergies  Allergen Reactions   Erythromycin Swelling   Triple Antibiotic [Bacitracin-Neomycin-Polymyxin] Swelling    Blistering   ROS: no fever, chills, black or bloody stools. No vomiting.  Denies dizziness, chest pain, shortness of breath or any urinary complaints. No abdominal pain. See HPI   PHYSICAL EXAM:  BP 138/76   Pulse 72   Temp 98.2 F (36.8 C) (Tympanic)   Ht 5' 8.5" (1.74 m)   Wt 147 lb 9.6 oz (67 kg)   BMI 22.12 kg/m   Wt Readings from Last 3 Encounters:  02/28/21 147 lb 9.6 oz (67 kg)  02/23/21 151 lb 6.4 oz (68.7 kg)  12/29/19 162 lb 12.8 oz (73.8 kg)   Pleasant, elderly female, appears a little frail/pale/fatigued. HEENT: conjunctiva and sclera are clear,  EOMI.  OP slightly dry.  Sinuses are nontender Neck: no lymphadenopathy, thyromegaly or mass Heart: regular rate and rhythm (pulse 80-90 on my exam) Lungs: clear bilaterally Back: no spinal or CVA tenderness Abdomen: soft, normal bowel sounds (not hyperactive).  Nontender, no mass or organomegaly. Extremities: no edema Psych: normal mood, affect, hygiene and grooming Neuro: alert and oriented. Ambulates with walker  Urine: SG 1.010,  mod ketones, small blood, small leuks, nitrite +   ASSESSMENT/PLAN:  Diarrhea, unspecified type - unclear etiology; 2 episodes daily. Cont BRAT diet, avoid dairy, restart probiotics, and Imodium with loose stools. Encouraged improved hydration - Plan: Comprehensive metabolic panel, CBC with Differential/Platelet, TSH, Cdiff NAA+O+P+Stool Culture, POCT Urinalysis DIP (Proadvantage Device)  Weight loss - suspect she is dehydrated. Encouraged adequate hydration - Plan: Comprehensive metabolic panel, CBC with Differential/Platelet, TSH, POCT Urinalysis DIP (Proadvantage Device)  Abnormal urinalysis - Plan: Urine Culture  CBC, c-met, TSH  Urine abnormal, suspect dehydration contributing, pt asymptomatic. Urine sent for culture. Await lab results to determine if any ABX are indicated while wating for culture.  Stool studies ordered.

## 2021-02-28 NOTE — Patient Instructions (Addendum)
Please be sure to stay well hydrated--drinking plenty of water and/or pedialyte. Continue to eat rice, and the [employer]. If any particular food triggers the diarrhea, try and avoid it. You may continue to use imodium as needed with episodes of diarrhea.  We are checking some labs today, and would like for you to return stool samples for [name] to do some studies on.  Please contact us if you develop any fever, nausea/vomiting, abdominal pain, or any blood in the stool.  Next steps, if everything is normal, is to get you in with a gastroenterologist.  Urine showed ketones--which we see when you haven't eaten much.  It also showed some leukocytes--we are going to send it for culture to make sure that you don't have an infection.  I hope we can get you to feeling better soon!

## 2021-03-01 LAB — COMPREHENSIVE METABOLIC PANEL
ALT: 9 IU/L (ref 0–32)
AST: 12 IU/L (ref 0–40)
Albumin/Globulin Ratio: 2 (ref 1.2–2.2)
Albumin: 4.4 g/dL (ref 3.7–4.7)
Alkaline Phosphatase: 66 IU/L (ref 44–121)
BUN/Creatinine Ratio: 13 (ref 12–28)
BUN: 9 mg/dL (ref 8–27)
Bilirubin Total: 0.2 mg/dL (ref 0.0–1.2)
CO2: 22 mmol/L (ref 20–29)
Calcium: 9.4 mg/dL (ref 8.7–10.3)
Chloride: 102 mmol/L (ref 96–106)
Creatinine, Ser: 0.72 mg/dL (ref 0.57–1.00)
Globulin, Total: 2.2 g/dL (ref 1.5–4.5)
Glucose: 91 mg/dL (ref 70–99)
Potassium: 4.1 mmol/L (ref 3.5–5.2)
Sodium: 141 mmol/L (ref 134–144)
Total Protein: 6.6 g/dL (ref 6.0–8.5)
eGFR: 85 mL/min/{1.73_m2} (ref >59)

## 2021-03-01 LAB — CBC WITH DIFFERENTIAL/PLATELET
Basophils Absolute: 0.1 10*3/uL (ref 0.0–0.2)
Basos: 1 %
EOS (ABSOLUTE): 0.1 10*3/uL (ref 0.0–0.4)
Eos: 1 %
Hematocrit: 38.2 % (ref 34.0–46.6)
Hemoglobin: 12.9 g/dL (ref 11.1–15.9)
Immature Grans (Abs): 0 10*3/uL (ref 0.0–0.1)
Immature Granulocytes: 0 %
Lymphocytes Absolute: 2.1 10*3/uL (ref 0.7–3.1)
Lymphs: 22 %
MCH: 29.8 pg (ref 26.6–33.0)
MCHC: 33.8 g/dL (ref 31.5–35.7)
MCV: 88 fL (ref 79–97)
Monocytes Absolute: 0.8 10*3/uL (ref 0.1–0.9)
Monocytes: 9 %
Neutrophils Absolute: 6.3 10*3/uL (ref 1.4–7.0)
Neutrophils: 67 %
Platelets: 315 10*3/uL (ref 150–450)
RBC: 4.33 x10E6/uL (ref 3.77–5.28)
RDW: 11.9 % (ref 11.7–15.4)
WBC: 9.5 10*3/uL (ref 3.4–10.8)

## 2021-03-01 LAB — TSH: TSH: 1.19 u[IU]/mL (ref 0.450–4.500)

## 2021-03-02 ENCOUNTER — Other Ambulatory Visit: Payer: Self-pay

## 2021-03-02 ENCOUNTER — Other Ambulatory Visit: Payer: Medicare Other

## 2021-03-02 DIAGNOSIS — R197 Diarrhea, unspecified: Secondary | ICD-10-CM

## 2021-03-03 ENCOUNTER — Telehealth: Payer: Self-pay | Admitting: Family Medicine

## 2021-03-03 DIAGNOSIS — R634 Abnormal weight loss: Secondary | ICD-10-CM

## 2021-03-03 DIAGNOSIS — R197 Diarrhea, unspecified: Secondary | ICD-10-CM

## 2021-03-03 NOTE — Telephone Encounter (Signed)
Advise pt that none of the stool studies are back yet.  Some take longer than others (and final not usually for a week). I think it is okay to go ahead and put in the referral to GI, since they likely wouldn't be able to get her in before next week anyway, when some of the results will be back. Refer to GI with dx diarrhea and weight loss.  Check with pt if she wants to go back to [place] (where she had her last colonoscopy in 2010) or if wants to switch to [place] (since her doctor retired)  Administrator, Civil Service

## 2021-03-03 NOTE — Telephone Encounter (Signed)
Pt called and wants to see if she could go ahead and get the referral placed to GI

## 2021-03-03 NOTE — Telephone Encounter (Signed)
Patient notified and referral placed to [facility]eur GI

## 2021-03-04 LAB — OVA AND PARASITE EXAMINATION

## 2021-03-04 LAB — URINE CULTURE

## 2021-03-05 ENCOUNTER — Encounter: Payer: Self-pay | Admitting: Family Medicine

## 2021-03-05 MED ORDER — NITROFURANTOIN MONOHYD MACRO 100 MG PO CAPS: 100.0000 mg | ORAL_CAPSULE | Freq: Two times a day (BID) | ORAL | 0 refills | Status: DC

## 2021-03-05 NOTE — Addendum Note (Signed)
Addended byRita Ohara on: 03/05/2021 02:16 PM   Modules accepted: Orders

## 2021-03-06 LAB — CDIFF NAA+O+P+STOOL CULTURE
E coli, Shiga toxin Assay: NEGATIVE
Toxigenic C. Difficile by PCR: NEGATIVE

## 2021-03-07 DIAGNOSIS — S82102D Unspecified fracture of upper end of left tibia, subsequent encounter for closed fracture with routine healing: Secondary | ICD-10-CM | POA: Diagnosis not present

## 2021-03-08 ENCOUNTER — Encounter: Payer: Self-pay | Admitting: Gastroenterology

## 2021-03-09 DIAGNOSIS — S82102D Unspecified fracture of upper end of left tibia, subsequent encounter for closed fracture with routine healing: Secondary | ICD-10-CM | POA: Diagnosis not present

## 2021-03-15 DIAGNOSIS — S82102D Unspecified fracture of upper end of left tibia, subsequent encounter for closed fracture with routine healing: Secondary | ICD-10-CM | POA: Diagnosis not present

## 2021-03-15 DIAGNOSIS — S92354D Nondisplaced fracture of fifth metatarsal bone, right foot, subsequent encounter for fracture with routine healing: Secondary | ICD-10-CM | POA: Diagnosis not present

## 2021-03-22 DIAGNOSIS — S82102D Unspecified fracture of upper end of left tibia, subsequent encounter for closed fracture with routine healing: Secondary | ICD-10-CM | POA: Diagnosis not present

## 2021-04-04 DIAGNOSIS — S82102D Unspecified fracture of upper end of left tibia, subsequent encounter for closed fracture with routine healing: Secondary | ICD-10-CM | POA: Diagnosis not present

## 2021-04-08 ENCOUNTER — Encounter: Payer: Self-pay | Admitting: Gastroenterology

## 2021-04-08 ENCOUNTER — Ambulatory Visit (INDEPENDENT_AMBULATORY_CARE_PROVIDER_SITE_OTHER): Payer: Medicare Other | Admitting: Gastroenterology

## 2021-04-08 VITALS — BP 112/50 | HR 81 | Ht 69.0 in | Wt 146.0 lb

## 2021-04-08 DIAGNOSIS — R630 Anorexia: Secondary | ICD-10-CM | POA: Diagnosis not present

## 2021-04-08 DIAGNOSIS — R634 Abnormal weight loss: Secondary | ICD-10-CM

## 2021-04-08 DIAGNOSIS — R194 Change in bowel habit: Secondary | ICD-10-CM | POA: Diagnosis not present

## 2021-04-08 MED ORDER — PLENVU 140 G PO SOLR: 1.0000 | ORAL | 0 refills | Status: DC

## 2021-04-08 NOTE — Progress Notes (Signed)
HPI: This is a very pleasant 81 year old woman who was referred to me by Rita Ohara, MD  to evaluate change in bowels, abdominal discomfort, weight loss, anorexia.    She had some orthopedic injuries in October.  While she was recuperating from these about a month later she noticed a significant change in her bowels, generalized lower abdominal discomforts, anorexia and unintentional weight loss.  She has lost 15 pounds since then.  Change in her bowels was looser stools, never bloody.  She was going about twice a day.  She has had to change her diet because of her lack of appetite and she is mainly eating bananas and mashed potatoes, toast, grilled chicken.  She is still having some lower abdominal discomforts, the bowel changes have improved but certainly not normal yet.  She was put on antibiotics for E. coli UTI about a month ago but that was after the symptoms started.  She has had no nausea or vomiting.  Tells me her sister had colitis.  Colon cancer does not run in her family   Old Data Reviewed: Lab testing December 2022 CBC was normal, complete metabolic profile was normal, TSH was normal, urinalysis was positive for E. coli infection.  Stool tests, negative for ova parasite, negative for routine culture and C. difficile.  Previously a patient of Dr. Acquanetta Sit at [facility] gastroenterology.  It looks like she had a colonoscopy with him June 2010.  I cannot find that actual report for any associated pathology however    Review of systems: Pertinent positive and negative review of systems were noted in the above HPI section. All other review negative.   Past Medical History:  Diagnosis Date   Allergic rhinitis, cause unspecified    Arthritis    "hands" (03/10/2014)   GERD (gastroesophageal reflux disease)    Metatarsal fracture 12/30/2020   right   Osteopenia    fosamax started 01/2012   Ovarian teratoma    left, removed 12/06   PONV (postoperative nausea and vomiting)    Pure  hypercholesterolemia    Shingles 03/2018   dx'd while in [facility], on right side of mouth, nose, lip   Tibial plateau fracture, left 12/30/2020    Past Surgical History:  Procedure Laterality Date   CATARACT EXTRACTION Left 11/2015   EYE SURGERY     FRACTURE SURGERY     GAS INSERTION Left 03/10/2014   Procedure: INSERTION OF [id number];  Surgeon: Hayden Pedro, MD;  Location: [facility];  Service: Ophthalmology;  Laterality: Left;   LASER PHOTO ABLATION Left 03/10/2014   Procedure: LASER PHOTO ABLATION-HEADSCOPE LASER;  Surgeon: Hayden Pedro, MD;  Location: [facility];  Service: Ophthalmology;  Laterality: Left;   SCLERAL BUCKLE Left 03/10/2014   SCLERAL BUCKLE WITH CRYO Left 03/10/2014   Procedure: SCLERAL BUCKLE WITH CRYO AND DIATHERMY;  Surgeon: Hayden Pedro, MD;  Location: [facility];  Service: Ophthalmology;  Laterality: Left;   TONSILLECTOMY     TOTAL ABDOMINAL HYSTERECTOMY  02/2005   w/BSO; L ovarian teratoma   TOTAL ABDOMINAL HYSTERECTOMY W/ BILATERAL SALPINGOOPHORECTOMY  12/06   L ovarian teratoma   WRIST FRACTURE SURGERY Right 02/26/14   plate and screws    Current Outpatient Medications  Medication Sig Dispense Refill   albuterol (VENTOLIN HFA) 108 (90 Base) MCG/ACT inhaler Inhale 1-2 puffs into the lungs every 6 (six) hours as needed for wheezing or shortness of breath. 1 each 0   Melatonin 5 MG CAPS Take by mouth at  bedtime.     Ascorbic Acid (VITAMIN C) 1000 MG tablet Take 1,000 mg by mouth daily. (Patient not taking: Reported on 02/28/2021)     No current facility-administered medications for this visit.    Allergies as of 04/08/2021 - Review Complete 04/08/2021  Allergen Reaction Noted   Erythromycin Swelling 01/04/2012   Triple antibiotic [bacitracin-neomycin-polymyxin] Swelling 03/09/2014    Family History  Problem Relation Age of Onset   Heart disease Mother    COPD Father        smoker   Fibromyalgia Sister    Irritable bowel syndrome Sister    Breast  cancer Maternal Grandmother        50's   Asthma Paternal Grandmother    Diabetes Paternal Aunt    Cancer - Colon Paternal Aunt 96    Social History   Socioeconomic History   Marital status: Married    Spouse name: Not on file   Number of children: 1   Years of education: Not on file   Highest education level: Not on file  Occupational History   Occupation: retired  Tobacco Use   Smoking status: Never   Smokeless tobacco: Never  Vaping Use   Vaping Use: Never used  Substance and Sexual Activity   Alcohol use: No   Drug use: No   Sexual activity: Not Currently    Partners: Male    Comment: husband with impotence issues  Other Topics Concern   Not on file  Social History Narrative   Married, lives with husband.  Son lives in [place], [place].  3 grandchildren.  Retired (2013; Engineer, materials).   Social Determinants of Health   Financial Resource Strain: Not on file  Food Insecurity: Not on file  Transportation Needs: Not on file  Physical Activity: Not on file  Stress: Not on file  Social Connections: Not on file  Intimate Partner Violence: Not on file     Physical Exam: Ht 5\' 9"  (1.753 m)    Wt 146 lb (66.2 kg)    BMI 21.56 kg/m  Constitutional: generally well-appearing Psychiatric: alert and oriented x3 Eyes: extraocular movements intact Mouth: oral pharynx moist, no lesions Neck: supple no lymphadenopathy Cardiovascular: heart regular rate and rhythm Lungs: clear to auscultation bilaterally Abdomen: soft, nontender, nondistended, no obvious ascites, no peritoneal signs, normal bowel sounds Extremities: no lower extremity edema bilaterally Skin: no lesions on visible extremities   Assessment and plan: 81 y.o. female with change in bowels, anorexia, generalized abdominal discomforts, unintentional weight loss  She has had both upper and lower GI symptoms as well as unintentional weight loss.  Blood work about 1 month ago was unrevealing.  She has not had  imaging test and since her pain symptoms are quite vague and she is not tender on examination I do not think she needs that just yet.  Instead I recommended we proceed with upper endoscopy and colonoscopy at the same time.  Please see the "Patient Instructions" section for addition details about the plan.   Owens Loffler, MD [facility] 04/08/2021, 10:33 AM  Cc: Rita Ohara, MD  Total time on date of encounter was 45  minutes (this included time spent preparing to see the patient reviewing records; obtaining and/or reviewing separately obtained history; performing a medically appropriate exam and/or evaluation; counseling and educating the patient and family if present; ordering medications, tests or procedures if applicable; and documenting clinical information in the health record).

## 2021-04-08 NOTE — Patient Instructions (Signed)
If you are age 80 or older, your body mass index should be between 23-30. Your Body mass index is 21.56 kg/m. If this is out of the aforementioned range listed, please consider follow up with your Primary Care Provider. ________________________________________________________  The [facility] providers would like to encourage you to use [facility] to communicate with providers for non-urgent requests or questions.  Due to long hold times on the telephone, sending your provider a message by [facility] may be a faster and more efficient way to get a response.  Please allow 48 business hours for a response.  Please remember that this is for non-urgent requests.  _______________________________________________________  Dennis Bast have been scheduled for an endoscopy and colonoscopy. Please follow the written instructions given to you at your visit today. Please pick up your prep supplies at the pharmacy within the next 1-3 days. If you use inhalers (even only as needed), please bring them with you on the day of your procedure.  Due to recent changes in healthcare laws, you may see the results of your imaging and laboratory studies on MyChart before your provider has had a chance to review them.  We understand that in some cases there may be results that are confusing or concerning to you. Not all laboratory results come back in the same time frame and the provider may be waiting for multiple results in order to interpret others.  Please give [name] 48 hours in order for your provider to thoroughly review all the results before contacting the office for clarification of your results.   Thank you for entrusting me with your care and choosing [facility].  Dr Ardis Hughs

## 2021-04-12 DIAGNOSIS — S82102D Unspecified fracture of upper end of left tibia, subsequent encounter for closed fracture with routine healing: Secondary | ICD-10-CM | POA: Diagnosis not present

## 2021-04-19 DIAGNOSIS — S82102D Unspecified fracture of upper end of left tibia, subsequent encounter for closed fracture with routine healing: Secondary | ICD-10-CM | POA: Diagnosis not present

## 2021-04-21 ENCOUNTER — Telehealth: Payer: Self-pay | Admitting: *Deleted

## 2021-04-21 DIAGNOSIS — E7800 Pure hypercholesterolemia, unspecified: Secondary | ICD-10-CM

## 2021-04-21 NOTE — Telephone Encounter (Signed)
Patient advised.

## 2021-04-21 NOTE — Telephone Encounter (Signed)
Patient has AWV Monday afternoon and would like to come tomorrow am for fasting labs, can you please order. Thanks.

## 2021-04-21 NOTE — Telephone Encounter (Signed)
Advise pt--only doing lipids tomorrow.  She had lots of labs done in December, and all were normal. Lipid order entered.

## 2021-04-22 ENCOUNTER — Other Ambulatory Visit: Payer: Self-pay

## 2021-04-22 ENCOUNTER — Other Ambulatory Visit: Payer: Medicare Other

## 2021-04-22 DIAGNOSIS — E7800 Pure hypercholesterolemia, unspecified: Secondary | ICD-10-CM

## 2021-04-23 LAB — LIPID PANEL
Chol/HDL Ratio: 3.2 ratio (ref 0.0–4.4)
Cholesterol, Total: 181 mg/dL (ref 100–199)
HDL: 56 mg/dL (ref >39)
LDL Chol Calc (NIH): 111 mg/dL — ABNORMAL HIGH (ref 0–99)
Triglycerides: 75 mg/dL (ref 0–149)
VLDL Cholesterol Cal: 14 mg/dL (ref 5–40)

## 2021-04-24 NOTE — Progress Notes (Signed)
Chief Complaint  Patient presents with   Medicare Wellness    Nonfasting AWV/CPE with pelvic. Still having to eat very carefully, not having any diarrhea at the moment. Wants to know if she should resume her supplements, Dr. Ardis Hughs office was supposed to call her and let her know and they never did.  No new concerns. She did get 2nd dose of Shingrix-put in chart. She wasn't sure if she should wait on covid booster until she is feeling better.   Joan Morales is a 81 y.o. female who presents for annual wellness visit and follow-up on chronic medical conditions.  She had lipids done prior to her visit, see below.  Bowel changes/diarrhea, decreased appetite and weight loss.  She saw Dr. Ardis Hughs last month, and plan is for EGD and colonoscopy later this month. Appetite is a little better.  Bowels are pretty normal, as long as she watches her diet carefully.  She has had some flares of diarrhea, can't recall what triggered it.  Using lactase tablets prior to cheese. She was noted to have UTI at that time as well, E.coli, treated.She currently denies urinary complaints.  URI symptoms for the last week, "just a mild cold".  She has some residual runny nose. She needed to use some albuterol yesterday. She has a h/o seasonal allergies which can cause chest congestion and trigger wheezing.  Last year we discussed using nasal steroids and oral antihistamines during allergy season. She used Nasacort some, which helped. Has albuterol to use prn.  Osteopenia:  She started on fosamax in 02/2012.  Initial T-score was T-2.4, and high risk FRAX score (hip and elsewhere).  She started having problems with her right jaw, and stopped taking the alendronate in 09/2016.  Last DEXA was 11/21/2018, with T-2 at R fem neck, -1.3 at L fem neck, -1.6 at total L femur; reportedly stable at hips. She is due for recheck of DEXA. She has gotten these at [facility]. She stopped taking her calcium supplement and vitamin d (glucosamine at  66mg, and calcium had 12 mcg of vitamin D3) in December when she got sick with diarrhea. Not getting any weight-bearing exercise.  Mild hyperlipidemia:  LDL in 2018 was up to 145, higher than prior checks. Recheck in 2019 LDL was down to 134  LDL on last check was up to 154, in 12/2019.   Diet has changed some related to her stomach/diarrhea.   She no longer eats any oatmeal (caused diarrhea).  She eats 4-5 eggs/week, mostly just the egg whites. She no longer eats any red meat, mostly chicken. Just occasional cheese. No longer has any bacon or sausage.  She fell in 12/2020, fractured her L tibial plateau. She has been getting physical therapy, last session will be tomorrow.  Has slight discomfort in the L knee, but doing better, and is doing home exercises.  Immunization History  Administered Date(s) Administered   Fluad Quad(high Dose 65+) 12/11/2018, 01/14/2020, 02/23/2021   Influenza Split 01/04/2012   Influenza, High Dose Seasonal PF 12/15/2013, 02/26/2015, 12/05/2017   PFIZER(Purple Top)SARS-COV-2 Vaccination 05/03/2019, 05/28/2019, 12/29/2019   Pneumococcal Conjugate-13 06/04/2013   Pneumococcal Polysaccharide-23 09/17/2008, 12/11/2018   Td 10/20/2015   Tdap 05/07/2005   Zoster Recombinat (Shingrix) 12/20/2017, 09/08/2020   Last Pap smear: s/p hysterectomy   Last mammogram: 12/2020 Last colonoscopy: 08/2008 (diverticulosis), scheduled with Dr. JArdis Hughsfor later this month Last DEXA: 11/2018 T-2 R fem neck, -1.3 L fem neck, -1.6 total L femur (reportedly stable at hips Dentist:  twice yearly for cleanings Ophtho:  Every 9 months Exercise: has been getting physical therapy once a week, and doing home exercises.  Prior to her injury she had been doing yardwork and walking. No weight-bearing exercise. Has some hand weights at home.  Vitamin D screen: normal 09/2016, level of  36   Patient Care Team: Rita Ohara, MD as PCP - General (Family Medicine) Rolm Bookbinder, MD as Consulting  Physician (Dermatology) Renette Butters, MD as Attending Physician (Orthopedic Surgery) Armond Hang, MD as Consulting Physician (Orthopedic Surgery) Hayden Pedro, MD as Consulting Physician (Ophthalmology) Milus Banister, MD as Attending Physician (Gastroenterology) Dr. Moss Mc DDS as Consulting Physician (Dentistry) Faythe Casa, [place] Warden Fillers, MD as Consulting Physician (Ophthalmology) Ulla Gallo, MD as Consulting Physician (Dermatology)   Depression Screening: [facility] Visit from 04/25/2021 in [place]  PHQ-2 Total Score 0        Falls screen:  Fall Risk  04/25/2021 02/23/2021 12/29/2019 12/11/2018 12/05/2017  Falls in the past year? 1 1 0 0 No  Comment - - - - -  Number falls in past yr: 0 1 - - -  Comment - - - - -  Injury with Fall? 1 1 - - -  Comment fx R 5th metatarsal and tibial plateau fx R 5th metatarsal and L tibia - - -  Risk for fall due to : History of fall(s) History of fall(s) - - -  Follow up Falls evaluation completed Falls evaluation completed - - -     Functional Status Survey: Is the patient deaf or have difficulty hearing?: No Does the patient have difficulty seeing, even when wearing glasses/contacts?: No Does the patient have difficulty concentrating, remembering, or making decisions?: Yes (short term memory, sometimes) Does the patient have difficulty walking or climbing stairs?: Yes (left knee) Does the patient have difficulty dressing or bathing?: No Does the patient have difficulty doing errands alone such as visiting a doctor's office or shopping?: No  Mini-Cog Scoring: 5    End of Life Discussion:  Patient has a living will and medical power of attorney.  We do not have copies.   PMH, PSH, SH and FH were reviewed and updated  Outpatient Encounter Medications as of 04/25/2021  Medication Sig Note   albuterol (VENTOLIN HFA) 108 (90 Base) MCG/ACT inhaler Inhale 1-2 puffs into the lungs  every 6 (six) hours as needed for wheezing or shortness of breath. 04/25/2021: Used yesterday   Melatonin 5 MG CAPS Take by mouth at bedtime.    Ascorbic Acid (VITAMIN C) 1000 MG tablet Take 1,000 mg by mouth daily. (Patient not taking: Reported on 02/28/2021)    PEG-KCl-NaCl-NaSulf-Na Asc-C (PLENVU) 140 g SOLR Take 1 kit by mouth as directed. Use coupon: BIN: [id number] PNC: CNRX Group: [id number] ID: [id number] (Patient not taking: Reported on 04/25/2021)    No facility-administered encounter medications on file as of 04/25/2021.   Allergies  Allergen Reactions   Erythromycin Swelling   Triple Antibiotic [Bacitracin-Neomycin-Polymyxin] Swelling    Blistering   Stopped calcium, glucosamine/chondroitin/D, vitamin E, fish oil and B12 in December, when she started with diarrhea.  Allergies  Allergen Reactions   Erythromycin Swelling   Triple Antibiotic [Bacitracin-Neomycin-Polymyxin] Swelling    Blistering    ROS: The patient denies anorexia, fever, headaches, decreased hearing, ear pain, sore throat, breast concerns, chest pain, palpitations, dizziness, syncope, dyspnea on exertion, swelling, nausea, vomiting, diarrhea, constipation, abdominal pain, melena, hematochezia, incontinence, dysuria, vaginal bleeding, discharge,  odor or itch, genital lesions, numbness, tingling, weakness, tremor, suspicious skin lesions, depression, anxiety, abnormal bleeding/bruising, or enlarged lymph nodes.   Bowels are much better, as long as she is careful with her diet. L knee (tib platea fx) is improving. R foot fracture healed, no longer painful Weight loss persists; appetite improving.   PHYSICAL EXAM:  BP 130/82    Pulse 84    Temp 98.6 F (37 C) (Tympanic)    Ht 5' 8" (1.727 m)    Wt 144 lb 3.2 oz (65.4 kg)    BMI 21.93 kg/m   Wt Readings from Last 3 Encounters:  04/25/21 144 lb 3.2 oz (65.4 kg)  04/08/21 146 lb (66.2 kg)  02/28/21 147 lb 9.6 oz (67 kg)  02/23/21 151# 6.4 oz (first visit for  diarrhea) 12/2019 (last AWV) 162# 12.8 oz   General Appearance:   Alert, cooperative, no distress, appears stated age. Only sounds minimally congested, no coughing or throat-clearing  Head:   Normocephalic, without obvious abnormality, atraumatic    Eyes:   PERRL, conjunctiva/corneas clear, EOM's intact, fundi benign    Ears:   Normal TM's and external ear canals.   Nose:   Not examined, wearing mask due to COVID-19 pandemic    Throat:   Not examined, wearing mask due to COVID-19 pandemic    Neck:   Supple, no lymphadenopathy; thyroid: no enlargement/ tenderness/nodules; no carotid bruit or JVD    Back:   Spine nontender, no curvature, ROM normal, no CVA tenderness  Lungs:   Clear to auscultation bilaterally without wheezes, rales or ronchi; respirations unlabored    Chest Wall:   No tenderness or deformity. Prominent manubrium, nontender  Heart:   Regular rate and rhythm, S1 and S2 normal, no murmur, rub   or gallop    Breast Exam:   No tenderness, masses, or nipple discharge or inversion. No axillary lymphadenopathy    Abdomen:   Soft, non-tender, nondistended, normoactive bowel sounds,   no masses, no hepatosplenomegaly    Genitalia:   Normal external genitalia without lesions, mild atrophic changes. BUS and vagina normal; bimanual exam normal--no pelvic masses appreciable, no tenderness. Pap not performed    Rectal:   Deferred   Extremities:   No clubbing or cyanosis. Trace edema L>R. Bilateral bunions, hammertoes and callouses. 2nd toes crossed/overlapping great toes bilaterally. Onychomycotic, thickened nails. Arthritic deformity to  2nd and 3rd DIP's in hands bilaterally. Unchanged from prior exams  Pulses:   2+ and symmetric all extremities    Skin:   Skin color, texture, turgor normal, no rashes. Scattered cherry hemangiomas, some SK's. Purpura noted on R hand.  Lymph nodes:   Cervical, supraclavicular, inguinal and axillary nodes normal    Neurologic:   Normal strength, sensation  and gait; reflexes 2+ and symmetric throughout                           Psych:    Normal mood, affect, hygiene and grooming  Lab Results  Component Value Date   CHOL 181 04/22/2021   HDL 56 04/22/2021   LDLCALC 111 (H) 04/22/2021   TRIG 75 04/22/2021   CHOLHDL 3.2 04/22/2021     ASSESSMENT/PLAN:  Medicare annual wellness visit, subsequent  Pure hypercholesterolemia - significantly improved (many changes in diet related to her diarrhea). Cont low chol diet.  Osteopenia, unspecified location - due for DEXA, to schedule at [facility]. Reviewed Ca, D and weight bearing exercise recommendations.  Weight loss - along with bowel changes. She is scheduled for EGD and colonoscopy alter this month.  Senile purpura (HCC) - noted on R hand, from minimal trauma   Discussed monthly self breast exams and yearly mammograms; at least 30 minutes of aerobic activity at least 5 days/week, weight-bearing exercise at least 2x/wk; proper sunscreen use reviewed; healthy diet, including goals of calcium and vitamin D intake and alcohol recommendations (less than or equal to 1 drink/day) reviewed; regular seatbelt use; changing batteries in smoke detectors. Immunization recommendations discussed--continue yearly flu shots. Will return for [place] for covid booster. Colon cancer screening is scheduled with Dr. Ardis Hughs. DEXA due--to schedule at [facility].   MOST form reviewed and updated/signed.  Full code, full care, no prolonged IV fluids/tube feeds. Requested copy of living will and healthcare power of attorney.    Medicare Attestation I have personally reviewed: The patient's medical and social history Their use of alcohol, tobacco or illicit drugs Their current medications and supplements The patient's functional ability including ADLs,fall risks, home safety risks, cognitive, and hearing and visual impairment Diet and physical activities Evidence for depression or mood disorders  The patient's weight,  height, BMI have been recorded in the chart.  I have made referrals, counseling, and provided education to the patient based on review of the above and I have provided the patient with a written personalized care plan for preventive services.

## 2021-04-24 NOTE — Patient Instructions (Signed)
HEALTH MAINTENANCE RECOMMENDATIONS:  It is recommended that you get at least 30 minutes of aerobic exercise at least 5 days/week (for weight loss, you may need as much as 60-90 minutes). This can be any activity that gets your heart rate up. This can be divided in 10-15 minute intervals if needed, but try and build up your endurance at least once a week.  Weight bearing exercise is also recommended twice weekly.  Eat a healthy diet with lots of vegetables, fruits and fiber.  "Colorful" foods have a lot of vitamins (ie green vegetables, tomatoes, red peppers, etc).  Limit sweet tea, regular sodas and alcoholic beverages, all of which has a lot of calories and sugar.  Up to 1 alcoholic drink daily may be beneficial for women (unless trying to lose weight, watch sugars).  Drink a lot of water.  Calcium recommendations are 1200-1500 mg daily (1500 mg for postmenopausal women or women without ovaries), and vitamin D 1000 IU daily.  This should be obtained from diet and/or supplements (vitamins), and calcium should not be taken all at once, but in divided doses.  Monthly self breast exams and yearly mammograms for women over the age of 16 is recommended.  Sunscreen of at least SPF 30 should be used on all sun-exposed parts of the skin when outside between the hours of 10 am and 4 pm (not just when at beach or pool, but even with exercise, golf, tennis, and yard work!)  Use a sunscreen that says "broad spectrum" so it covers both UVA and UVB rays, and make sure to reapply every 1-2 hours.  Remember to change the batteries in your smoke detectors when changing your clock times in the spring and fall. Carbon monoxide detectors are recommended for your home.  Use your seat belt every time you are in a car, and please drive safely and not be distracted with cell phones and texting while driving.   Joan Morales , Thank you for taking time to come for your Medicare Wellness Visit. I appreciate your ongoing  commitment to your health goals. Please review the following plan we discussed and let me know if I can assist you in the future.   This is a list of the screening recommended for you and due dates:  Health Maintenance  Topic Date Due   Zoster (Shingles) Vaccine (2 of 2) 02/14/2018   COVID-19 Vaccine (4 - Booster for Pfizer series) 02/23/2020   Mammogram  12/24/2021   Tetanus Vaccine  10/19/2025   Pneumonia Vaccine  Completed   Flu Shot  Completed   DEXA scan (bone density measurement)  Completed   Hepatitis C Screening: USPSTF Recommendation to screen - Ages 26-79 yo.  Completed   HPV Vaccine  Aged Out    You are due for another bone density scan. Please call [facility] to schedule this (they will fax [name] an order to be signed).  I recommend getting the 2nd dose of Shingrix.  Since you have Medicare, you will need to get this from the pharmacy, as it is covered by Part D. It may not be as effective (not the 90% that is quoted for when given at the appropriate spacing), but still worth getting the 2nd dose. They do not recommend starting the series over. This should be separated from other vaccines by at least 2 weeks. There should no longer be any out of pocket charge (copay) for this vaccine (as of 03/20/21).  Please bring [name] copies of your Living Will  and Joan Morales at your convenience, so that it can be scanned into your medical chart.

## 2021-04-25 ENCOUNTER — Encounter: Payer: Self-pay | Admitting: Family Medicine

## 2021-04-25 ENCOUNTER — Ambulatory Visit (INDEPENDENT_AMBULATORY_CARE_PROVIDER_SITE_OTHER): Payer: Medicare Other | Admitting: Family Medicine

## 2021-04-25 ENCOUNTER — Other Ambulatory Visit: Payer: Self-pay

## 2021-04-25 VITALS — BP 130/82 | HR 84 | Temp 98.6°F | Ht 68.0 in | Wt 144.2 lb

## 2021-04-25 DIAGNOSIS — M8580 Other specified disorders of bone density and structure, unspecified site: Secondary | ICD-10-CM | POA: Diagnosis not present

## 2021-04-25 DIAGNOSIS — Z01419 Encounter for gynecological examination (general) (routine) without abnormal findings: Secondary | ICD-10-CM

## 2021-04-25 DIAGNOSIS — E7800 Pure hypercholesterolemia, unspecified: Secondary | ICD-10-CM

## 2021-04-25 DIAGNOSIS — D692 Other nonthrombocytopenic purpura: Secondary | ICD-10-CM

## 2021-04-25 DIAGNOSIS — Z0000 Encounter for general adult medical examination without abnormal findings: Secondary | ICD-10-CM

## 2021-04-25 DIAGNOSIS — R634 Abnormal weight loss: Secondary | ICD-10-CM

## 2021-04-26 DIAGNOSIS — S82102D Unspecified fracture of upper end of left tibia, subsequent encounter for closed fracture with routine healing: Secondary | ICD-10-CM | POA: Diagnosis not present

## 2021-05-04 ENCOUNTER — Telehealth: Payer: Self-pay | Admitting: Family Medicine

## 2021-05-04 MED ORDER — AMOXICILLIN 875 MG PO TABS: 875.0000 mg | ORAL_TABLET | Freq: Two times a day (BID) | ORAL | 0 refills | Status: DC

## 2021-05-04 NOTE — Telephone Encounter (Signed)
Pt called and states she seen you last week for a wellness and also was sick at the same time, states she still has a runny nose cough and congestion in her chest, and has some green mucous she is wanting to know if you will send her something in, States she has been using her inhaler but that is not helping,  Pt uses [id number] - [place], [place] - [address]

## 2021-05-04 NOTE — Telephone Encounter (Signed)
Patient advised.

## 2021-05-04 NOTE — Telephone Encounter (Signed)
Advise pt that I sent in rx for antibiotic (amoxil). Needs f/u visit with another provider next week if not improving.

## 2021-05-10 ENCOUNTER — Encounter: Payer: Self-pay | Admitting: Gastroenterology

## 2021-05-10 ENCOUNTER — Ambulatory Visit (AMBULATORY_SURGERY_CENTER): Payer: Medicare Other | Admitting: Gastroenterology

## 2021-05-10 VITALS — BP 124/58 | HR 88 | Temp 98.6°F | Resp 13 | Ht 69.0 in | Wt 146.0 lb

## 2021-05-10 DIAGNOSIS — K5730 Diverticulosis of large intestine without perforation or abscess without bleeding: Secondary | ICD-10-CM | POA: Diagnosis not present

## 2021-05-10 DIAGNOSIS — K2970 Gastritis, unspecified, without bleeding: Secondary | ICD-10-CM | POA: Diagnosis not present

## 2021-05-10 DIAGNOSIS — R194 Change in bowel habit: Secondary | ICD-10-CM | POA: Diagnosis not present

## 2021-05-10 DIAGNOSIS — R197 Diarrhea, unspecified: Secondary | ICD-10-CM

## 2021-05-10 DIAGNOSIS — R634 Abnormal weight loss: Secondary | ICD-10-CM | POA: Diagnosis not present

## 2021-05-10 DIAGNOSIS — K319 Disease of stomach and duodenum, unspecified: Secondary | ICD-10-CM | POA: Diagnosis not present

## 2021-05-10 MED ORDER — SODIUM CHLORIDE 0.9 % IV SOLN: 500.0000 mL | Freq: Once | INTRAVENOUS | Status: DC

## 2021-05-10 NOTE — Progress Notes (Signed)
Pt in recovery with monitors in place, VSS. Report given to receiving RN. Bite guard was placed with pt awake to ensure comfort. No dental or soft tissue damage noted. 

## 2021-05-10 NOTE — Op Note (Signed)
[place] Patient Name: Joan Morales Procedure Date: 05/10/2021 1:49 PM MRN: [id number] Endoscopist: Milus Banister , MD Age: 81 Referring MD:  Date of Birth: May 29, 1940 Gender: Female Account #: [id number] Procedure:                Colonoscopy Indications:              change in bowel habits, anorexia, weight loss,                            generalized abd pains, loose stools after any oral                            intake Medicines:                Monitored Anesthesia Care Procedure:                Pre-Anesthesia Assessment:                           - Prior to the procedure, a History and Physical                            was performed, and patient medications and                            allergies were reviewed. The patient's tolerance of                            previous anesthesia was also reviewed. The risks                            and benefits of the procedure and the sedation                            options and risks were discussed with the patient.                            All questions were answered, and informed consent                            was obtained. Prior Anticoagulants: The patient has                            taken no previous anticoagulant or antiplatelet                            agents. ASA Grade Assessment: III - A patient with                            severe systemic disease. After reviewing the risks                            and benefits, the patient was deemed in  satisfactory condition to undergo the procedure.                           After obtaining informed consent, the colonoscope                            was passed under direct vision. Throughout the                            procedure, the patient's blood pressure, pulse, and                            oxygen saturations were monitored continuously. The                            CF [id number] #[id number] was introduced through the anus                             and advanced to the the terminal ileum. The                            colonoscopy was performed without difficulty. The                            patient tolerated the procedure well. The quality                            of the bowel preparation was good. The terminal                            ileum, ileocecal valve, appendiceal orifice, and                            rectum were photographed. Scope In: 2:10:40 PM Scope Out: 2:21:42 PM Scope Withdrawal Time: 0 hours 7 minutes 31 seconds  Total Procedure Duration: 0 hours 11 minutes 2 seconds  Findings:                 Multiple small and large-mouthed diverticula were                            found in the left colon.                           Biopsies for histology were taken with a cold                            forceps from the entire colon for evaluation of                            microscopic colitis.                           The terminal ileum appeared normal.  The exam was otherwise without abnormality on                            direct and retroflexion views. Complications:            No immediate complications. Estimated blood loss:                            None. Estimated Blood Loss:     Estimated blood loss: none. Impression:               - Diverticulosis in the left colon.                           - The examined portion of the ileum was normal.                           - The examination was otherwise normal on direct                            and retroflexion views.                           - Biopsies were taken with a cold forceps from the                            entire colon for evaluation of microscopic colitis. Recommendation:           - EGD now.                           - Await pathology results. Milus Banister, MD 05/10/2021 2:24:41 PM This report has been signed electronically.

## 2021-05-10 NOTE — Progress Notes (Signed)
HPI: This is a woman with generalized abd pain, weight loss, change in bowel habits   ROS: complete GI ROS as described in HPI, all other review negative.  Constitutional:  No unintentional weight loss   Past Medical History:  Diagnosis Date   Allergic rhinitis, cause unspecified    Arthritis    "hands" (03/10/2014)   GERD (gastroesophageal reflux disease)    Metatarsal fracture 12/30/2020   right 5th   Osteopenia    fosamax started 01/2012   Ovarian teratoma    left, removed 12/06   PONV (postoperative nausea and vomiting)    Pure hypercholesterolemia    Shingles 03/2018   dx'd while in [facility], on right side of mouth, nose, lip   Tibial plateau fracture, left 12/30/2020    Past Surgical History:  Procedure Laterality Date   CATARACT EXTRACTION Left 11/2015   EYE SURGERY     FRACTURE SURGERY     GAS INSERTION Left 03/10/2014   Procedure: INSERTION OF GAS-C3F8;  Surgeon: Hayden Pedro, MD;  Location: [place];  Service: Ophthalmology;  Laterality: Left;   LASER PHOTO ABLATION Left 03/10/2014   Procedure: LASER PHOTO ABLATION-HEADSCOPE LASER;  Surgeon: Hayden Pedro, MD;  Location: [place];  Service: Ophthalmology;  Laterality: Left;   SCLERAL BUCKLE Left 03/10/2014   SCLERAL BUCKLE WITH CRYO Left 03/10/2014   Procedure: SCLERAL BUCKLE WITH CRYO AND DIATHERMY;  Surgeon: Hayden Pedro, MD;  Location: [place];  Service: Ophthalmology;  Laterality: Left;   TONSILLECTOMY     TOTAL ABDOMINAL HYSTERECTOMY  02/2005   w/BSO; L ovarian teratoma   TOTAL ABDOMINAL HYSTERECTOMY W/ BILATERAL SALPINGOOPHORECTOMY  12/06   L ovarian teratoma   WRIST FRACTURE SURGERY Right 02/26/14   plate and screws    Current Outpatient Medications  Medication Sig Dispense Refill   albuterol (VENTOLIN HFA) 108 (90 Base) MCG/ACT inhaler Inhale 1-2 puffs into the lungs every 6 (six) hours as needed for wheezing or shortness of breath. 1 each 0   amoxicillin (AMOXIL) 875 MG tablet Take 1 tablet  (875 mg total) by mouth 2 (two) times daily. 20 tablet 0   Ascorbic Acid (VITAMIN C) 1000 MG tablet Take 1,000 mg by mouth daily. (Patient not taking: Reported on 02/28/2021)     Melatonin 5 MG CAPS Take by mouth at bedtime.     PEG-KCl-NaCl-NaSulf-Na Asc-C (PLENVU) 140 g SOLR Take 1 kit by mouth as directed. Use coupon: BIN: [id number] PNC: CNRX Group: [id number] ID: [id number] (Patient not taking: Reported on 04/25/2021) 1 each 0   No current facility-administered medications for this visit.    Allergies as of 05/10/2021 - Review Complete 05/10/2021  Allergen Reaction Noted   Erythromycin Swelling 01/04/2012   Triple antibiotic [bacitracin-neomycin-polymyxin] Swelling 03/09/2014    Family History  Problem Relation Age of Onset   Heart disease Mother    COPD Father        smoker   Fibromyalgia Sister    Irritable bowel syndrome Sister    Colitis Sister    Breast cancer Maternal Grandmother        50's   Asthma Paternal Grandmother    Diabetes Paternal Aunt    Cancer - Colon Paternal Aunt 96    Social History   Socioeconomic History   Marital status: Married    Spouse name: Not on file   Number of children: 1   Years of education: Not on file   Highest education level: Not on file  Occupational History  Occupation: retired  °Tobacco Use  ° Smoking status: Never  ° Smokeless tobacco: Never  °Vaping Use  ° Vaping Use: Never used  °Substance and Sexual Activity  ° Alcohol use: No  ° Drug use: No  ° Sexual activity: Not Currently  °  Partners: Male  °  Comment: husband with impotence issues  °Other Topics Concern  ° Not on file  °Social History Narrative  ° Married, lives with husband.  Son lives in [place], [place].  3 grandchildren.  Retired (2013; brokers assistant).  °   ° Reviewed/updated 04/2021  ° °Social Determinants of Health  ° °Financial Resource Strain: Not on file  °Food Insecurity: Not on file  °Transportation Needs: Not on file  °Physical Activity: Not on file  °Stress: Not on  file  °Social Connections: Not on file  °Intimate Partner Violence: Not on file  ° ° ° °Physical Exam: °BP 134/74    Pulse (!) 110    Temp 98.6 °F (37 °C)    Ht 5' 9" (1.753 m)    Wt 146 lb (66.2 kg)    SpO2 96%    BMI 21.56 kg/m²  °Constitutional: generally well-appearing °Psychiatric: alert and oriented x3 °Lungs: CTA bilaterally °Heart: no MCR ° °Assessment and plan: °81 y.o. female with generalized abd pains, weight loss, change in bowel habits ° °Egd and colonoscopy today ° °Care is appropriate for the ambulatory setting. ° °Daniel Jacobs, MD °[facility] °05/10/2021, 1:45 PM ° ° ° °

## 2021-05-10 NOTE — Patient Instructions (Addendum)
Handout on diverticulosis and Gastritis.  Await pathology result. If the biopsies are not helpful, the next step in workup will be CT scan abd/pelvis.  YOU HAD AN ENDOSCOPIC PROCEDURE TODAY AT [facility]:   Refer to the procedure report that was given to you for any specific questions about what was found during the examination.  If the procedure report does not answer your questions, please call your gastroenterologist to clarify.  If you requested that your care partner not be given the details of your procedure findings, then the procedure report has been included in a sealed envelope for you to review at your convenience later.  YOU SHOULD EXPECT: Some feelings of bloating in the abdomen. Passage of more gas than usual.  Walking can help get rid of the air that was put into your GI tract during the procedure and reduce the bloating. If you had a lower endoscopy (such as a colonoscopy or flexible sigmoidoscopy) you may notice spotting of blood in your stool or on the toilet paper. If you underwent a bowel prep for your procedure, you may not have a normal bowel movement for a few days.  Please Note:  You might notice some irritation and congestion in your nose or some drainage.  This is from the oxygen used during your procedure.  There is no need for concern and it should clear up in a day or so.  SYMPTOMS TO REPORT IMMEDIATELY:  Following lower endoscopy (colonoscopy or flexible sigmoidoscopy):  Excessive amounts of blood in the stool  Significant tenderness or worsening of abdominal pains  Swelling of the abdomen that is new, acute  Fever of 100F or higher  Following upper endoscopy (EGD)  Vomiting of blood or coffee ground material  New chest pain or pain under the shoulder blades  Painful or persistently difficult swallowing  New shortness of breath  Fever of 100F or higher  Black, tarry-looking stools  For urgent or emergent issues, a gastroenterologist can be  reached at any hour by calling [phone]. Do not use MyChart messaging for urgent concerns.    DIET:  We do recommend a small meal at first, but then you may proceed to your regular diet.  Drink plenty of fluids but you should avoid alcoholic beverages for 24 hours.  ACTIVITY:  You should plan to take it easy for the rest of today and you should NOT DRIVE or use heavy machinery until tomorrow (because of the sedation medicines used during the test).    FOLLOW UP: Our staff will call the number listed on your records 48-72 hours following your procedure to check on you and address any questions or concerns that you may have regarding the information given to you following your procedure. If we do not reach you, we will leave a message.  We will attempt to reach you two times.  During this call, we will ask if you have developed any symptoms of COVID 19. If you develop any symptoms (ie: fever, flu-like symptoms, shortness of breath, cough etc.) before then, please call [phone].  If you test positive for Covid 19 in the 2 weeks post procedure, please call and report this information to [name].    If any biopsies were taken you will be contacted by phone or by letter within the next 1-3 weeks.  Please call us at [phone] if you have not heard about the biopsies in 3 weeks.    SIGNATURES/CONFIDENTIALITY: You and/or your care partner have  signed paperwork which will be entered into your electronic medical record.  These signatures attest to the fact that that the information above on your After Visit Summary has been reviewed and is understood.  Full responsibility of the confidentiality of this discharge information lies with you and/or your care-partner.

## 2021-05-10 NOTE — Op Note (Signed)
[place] Patient Name: Joan Morales Procedure Date: 05/10/2021 1:48 PM MRN: [id number] Endoscopist: Milus Banister , MD Age: 81 Referring MD:  Date of Birth: 11-23-1940 Gender: Female Account #: [id number] Procedure:                Upper GI endoscopy Indications:              Generalized abdominal pain, anorexia, unintentional                            weight loss Medicines:                Monitored Anesthesia Care Procedure:                Pre-Anesthesia Assessment:                           - Prior to the procedure, a History and Physical                            was performed, and patient medications and                            allergies were reviewed. The patient's tolerance of                            previous anesthesia was also reviewed. The risks                            and benefits of the procedure and the sedation                            options and risks were discussed with the patient.                            All questions were answered, and informed consent                            was obtained. Prior Anticoagulants: The patient has                            taken no previous anticoagulant or antiplatelet                            agents. ASA Grade Assessment: III - A patient with                            severe systemic disease. After reviewing the risks                            and benefits, the patient was deemed in                            satisfactory condition to undergo the procedure.  After obtaining informed consent, the endoscope was                            passed under direct vision. Throughout the                            procedure, the patient's blood pressure, pulse, and                            oxygen saturations were monitored continuously. The                            Endoscope was introduced through the mouth, and                            advanced to the second part of duodenum.  The upper                            GI endoscopy was accomplished without difficulty.                            The patient tolerated the procedure well. Scope In: Scope Out: Findings:                 Mild inflammation characterized by erythema and                            friability was found in the gastric antrum.                            Biopsies were taken with a cold forceps for                            histology.                           The exam was otherwise without abnormality. Complications:            No immediate complications. Estimated blood loss:                            None. Estimated Blood Loss:     Estimated blood loss: none. Impression:               - Mild, non-specific gastritis..                           - The examination was otherwise normal. Recommendation:           - Patient has a contact number available for                            emergencies. The signs and symptoms of potential                            delayed complications were discussed with the  patient. Return to normal activities tomorrow.                            Written discharge instructions were provided to the                            patient.                           - Resume previous diet.                           - Continue present medications.                           - Await pathology results. If the biopsies are not                            helpful, the next step in workup will be CT scan                            abd/pelvis. Milus Banister, MD 05/10/2021 2:35:32 PM This report has been signed electronically.

## 2021-05-12 ENCOUNTER — Telehealth: Payer: Self-pay | Admitting: *Deleted

## 2021-05-12 NOTE — Telephone Encounter (Signed)
°  Follow up Call-  Call back number 05/10/2021  Post procedure Call Back phone  # [phone]  Permission to leave phone message Yes  Some recent data might be hidden     Patient questions: Message left to call if necessary.

## 2021-05-12 NOTE — Telephone Encounter (Signed)
°  Follow up Call-  Call back number 05/10/2021  Post procedure Call Back phone  # [phone]  Permission to leave phone message Yes  Some recent data might be hidden     Patient questions:  Do you have a fever, pain , or abdominal swelling? No. Pain Score  0 *  Have you tolerated food without any problems? Yes.    Have you been able to return to your normal activities? Yes.    Do you have any questions about your discharge instructions: Diet   No. Medications  No. Follow up visit  No.  Do you have questions or concerns about your Care? No.  Actions: * If pain score is 4 or above: No action needed, pain <4.  Have you developed a fever since your procedure? no  2.   Have you had an respiratory symptoms (SOB or cough) since your procedure? no  3.   Have you tested positive for COVID 19 since your procedure no  4.   Have you had any family members/close contacts diagnosed with the COVID 19 since your procedure?  no   If yes to any of these questions please route to Joylene John, RN and Joella Prince, RN

## 2021-05-16 ENCOUNTER — Other Ambulatory Visit: Payer: Self-pay

## 2021-05-16 DIAGNOSIS — R634 Abnormal weight loss: Secondary | ICD-10-CM

## 2021-05-16 DIAGNOSIS — F5000 Anorexia nervosa, unspecified: Secondary | ICD-10-CM

## 2021-05-18 ENCOUNTER — Encounter (INDEPENDENT_AMBULATORY_CARE_PROVIDER_SITE_OTHER): Payer: Medicare Other | Admitting: Ophthalmology

## 2021-05-18 ENCOUNTER — Other Ambulatory Visit: Payer: Self-pay

## 2021-05-18 DIAGNOSIS — H33301 Unspecified retinal break, right eye: Secondary | ICD-10-CM

## 2021-05-18 DIAGNOSIS — H35372 Puckering of macula, left eye: Secondary | ICD-10-CM | POA: Diagnosis not present

## 2021-05-18 DIAGNOSIS — H43813 Vitreous degeneration, bilateral: Secondary | ICD-10-CM | POA: Diagnosis not present

## 2021-05-18 DIAGNOSIS — H338 Other retinal detachments: Secondary | ICD-10-CM | POA: Diagnosis not present

## 2021-05-20 DIAGNOSIS — Z23 Encounter for immunization: Secondary | ICD-10-CM | POA: Diagnosis not present

## 2021-05-20 DIAGNOSIS — S82102D Unspecified fracture of upper end of left tibia, subsequent encounter for closed fracture with routine healing: Secondary | ICD-10-CM | POA: Diagnosis not present

## 2021-05-27 ENCOUNTER — Other Ambulatory Visit: Payer: Self-pay

## 2021-05-27 ENCOUNTER — Ambulatory Visit (HOSPITAL_COMMUNITY)
Admission: RE | Admit: 2021-05-27 | Discharge: 2021-05-27 | Disposition: A | Discharge status: Home / Self Care | Payer: Medicare Other | Source: Ambulatory Visit | Attending: Gastroenterology | Admitting: Gastroenterology

## 2021-05-27 DIAGNOSIS — N281 Cyst of kidney, acquired: Secondary | ICD-10-CM | POA: Diagnosis not present

## 2021-05-27 DIAGNOSIS — I700 Atherosclerosis of aorta: Secondary | ICD-10-CM | POA: Diagnosis not present

## 2021-05-27 DIAGNOSIS — F5000 Anorexia nervosa, unspecified: Secondary | ICD-10-CM

## 2021-05-27 DIAGNOSIS — R634 Abnormal weight loss: Secondary | ICD-10-CM | POA: Insufficient documentation

## 2021-05-27 DIAGNOSIS — K5730 Diverticulosis of large intestine without perforation or abscess without bleeding: Secondary | ICD-10-CM | POA: Diagnosis not present

## 2021-05-27 DIAGNOSIS — N8110 Cystocele, unspecified: Secondary | ICD-10-CM | POA: Diagnosis not present

## 2021-05-27 LAB — POCT I-STAT CREATININE: Creatinine, Ser: 0.8 mg/dL (ref 0.44–1.00)

## 2021-05-27 MED ORDER — SODIUM CHLORIDE (PF) 0.9 % IJ SOLN
INTRAMUSCULAR | Status: AC
  Filled 2021-05-27: qty 50

## 2021-05-27 MED ORDER — IOHEXOL 300 MG/ML  SOLN
80.0000 mL | Freq: Once | INTRAMUSCULAR | Status: AC | PRN
  Administered 2021-05-27: 80 mL via INTRAVENOUS

## 2021-05-31 ENCOUNTER — Other Ambulatory Visit: Payer: Self-pay

## 2021-05-31 DIAGNOSIS — R9389 Abnormal findings on diagnostic imaging of other specified body structures: Secondary | ICD-10-CM

## 2021-05-31 DIAGNOSIS — R634 Abnormal weight loss: Secondary | ICD-10-CM

## 2021-06-02 DIAGNOSIS — D0462 Carcinoma in situ of skin of left upper limb, including shoulder: Secondary | ICD-10-CM | POA: Diagnosis not present

## 2021-06-02 DIAGNOSIS — L820 Inflamed seborrheic keratosis: Secondary | ICD-10-CM | POA: Diagnosis not present

## 2021-07-12 DIAGNOSIS — M85851 Other specified disorders of bone density and structure, right thigh: Secondary | ICD-10-CM | POA: Diagnosis not present

## 2021-07-12 DIAGNOSIS — M85852 Other specified disorders of bone density and structure, left thigh: Secondary | ICD-10-CM | POA: Diagnosis not present

## 2021-07-12 DIAGNOSIS — Z780 Asymptomatic menopausal state: Secondary | ICD-10-CM | POA: Diagnosis not present

## 2021-07-12 LAB — HM DEXA SCAN

## 2021-07-18 DIAGNOSIS — H43813 Vitreous degeneration, bilateral: Secondary | ICD-10-CM | POA: Diagnosis not present

## 2021-07-18 DIAGNOSIS — H2511 Age-related nuclear cataract, right eye: Secondary | ICD-10-CM | POA: Diagnosis not present

## 2021-07-18 DIAGNOSIS — H401122 Primary open-angle glaucoma, left eye, moderate stage: Secondary | ICD-10-CM | POA: Diagnosis not present

## 2021-07-18 DIAGNOSIS — H04123 Dry eye syndrome of bilateral lacrimal glands: Secondary | ICD-10-CM | POA: Diagnosis not present

## 2021-07-18 DIAGNOSIS — Z961 Presence of intraocular lens: Secondary | ICD-10-CM | POA: Diagnosis not present

## 2021-07-18 DIAGNOSIS — H35372 Puckering of macula, left eye: Secondary | ICD-10-CM | POA: Diagnosis not present

## 2021-07-25 ENCOUNTER — Encounter: Payer: Self-pay | Admitting: *Deleted

## 2021-07-26 ENCOUNTER — Encounter: Payer: Self-pay | Admitting: Family Medicine

## 2021-07-27 ENCOUNTER — Encounter: Payer: Self-pay | Admitting: Pulmonary Disease

## 2021-07-27 ENCOUNTER — Ambulatory Visit (INDEPENDENT_AMBULATORY_CARE_PROVIDER_SITE_OTHER): Payer: Medicare Other | Admitting: Pulmonary Disease

## 2021-07-27 VITALS — BP 156/70 | HR 98 | Temp 98.3°F | Ht 68.0 in | Wt 150.0 lb

## 2021-07-27 DIAGNOSIS — R911 Solitary pulmonary nodule: Secondary | ICD-10-CM | POA: Diagnosis not present

## 2021-07-27 DIAGNOSIS — Z789 Other specified health status: Secondary | ICD-10-CM

## 2021-07-27 NOTE — Progress Notes (Signed)
? ?Synopsis: Referred in May 2023 for lung nodule by Milus Banister, MD ? ?Subjective:  ? ?PATIENT ID: Joan Morales GENDER: female DOB: 09-12-1940, MRN: [id number] ? ?Chief Complaint  ?Patient presents with  ? Consult  ?  Consult.   ? ? ?This is a 81 year old female, past medical history of GERD, osteopenia, ovarian teratoma.  No prior history of malignancy, lifelong non-smoker, some secondhand smoke exposure, no family history of lung cancer.  No family history of significant solid organ tumor.  She did have a maternal grandmother that had breast cancer.  She had a CT scan that was completed for abdomen and pelvis by her GI doctor that showed a new left lower lobe 4 mm lung nodule that was found incidentally she was referred here for evaluation. ? ? ?Past Medical History:  ?Diagnosis Date  ? Allergic rhinitis, cause unspecified   ? Arthritis   ? "hands" (03/10/2014)  ? Cataract   ? GERD (gastroesophageal reflux disease)   ? Metatarsal fracture 12/30/2020  ? right 5th  ? Osteopenia   ? fosamax started 01/2012  ? Ovarian teratoma   ? left, removed 12/06  ? PONV (postoperative nausea and vomiting)   ? Pure hypercholesterolemia   ? Shingles 03/2018  ? dx'd while in [facility], on right side of mouth, nose, lip  ? Tibial plateau fracture, left 12/30/2020  ?  ? ?Family History  ?Problem Relation Age of Onset  ? Heart disease Mother   ? COPD Father   ?     smoker  ? Fibromyalgia Sister   ? Irritable bowel syndrome Sister   ? Colitis Sister   ? Colon cancer Paternal Aunt 6  ? Diabetes Paternal Aunt   ? Cancer - Colon Paternal Aunt 93  ? Breast cancer Maternal Grandmother   ?     61's  ? Asthma Paternal Grandmother   ? Esophageal cancer Neg Hx   ? Stomach cancer Neg Hx   ?  ? ?Past Surgical History:  ?Procedure Laterality Date  ? CATARACT EXTRACTION Left 11/2015  ? EYE SURGERY    ? FRACTURE SURGERY    ? GAS INSERTION Left 03/10/2014  ? Procedure: INSERTION OF GAS-C3F8;  Surgeon: Hayden Pedro, MD;  Location: [place];   Service: Ophthalmology;  Laterality: Left;  ? LASER PHOTO ABLATION Left 03/10/2014  ? Procedure: LASER PHOTO ABLATION-HEADSCOPE LASER;  Surgeon: Hayden Pedro, MD;  Location: [place];  Service: Ophthalmology;  Laterality: Left;  ? SCLERAL BUCKLE Left 03/10/2014  ? SCLERAL BUCKLE WITH CRYO Left 03/10/2014  ? Procedure: SCLERAL BUCKLE WITH CRYO AND DIATHERMY;  Surgeon: Hayden Pedro, MD;  Location: [place];  Service: Ophthalmology;  Laterality: Left;  ? TONSILLECTOMY    ? TOTAL ABDOMINAL HYSTERECTOMY  02/2005  ? w/BSO; L ovarian teratoma  ? TOTAL ABDOMINAL HYSTERECTOMY W/ BILATERAL SALPINGOOPHORECTOMY  12/06  ? L ovarian teratoma  ? WRIST FRACTURE SURGERY Right 02/26/14  ? plate and screws  ? ? ?Social History  ? ?Socioeconomic History  ? Marital status: Married  ?  Spouse name: Not on file  ? Number of children: 1  ? Years of education: Not on file  ? Highest education level: Not on file  ?Occupational History  ? Occupation: retired  ?Tobacco Use  ? Smoking status: Never  ? Smokeless tobacco: Never  ?Vaping Use  ? Vaping Use: Never used  ?Substance and Sexual Activity  ? Alcohol use: No  ? Drug use: No  ?  Sexual activity: Not Currently  ?  Partners: Male  ?  Comment: husband with impotence issues  ?Other Topics Concern  ? Not on file  ?Social History Narrative  ? Married, lives with husband.  Son lives in [place], [place].  3 grandchildren.  Retired (2013; Engineer, materials).  ?   ? Reviewed/updated 04/2021  ? ?Social Determinants of Health  ? ?Financial Resource Strain: Not on file  ?Food Insecurity: Not on file  ?Transportation Needs: Not on file  ?Physical Activity: Not on file  ?Stress: Not on file  ?Social Connections: Not on file  ?Intimate Partner Violence: Not on file  ?  ? ?Allergies  ?Allergen Reactions  ? Erythromycin Swelling  ? Triple Antibiotic [Bacitracin-Neomycin-Polymyxin] Swelling  ?  Blistering  ?  ? ?Outpatient Medications Prior to Visit  ?Medication Sig Dispense Refill  ? albuterol (VENTOLIN HFA) 108  (90 Base) MCG/ACT inhaler Inhale 1-2 puffs into the lungs every 6 (six) hours as needed for wheezing or shortness of breath. 1 each 0  ? amoxicillin (AMOXIL) 875 MG tablet Take 1 tablet (875 mg total) by mouth 2 (two) times daily. (Patient not taking: Reported on 07/27/2021) 20 tablet 0  ? Ascorbic Acid (VITAMIN C) 1000 MG tablet Take 1,000 mg by mouth daily. (Patient not taking: Reported on 02/28/2021)    ? Melatonin 5 MG CAPS Take by mouth at bedtime. (Patient not taking: Reported on 07/27/2021)    ? ?No facility-administered medications prior to visit.  ? ? ?ROS ? ? ?Objective:  ?Physical Exam ? ? ?Vitals:  ? 07/27/21 1436  ?BP: (!) 156/70  ?Pulse: 98  ?Temp: 98.3 ?F (36.8 ?C)  ?TempSrc: Oral  ?SpO2: 98%  ?Weight: 150 lb (68 kg)  ?Height: '5\' 8"'$  (1.727 m)  ? ?98% on RA ?BMI Readings from Last 3 Encounters:  ?07/27/21 22.81 kg/m?  ?05/10/21 21.56 kg/m?  ?04/25/21 21.93 kg/m?  ? ?Wt Readings from Last 3 Encounters:  ?07/27/21 150 lb (68 kg)  ?05/10/21 146 lb (66.2 kg)  ?04/25/21 144 lb 3.2 oz (65.4 kg)  ? ? ? ?CBC ?   ?Component Value Date/Time  ? WBC 9.5 02/28/2021 1345  ? WBC 6.4 11/04/2014 0001  ? RBC 4.33 02/28/2021 1345  ? RBC 4.50 11/04/2014 0001  ? HGB 12.9 02/28/2021 1345  ? HCT 38.2 02/28/2021 1345  ? PLT 315 02/28/2021 1345  ? MCV 88 02/28/2021 1345  ? MCH 29.8 02/28/2021 1345  ? MCH 29.1 11/04/2014 0001  ? MCHC 33.8 02/28/2021 1345  ? MCHC 33.0 11/04/2014 0001  ? RDW 11.9 02/28/2021 1345  ? LYMPHSABS 2.1 02/28/2021 1345  ? MONOABS 0.6 11/04/2014 0001  ? EOSABS 0.1 02/28/2021 1345  ? BASOSABS 0.1 02/28/2021 1345  ? ? ? ?Chest Imaging: ?CT abdomen pelvis: ?4 mm left lower lobe pulmonary nodule ?The patient's images have been independently reviewed by me.   ? ?Pulmonary Functions Testing Results: ?   ? View : No data to display.  ?  ?  ?  ? ? ?FeNO:  ? ?Pathology:  ? ?Echocardiogram:  ? ?Heart Catheterization:  ?   ?Assessment & Plan:  ? ?  ICD-10-CM   ?1. Left lower lobe pulmonary nodule  R91.1   ?  ?2.  Non-smoker  Z78.9   ?  ? ? ?Discussion: ? ?This is a 81 year old female, lifelong non-smoker, no significant past medical history, no previous cancer, no family history of lung cancer. ? ?Plan: ?Patient is low risk lung nodule. ?This was found incidentally. ?No  additional follow-up needed for low risk patient. ?We talked about the pros and cons of additional follow-up at the age of 27. ?She does not qualify for lung cancer screening as she has been a non-smoker. ?We settled on no additional follow-up after discussing the risk and benefits of follow-up. ?Patient to return to [name] as needed. ? ? ? ?Current Outpatient Medications:  ?  albuterol (VENTOLIN HFA) 108 (90 Base) MCG/ACT inhaler, Inhale 1-2 puffs into the lungs every 6 (six) hours as needed for wheezing or shortness of breath., Disp: 1 each, Rfl: 0 ?  amoxicillin (AMOXIL) 875 MG tablet, Take 1 tablet (875 mg total) by mouth 2 (two) times daily. (Patient not taking: Reported on 07/27/2021), Disp: 20 tablet, Rfl: 0 ?  Ascorbic Acid (VITAMIN C) 1000 MG tablet, Take 1,000 mg by mouth daily. (Patient not taking: Reported on 02/28/2021), Disp: , Rfl:  ?  Melatonin 5 MG CAPS, Take by mouth at bedtime. (Patient not taking: Reported on 07/27/2021), Disp: , Rfl:  ? ? ?Garner Nash, DO ?[facility] ?07/27/2021 2:59 PM   ? ?

## 2021-07-27 NOTE — Patient Instructions (Addendum)
Thank you for visiting Dr. Remberto Lienhard at [facility]. Today we recommend the following:  Follow up with us as needed  Return if symptoms worsen or fail to improve.    Please do your part to reduce the spread of COVID-19.   

## 2021-11-09 ENCOUNTER — Encounter: Payer: Self-pay | Admitting: Family Medicine

## 2021-11-09 ENCOUNTER — Telehealth (INDEPENDENT_AMBULATORY_CARE_PROVIDER_SITE_OTHER): Payer: Medicare Other | Admitting: Family Medicine

## 2021-11-09 VITALS — BP 130/80 | Temp 98.5°F | Wt 148.0 lb

## 2021-11-09 DIAGNOSIS — R051 Acute cough: Secondary | ICD-10-CM | POA: Diagnosis not present

## 2021-11-09 DIAGNOSIS — J309 Allergic rhinitis, unspecified: Secondary | ICD-10-CM | POA: Diagnosis not present

## 2021-11-09 DIAGNOSIS — J45909 Unspecified asthma, uncomplicated: Secondary | ICD-10-CM | POA: Diagnosis not present

## 2021-11-09 MED ORDER — ALBUTEROL SULFATE HFA 108 (90 BASE) MCG/ACT IN AERS: 1.0000 | INHALATION_SPRAY | Freq: Four times a day (QID) | RESPIRATORY_TRACT | 0 refills | Status: AC | PRN
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
## 2021-11-09 NOTE — Patient Instructions (Signed)
Consider switching to a once-daily antihistamine (claritin, zyrtec or allegra), in place of the children's diphenhydramine.  You may continue the diphenhydramine, if that works the best.  Use it as directed, and with caution, as it can make you sleepy . You may use the Robitussin along with the diphenhydramine. I recommend this, as it can help more with the cough.  Drink plenty of water.  If you develop fever, sinus pain, worsening cough, or discolored mucus or phlegm, contact us, as this may indicate a bacterial infection.

## 2021-11-23 ENCOUNTER — Encounter: Payer: Self-pay | Admitting: Internal Medicine

## 2021-11-24 ENCOUNTER — Encounter: Payer: Self-pay | Admitting: Family Medicine

## 2021-11-24 ENCOUNTER — Telehealth (INDEPENDENT_AMBULATORY_CARE_PROVIDER_SITE_OTHER): Payer: Medicare Other | Admitting: Family Medicine

## 2021-11-24 VITALS — Temp 98.1°F | Ht 68.0 in | Wt 150.0 lb

## 2021-11-24 DIAGNOSIS — R052 Subacute cough: Secondary | ICD-10-CM | POA: Diagnosis not present

## 2021-11-24 DIAGNOSIS — J309 Allergic rhinitis, unspecified: Secondary | ICD-10-CM

## 2021-11-24 MED ORDER — BENZONATATE 200 MG PO CAPS: 200.0000 mg | ORAL_CAPSULE | Freq: Three times a day (TID) | ORAL | 0 refills | Status: DC | PRN

## 2021-11-24 NOTE — Patient Instructions (Signed)
Continue the Nasacort. Be sure to use gentle sniffs. It can take up to 10 days to see the full effect from this. You can continue the chlortrimeton along with this (or zyrtec). Mucinex DM 12 hour tablets--take 1 pill twice daily INSTEAD of Robitussin DM syrup, which wears off after 4 hours. Use the benzonatate if needed for cough. If your symptoms don't improve in the next 7-10 days, please come IN to the office for a visit so that I can perform a physical exam to get a better understanding of what is causing your symptoms. Currently it does truly still sound like allergies, and no need for x-rays or antibiotics. Continue to drink plenty of fluids.

## 2021-11-24 NOTE — Progress Notes (Signed)
Start time: 9:54 End time: 10:10  Virtual Visit via Video Note  I connected with Joan Morales on 11/24/21 by a video enabled telemedicine application and verified that I am speaking with the correct person using two identifiers.  Location: Patient: home Provider: office   I discussed the limitations of evaluation and management by telemedicine and the availability of in person appointments. The patient expressed understanding and agreed to proceed.  History of Present Illness:  Chief Complaint  Patient presents with   Cough    VIRTUAL cough and drainage x 2 weeks. No discolored mucus, fever or chills. No home covid tests.    She had a virtual visit on 8/23 with a cough that she though started as allergies, after having been outside a lot. Mucus was clear, and cough seemed to respond some to diphenhydramine, so felt allergies were indeed a factor.  She was advised to continue antihistamines, guaifenesin DM as needed. She has known RAD and inhaler was refilled.  She reports nothing has changed. She tried Zyrtec and Chlortrimeton to dry things up. She continues to have runny nose, mostly PND.  Mucus is clear.  Denies sinus headaches, sore throat. Cough is the same during the day and at night.  She coughs up phlegm more in the morning, but all day long, always clear.  Needed to use albuterol yesterday--denied wheezing or shortness of breath. It helped just a bit with her cough. She is using Robitussin DM every 4 hours (4x/day).  Cough woke her up last night, hasn't been waking her up other nights. Took chlortimeton 2-3 times yesterday, nothing yet today. Thought it worked better than the Zyrtec. She started using Nasacort 2-3 days ago, using it once daily.   PMH, PSH, SH reviewed   Outpatient Encounter Medications as of 11/24/2021  Medication Sig Note   albuterol (VENTOLIN HFA) 108 (90 Base) MCG/ACT inhaler Inhale 1-2 puffs into the lungs every 6 (six) hours as needed for wheezing  or shortness of breath. 11/24/2021: Used yesterday morning   Ascorbic Acid (VITAMIN C) 1000 MG tablet Take 1,000 mg by mouth daily.    cyanocobalamin (VITAMIN B12) 1000 MCG tablet Take 1,000 mcg by mouth daily.    Glucosamine-Vit D-Hyaluron Acd (GLUCOSAMINE PLUS VITAMIN D3 PO) Take 2 tablets by mouth daily. 11/24/2021: '2000mg'$  glucoasamine and 2000iuVit D3   guaiFENesin-dextromethorphan (ROBITUSSIN DM) 100-10 MG/5ML syrup Take 20 mLs by mouth every 4 (four) hours as needed for cough. 11/24/2021: Last dose this am   Omega-3 Fatty Acids (FISH OIL) 500 MG CAPS Take 1 capsule by mouth daily.    vitamin E 180 MG (400 UNITS) capsule Take 400 Units by mouth daily.    No facility-administered encounter medications on file as of 11/24/2021.   Allergies  Allergen Reactions   Erythromycin Swelling   Triple Antibiotic [Bacitracin-Neomycin-Polymyxin] Swelling    Blistering   ROS:  runny nose/PND and cough per HPI. No f/c, n/v/d, headaches, dizziness, chest pain. Denies shortness of breath. No bleeding/bruising/rashes   Observations/Objective:  Temp 98.1 F (36.7 C) (Temporal)   Ht '5\' 8"'$  (1.727 m)   Wt 150 lb (68 kg)   BMI 22.81 kg/m   Well-appearing, pleasant female in no distress She is alert and oriented, cranial nerves grossly normal. Conjunctiva and sclera are normal, EOMI Exam is limited due to virtual nature of the visit.  There as no coughing during visit. Patient was speaking comfortably.   Assessment and Plan:  Allergic rhinitis, unspecified seasonality, unspecified trigger - continue  Nasacort, proper use reviewed.  Cont antihistamine. Rec Mucinex DM 12 hour BID.  Subacute cough - Plan: benzonatate (TESSALON) 200 MG capsule  Continue the Nasacort. Be sure to use gentle sniffs. It can take up to 10 days to see the full effect from this. You can continue the chlortrimeton along with this (or zyrtec). Mucinex DM 12 hour tablets--take 1 pill twice daily INSTEAD of Robitussin DM syrup,  which wears off after 4 hours. Use the benzonatate if needed for cough. If your symptoms don't improve in the next 7-10 days, please come IN to the office for a visit so that I can perform a physical exam to get a better understanding of what is causing your symptoms. Currently it does truly still sound like allergies, and no need for x-rays or antibiotics. Continue to drink plenty of fluids.    Follow Up Instructions:    I discussed the assessment and treatment plan with the patient. The patient was provided an opportunity to ask questions and all were answered. The patient agreed with the plan and demonstrated an understanding of the instructions.   The patient was advised to call back or seek an in-person evaluation if the symptoms worsen or if the condition fails to improve as anticipated.  I spent 19 minutes dedicated to the care of this patient, including pre-visit review of records, face to face time, post-visit ordering of testing and documentation.    Vikki Ports, MD

## 2021-12-14 ENCOUNTER — Ambulatory Visit
Admission: RE | Admit: 2021-12-14 | Discharge: 2021-12-14 | Disposition: A | Discharge status: Home / Self Care | Payer: Medicare Other | Source: Ambulatory Visit | Attending: Physician Assistant | Admitting: Physician Assistant

## 2021-12-14 VITALS — BP 156/53 | HR 98 | Temp 98.2°F | Resp 19

## 2021-12-14 DIAGNOSIS — R051 Acute cough: Secondary | ICD-10-CM | POA: Diagnosis not present

## 2021-12-14 DIAGNOSIS — J069 Acute upper respiratory infection, unspecified: Secondary | ICD-10-CM | POA: Diagnosis not present

## 2021-12-14 DIAGNOSIS — J302 Other seasonal allergic rhinitis: Secondary | ICD-10-CM | POA: Diagnosis not present

## 2021-12-14 MED ORDER — PREDNISONE 10 MG PO TABS: 10.0000 mg | ORAL_TABLET | Freq: Three times a day (TID) | ORAL | 0 refills | Status: DC

## 2021-12-14 MED ORDER — MOMETASONE FUROATE 50 MCG/ACT NA SUSP: 2.0000 | Freq: Every day | NASAL | 12 refills | Status: DC

## 2021-12-14 MED ORDER — FEXOFENADINE HCL 60 MG PO TABS: 60.0000 mg | ORAL_TABLET | Freq: Two times a day (BID) | ORAL | 0 refills | Status: DC

## 2021-12-14 NOTE — ED Triage Notes (Signed)
Pt is present today with c/o productive cough and chest congestion. Pt states that cough has been going on for a few weeks.

## 2021-12-14 NOTE — Discharge Instructions (Signed)
Advised to continue to use Robitussin-DM as needed for cough. Advised using Nasonex nasal spray, 2 sprays each nostril once daily to help decrease the sinus congestion postnasal drip. Advised to use the Allegra twice daily to help control allergies. Advised to take the prednisone 10 mg 3 times a day for 3 days only to help reduce the inflammatory component of the allergies. Advised to follow-up PCP or return to urgent care if symptoms fail to improve.

## 2021-12-14 NOTE — ED Provider Notes (Signed)
[facility]    CSN: [id number] Arrival date & time: 12/14/21  1242      History   Chief Complaint Chief Complaint  Patient presents with   Cough    Productive cough, A lot of phlegm x1 month - Entered by patient    HPI Joan Morales is a 81 y.o. female.   And indicates for the past month she has been having some persistent upper respiratory congestion, nasal congestion with persistent postnasal drip.  He relates that she has been blowing out clear drainage.  She also has been having persistent cough, chest congestion, with clear production.  She relates she has been taking Robitussin, has used some Flonase, and Claritin without much relief of her symptoms.  She relates she does have seasonal allergies and she believes that these may have gotten aggravated when she has been working out in the yard with her plants.  She relates she has not have any fever, chills, shortness of breath, wheezing, or chest discomfort.   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   Osteopenia 01/04/2012    Past Surgical History:  Procedure Laterality Date   CATARACT EXTRACTION Left 11/2015   EYE SURGERY     FRACTURE SURGERY     GAS INSERTION Left 03/10/2014   Procedure: INSERTION OF [id number];  Surgeon: Hayden Pedro, MD;  Location: [place];  Service: Ophthalmology;  Laterality: Left;   LASER PHOTO  ABLATION Left 03/10/2014   Procedure: LASER PHOTO ABLATION-HEADSCOPE LASER;  Surgeon: Hayden Pedro, MD;  Location: [place];  Service: Ophthalmology;  Laterality: Left;   SCLERAL BUCKLE Left 03/10/2014   SCLERAL BUCKLE WITH CRYO Left 03/10/2014   Procedure: SCLERAL BUCKLE WITH CRYO AND DIATHERMY;  Surgeon: Hayden Pedro, MD;  Location: [place];  Service: Ophthalmology;  Laterality: Left;   TONSILLECTOMY     TOTAL ABDOMINAL HYSTERECTOMY  02/2005   w/BSO; L ovarian teratoma   TOTAL ABDOMINAL HYSTERECTOMY W/ BILATERAL SALPINGOOPHORECTOMY  12/06   L ovarian teratoma   WRIST FRACTURE SURGERY Right 02/26/14   plate and screws    OB History     Gravida  1   Para  1   Term      Preterm      AB      Living  1      SAB      IAB      Ectopic      Multiple      Live Births               Home Medications    Prior to Admission medications   Medication Sig Start Date End Date Taking? Authorizing Provider  fexofenadine (ALLEGRA) 60 MG tablet Take 1 tablet (60 mg total) by mouth 2 (two) times daily. 12/14/21  Yes  Nyoka Lint, PA-C  mometasone (NASONEX) 50 MCG/ACT nasal spray Place 2 sprays into the nose daily. 12/14/21  Yes Nyoka Lint, PA-C  predniSONE (DELTASONE) 10 MG tablet Take 1 tablet (10 mg total) by mouth 3 (three) times daily. 12/14/21  Yes Nyoka Lint, PA-C  albuterol (VENTOLIN HFA) 108 (90 Base) MCG/ACT inhaler Inhale 1-2 puffs into the lungs every 6 (six) hours as needed for wheezing or shortness of breath. 11/09/21   Rita Ohara, MD  Ascorbic Acid (VITAMIN C) 1000 MG tablet Take 1,000 mg by mouth daily.    [provider]  benzonatate (TESSALON) 200 MG capsule Take 1 capsule (200 mg total) by mouth 3 (three) times daily as needed. 11/24/21   Rita Ohara, MD  cyanocobalamin (VITAMIN B12) 1000 MCG tablet Take 1,000 mcg by mouth daily.    [provider]  Glucosamine-Vit D-Hyaluron Acd (GLUCOSAMINE PLUS VITAMIN D3 PO) Take 2 tablets by mouth daily.     [provider]  guaiFENesin-dextromethorphan (ROBITUSSIN DM) 100-10 MG/5ML syrup Take 20 mLs by mouth every 4 (four) hours as needed for cough.    [provider]  Omega-3 Fatty Acids (FISH OIL) 500 MG CAPS Take 1 capsule by mouth daily.    [provider]  triamcinolone (NASACORT ALLERGY 24HR) 55 MCG/ACT AERO nasal inhaler Place 2 sprays into the nose daily.    [provider]  vitamin E 180 MG (400 UNITS) capsule Take 400 Units by mouth daily.    [provider]    Family History Family History  Problem Relation Age of Onset   Heart disease Mother    COPD Father        smoker   Fibromyalgia Sister    Irritable bowel syndrome Sister    Colitis Sister    Colon cancer Paternal Aunt 98   Diabetes Paternal Aunt    Cancer - Colon Paternal Aunt 96   Breast cancer Maternal Grandmother        50's   Asthma Paternal Grandmother    Esophageal cancer Neg Hx    Stomach cancer Neg Hx     Social History Social History   Tobacco Use   Smoking status: Never   Smokeless tobacco: Never  Vaping Use   Vaping Use: Never used  Substance Use Topics   Alcohol use: No   Drug use: No     Allergies   Erythromycin and Triple antibiotic [bacitracin-neomycin-polymyxin]   Review of Systems Review of Systems  HENT:  Positive for postnasal drip and sinus pressure.   Respiratory:  Positive for cough.      Physical Exam Triage Vital Signs ED Triage Vitals [12/14/21 1254]  Enc Vitals Group     BP (!) 156/53     Pulse Rate 98     Resp 19     Temp 98.2 F (36.8 C)     Temp src      SpO2 95 %     Weight      Height      Head Circumference      Peak Flow      Pain Score 0     Pain Loc      Pain Edu?      Excl. in [place]?    No data found.  Updated Vital Signs BP (!) 156/53   Pulse 98   Temp 98.2 F (36.8 C)   Resp 19   SpO2 95%   Visual Acuity Right Eye Distance:   Left Eye  Distance:   Bilateral Distance:    Right Eye Near:    Left Eye Near:    Bilateral Near:     Physical Exam Constitutional:      Appearance: Normal appearance.  HENT:     Right Ear: Tympanic membrane and ear canal normal.     Left Ear: Tympanic membrane and ear canal normal.     Mouth/Throat:     Mouth: Mucous membranes are moist.     Pharynx: Oropharynx is clear. No posterior oropharyngeal erythema.  Cardiovascular:     Rate and Rhythm: Normal rate and regular rhythm.     Heart sounds: Normal heart sounds.  Pulmonary:     Effort: Pulmonary effort is normal.     Breath sounds: Normal breath sounds and air entry. No wheezing, rhonchi or rales.  Lymphadenopathy:     Cervical: No cervical adenopathy.  Neurological:     Mental Status: She is alert.      UC Treatments / Results  Labs (all labs ordered are listed, but only abnormal results are displayed) Labs Reviewed - No data to display  EKG   Radiology No results found.  Procedures Procedures (including critical care time)  Medications Ordered in UC Medications - No data to display  Initial Impression / Assessment and Plan / UC Course  I have reviewed the triage vital signs and the nursing notes.  Pertinent labs & imaging results that were available during my care of the patient were reviewed by me and considered in my medical decision making (see chart for details).       Plan: 1.  The acute upper respiratory and allergy will be treated with Nasonex nasal spray, 2 sprays each nostril once a day. 2.  The acute upper respiratory and allergy will be treated with Allegra twice daily to help control allergies and postnasal drip. 3.  The acute allergy symptoms will be treated with a prednisone 10 mg 3 times daily for 3 days only to help reduce the acute inflammatory reaction. 4.  Patient has been advised to continue taking Robitussin as needed for cough control. 5.  Patient advised to follow-up with PCP or return to urgent care if symptoms fail to improve. Final  Clinical Impressions(s) / UC Diagnoses   Final diagnoses:  Acute upper respiratory infection  Acute cough  Seasonal allergies     Discharge Instructions      Advised to continue to use Robitussin-DM as needed for cough. Advised using Nasonex nasal spray, 2 sprays each nostril once daily to help decrease the sinus congestion postnasal drip. Advised to use the Allegra twice daily to help control allergies. Advised to take the prednisone 10 mg 3 times a day for 3 days only to help reduce the inflammatory component of the allergies. Advised to follow-up PCP or return to urgent care if symptoms fail to improve.     ED Prescriptions     Medication Sig Dispense Auth. Provider   mometasone (NASONEX) 50 MCG/ACT nasal spray Place 2 sprays into the nose daily. 1 each Nyoka Lint, PA-C   fexofenadine (ALLEGRA) 60 MG tablet Take 1 tablet (60 mg total) by mouth 2 (two) times daily. 20 tablet Nyoka Lint, PA-C   predniSONE (DELTASONE) 10 MG tablet Take 1 tablet (10 mg total) by mouth 3 (three) times daily. 10 tablet Nyoka Lint, PA-C      PDMP not reviewed this encounter.   Nyoka Lint, PA-C 12/14/21 1311

## 2021-12-26 DIAGNOSIS — Z1231 Encounter for screening mammogram for malignant neoplasm of breast: Secondary | ICD-10-CM | POA: Diagnosis not present

## 2021-12-26 LAB — HM MAMMOGRAPHY

## 2021-12-27 ENCOUNTER — Encounter: Payer: Self-pay | Admitting: Internal Medicine

## 2022-01-19 DIAGNOSIS — Z23 Encounter for immunization: Secondary | ICD-10-CM | POA: Diagnosis not present

## 2022-01-24 DIAGNOSIS — H04123 Dry eye syndrome of bilateral lacrimal glands: Secondary | ICD-10-CM | POA: Diagnosis not present

## 2022-01-24 DIAGNOSIS — Z961 Presence of intraocular lens: Secondary | ICD-10-CM | POA: Diagnosis not present

## 2022-01-24 DIAGNOSIS — H35372 Puckering of macula, left eye: Secondary | ICD-10-CM | POA: Diagnosis not present

## 2022-01-24 DIAGNOSIS — H401122 Primary open-angle glaucoma, left eye, moderate stage: Secondary | ICD-10-CM | POA: Diagnosis not present

## 2022-01-24 DIAGNOSIS — H2511 Age-related nuclear cataract, right eye: Secondary | ICD-10-CM | POA: Diagnosis not present

## 2022-01-24 DIAGNOSIS — H43813 Vitreous degeneration, bilateral: Secondary | ICD-10-CM | POA: Diagnosis not present

## 2022-02-17 ENCOUNTER — Encounter (INDEPENDENT_AMBULATORY_CARE_PROVIDER_SITE_OTHER): Payer: Medicare Other | Admitting: Ophthalmology

## 2022-02-17 DIAGNOSIS — H43813 Vitreous degeneration, bilateral: Secondary | ICD-10-CM

## 2022-02-17 DIAGNOSIS — H35372 Puckering of macula, left eye: Secondary | ICD-10-CM

## 2022-02-17 DIAGNOSIS — H33301 Unspecified retinal break, right eye: Secondary | ICD-10-CM

## 2022-02-17 DIAGNOSIS — H338 Other retinal detachments: Secondary | ICD-10-CM | POA: Diagnosis not present

## 2022-02-20 ENCOUNTER — Telehealth (INDEPENDENT_AMBULATORY_CARE_PROVIDER_SITE_OTHER): Payer: Medicare Other | Admitting: Family Medicine

## 2022-02-20 ENCOUNTER — Encounter: Payer: Self-pay | Admitting: Family Medicine

## 2022-02-20 ENCOUNTER — Other Ambulatory Visit (INDEPENDENT_AMBULATORY_CARE_PROVIDER_SITE_OTHER): Payer: Medicare Other

## 2022-02-20 ENCOUNTER — Other Ambulatory Visit: Payer: Self-pay | Admitting: *Deleted

## 2022-02-20 VITALS — Temp 97.5°F | Ht 68.0 in | Wt 154.0 lb

## 2022-02-20 DIAGNOSIS — R509 Fever, unspecified: Secondary | ICD-10-CM | POA: Diagnosis not present

## 2022-02-20 DIAGNOSIS — R051 Acute cough: Secondary | ICD-10-CM

## 2022-02-20 DIAGNOSIS — J069 Acute upper respiratory infection, unspecified: Secondary | ICD-10-CM

## 2022-02-20 LAB — POC COVID19 BINAXNOW: SARS Coronavirus 2 Ag: NEGATIVE

## 2022-02-20 LAB — POCT INFLUENZA A/B
Influenza A, POC: NEGATIVE
Influenza B, POC: NEGATIVE

## 2022-02-20 NOTE — Progress Notes (Signed)
Start time: 3:26 End time: 3:41  Virtual Visit via Video Note  I connected with Joan Morales on 02/20/22 by a video enabled telemedicine application and verified that I am speaking with the correct person using two identifiers.  Location: Patient: home Provider: office   I discussed the limitations of evaluation and management by telemedicine and the availability of in person appointments. The patient expressed understanding and agreed to proceed.  History of Present Illness:  Chief Complaint  Patient presents with   Cough    VIRTUAL had 100.5 temp this am but no longer. Bad cough that started last week, end of the week. Fever was today, but she really had not taken temp other than today because she had not felt this badly. Has not done any home covid tests.    She has been sick off and on. She recalls coughing and drainage in October, it resolved, and started again.  Was feeling better, then developed a sore throat.  This has resolved.  Currently she has PND, cough and congestion. She has been feeling badly since the end of last week. Felt worse this morning, slightly woozy, and had a fever. Denies body aches.  Drainage is clear from her nose, and phlegm is also clear.  She has taken Robitussin DM, which had helped, but not much help last night. Denies wheezing, hasn't needed albuterol  +sick contacts. Son had a sinus infection, and daughter-in-law also felt bad. Nobody tested for COVID. They were together over Thanksgiving.   PMH, PSH, SH reviewed  Outpatient Encounter Medications as of 02/20/2022  Medication Sig Note   Ascorbic Acid (VITAMIN C) 1000 MG tablet Take 1,000 mg by mouth daily.    Calcium Carbonate (CALCIUM 600 PO) Take 1 tablet by mouth daily.    cyanocobalamin (VITAMIN B12) 1000 MCG tablet Take 1,000 mcg by mouth daily.    Glucosamine-Vit D-Hyaluron Acd (GLUCOSAMINE PLUS VITAMIN D3 PO) Take 2 tablets by mouth daily. 11/24/2021: '2000mg'$  glucoasamine and 2000iuVit D3    guaiFENesin-dextromethorphan (ROBITUSSIN DM) 100-10 MG/5ML syrup Take 20 mLs by mouth every 4 (four) hours as needed for cough. 11/24/2021: Last dose this am   mometasone (NASONEX) 50 MCG/ACT nasal spray Place 2 sprays into the nose daily.    Omega-3 Fatty Acids (FISH OIL) 500 MG CAPS Take 1 capsule by mouth daily.    vitamin E 180 MG (400 UNITS) capsule Take 400 Units by mouth daily.    [DISCONTINUED] fexofenadine (ALLEGRA) 60 MG tablet Take 1 tablet (60 mg total) by mouth 2 (two) times daily.    albuterol (VENTOLIN HFA) 108 (90 Base) MCG/ACT inhaler Inhale 1-2 puffs into the lungs every 6 (six) hours as needed for wheezing or shortness of breath. (Patient not taking: Reported on 02/20/2022) 02/20/2022: prn   benzonatate (TESSALON) 200 MG capsule Take 1 capsule (200 mg total) by mouth 3 (three) times daily as needed. (Patient not taking: Reported on 02/20/2022)    triamcinolone (NASACORT ALLERGY 24HR) 55 MCG/ACT AERO nasal inhaler Place 2 sprays into the nose daily. (Patient not taking: Reported on 02/20/2022) 02/20/2022: prn   [DISCONTINUED] predniSONE (DELTASONE) 10 MG tablet Take 1 tablet (10 mg total) by mouth 3 (three) times daily.    No facility-administered encounter medications on file as of 02/20/2022.   Allergies  Allergen Reactions   Erythromycin Swelling   Triple Antibiotic [Bacitracin-Neomycin-Polymyxin] Swelling    Blistering   ROS:  URI symptoms per HPI. No n/v/d, ear pain. No shortness of breath or chest pain.  Denies rashes, bruising, bleeding.    Observations/Objective:  Temp (!) 97.5 F (36.4 C) (Oral)   Ht '5\' 8"'$  (1.727 m)   Wt 154 lb (69.9 kg)   BMI 23.42 kg/m  100.3 on recheck just prior to visit.  Well-appearing, pleasant female, in no distress She is alert and oriented, and speaking comfortably. She had occasional wet-sounding cough during visit. Cranial nerves grossly intact. Normal mood, affect, grooming Exam is limited due to the virtual nature of the  visit.  Negative rapid COVID test, and influenza A&B  Assessment and Plan:  Viral upper respiratory illness - negative COVID and flu tests. Supportive measures reviewed. If persistent fever, or purulent drainage, needs ABX or in-person eval  We had discussed that if she tested + for COVID, would treat with antiviral (missed window for Tamiflu if + for flu). We discussed that if both tests are negative, this is likely another virus, and to contact us if mucus becomes more discolored, or if fevers persist, or other persistent/worsening symptoms.   Stay well hydrated. Either continue the robitussin DM every 4-6 hours, or switch to a 12 hour Mucinex DM tablet (taken twice daily). Use a medication such as claritin or Claritin D to help with the drainage and sinus pressure. Continue with sinus rinses, as needed for sinus pain. Contact us if your mucus becomes discolored (and stays discolored, yellow/green), or if you have persistent fever. Be in touch with [name] later this week if not improved.    Follow Up Instructions:    I discussed the assessment and treatment plan with the patient. The patient was provided an opportunity to ask questions and all were answered. The patient agreed with the plan and demonstrated an understanding of the instructions.   The patient was advised to call back or seek an in-person evaluation if the symptoms worsen or if the condition fails to improve as anticipated.  I spent 20 minutes dedicated to the care of this patient, including pre-visit review of records, face to face time, post-visit ordering of testing and documentation.    Vikki Ports, MD

## 2022-02-20 NOTE — Patient Instructions (Addendum)
  Stay well hydrated. Continue either the robitussin DM every 4-6 hours, or switch to a 12 hour Mucinex DM tablet (taken twice daily). Use a medication such as claritin or Claritin D to help with the drainage and sinus pressure. Continue with sinus rinses, as needed for sinus pain. Contact us if your mucus becomes discolored (and stays discolored, yellow/green), or if you have persistent fever. Be in touch with [name] later this week if not improved.

## 2022-02-23 ENCOUNTER — Ambulatory Visit (INDEPENDENT_AMBULATORY_CARE_PROVIDER_SITE_OTHER): Payer: Medicare Other | Admitting: Family Medicine

## 2022-02-23 ENCOUNTER — Encounter: Payer: Self-pay | Admitting: Family Medicine

## 2022-02-23 ENCOUNTER — Ambulatory Visit
Admission: RE | Admit: 2022-02-23 | Discharge: 2022-02-23 | Disposition: A | Discharge status: Home / Self Care | Payer: Medicare Other | Source: Ambulatory Visit | Attending: Family Medicine | Admitting: Family Medicine

## 2022-02-23 VITALS — BP 118/60 | HR 80 | Temp 98.7°F | Ht 68.0 in | Wt 155.0 lb

## 2022-02-23 DIAGNOSIS — J189 Pneumonia, unspecified organism: Secondary | ICD-10-CM

## 2022-02-23 DIAGNOSIS — R509 Fever, unspecified: Secondary | ICD-10-CM | POA: Diagnosis not present

## 2022-02-23 DIAGNOSIS — J90 Pleural effusion, not elsewhere classified: Secondary | ICD-10-CM

## 2022-02-23 DIAGNOSIS — R051 Acute cough: Secondary | ICD-10-CM

## 2022-02-23 DIAGNOSIS — R0789 Other chest pain: Secondary | ICD-10-CM | POA: Diagnosis not present

## 2022-02-23 DIAGNOSIS — R059 Cough, unspecified: Secondary | ICD-10-CM | POA: Diagnosis not present

## 2022-02-23 MED ORDER — LEVOFLOXACIN 750 MG PO TABS: 750.0000 mg | ORAL_TABLET | Freq: Every day | ORAL | 0 refills | Status: DC

## 2022-02-23 NOTE — Progress Notes (Signed)
Chief Complaint  Patient presents with   Cough    Seen for virtual Monday. Still has cough, lot of mucus that is white in color that she brings up when she coughs. Has pain right side at base of ribs when she is laying in bed at night, does not really bother her when she is standing or during the day very much.    Patient presents with ongoing respiratory symptoms, fever, and now with R sided chest wall pain. She switched to taking Mucinex Fast Max in place of Robitussin DM (has same ingredients). She finds this helpful--dries up the phlegm, just is short-acting, so has recurrent symptoms.  She reports some intermittent fevers, up to 101 yesterday. Last dose of ibuprofen was last night (for side pain), around 2am.  2 nights ago she started with a slight pain at R side/flank, which was worse last night. No pain during the day. She has slight discomfort in that area with deep breaths.  Cough is unchanged. Doesn't hurt to cough. She used albuterol twice--to see if it would help, not feeling particularly tight or short of breath. She thinks it helped some. Denies DOE.  PMH, PSH, SH reviewed  Outpatient Encounter Medications as of 02/23/2022  Medication Sig Note   albuterol (VENTOLIN HFA) 108 (90 Base) MCG/ACT inhaler Inhale 1-2 puffs into the lungs every 6 (six) hours as needed for wheezing or shortness of breath. 02/23/2022: Used twice yesterday   Ascorbic Acid (VITAMIN C) 1000 MG tablet Take 1,000 mg by mouth daily.    Calcium Carbonate (CALCIUM 600 PO) Take 1 tablet by mouth daily.    cyanocobalamin (VITAMIN B12) 1000 MCG tablet Take 1,000 mcg by mouth daily.    Dextromethorphan-guaiFENesin (MUCINEX FAST-MAX DM MAX PO) Take 20 mLs by mouth as needed. 02/23/2022: Last dose 9:30am   Glucosamine-Vit D-Hyaluron Acd (GLUCOSAMINE PLUS VITAMIN D3 PO) Take 2 tablets by mouth daily. 11/24/2021: '2000mg'$  glucoasamine and 2000iuVit D3   mometasone (NASONEX) 50 MCG/ACT nasal spray Place 2 sprays into the  nose daily.    Omega-3 Fatty Acids (FISH OIL) 500 MG CAPS Take 1 capsule by mouth daily.    vitamin E 180 MG (400 UNITS) capsule Take 400 Units by mouth daily.    benzonatate (TESSALON) 200 MG capsule Take 1 capsule (200 mg total) by mouth 3 (three) times daily as needed. (Patient not taking: Reported on 02/20/2022)    guaiFENesin-dextromethorphan (ROBITUSSIN DM) 100-10 MG/5ML syrup Take 20 mLs by mouth every 4 (four) hours as needed for cough. (Patient not taking: Reported on 02/23/2022)    triamcinolone (NASACORT ALLERGY 24HR) 55 MCG/ACT AERO nasal inhaler Place 2 sprays into the nose daily. (Patient not taking: Reported on 02/20/2022) 02/20/2022: prn   No facility-administered encounter medications on file as of 02/23/2022.   Allergies  Allergen Reactions   Erythromycin Swelling   Triple Antibiotic [Bacitracin-Neomycin-Polymyxin] Swelling    Blistering   ROS: fever and cough per HPI.  Right sided CP per HPI. She vomited Monday after her visit (just once), and has had diarrhea several times since then. Imodium helps. No ongoing nausea. Denies abdominal pain. No blood in stool   PHYSICAL EXAM:  BP 118/60   Pulse 80   Temp 98.7 F (37.1 C) (Tympanic)   Ht '5\' 8"'$  (1.727 m)   Wt 155 lb (70.3 kg)   BMI 23.57 kg/m   Mildly ill appearing female. Coughing during visit. Hoarse voice HEENT: conjunctiva and sclera are clear, EOMI. Nasal mucosa is mildly edematous, area  of recent bleeding noted at the L septum, and clear mucus is present on the left. Sinuses nontender OP clear Lungs--clear, without wheezes, rales or ronchi. Slightly decreased BS noted on the right compared to left Back: no spinal or CVA tenderness She is has a small area of focal tenderness at the right lateral chest wall, overlying the ribs. Extremities: no edema Neuro: alert and oriented, cranial nerves grossly intact. Normal gait. Psych: normal mood, hygiene, grooming   ASSESSMENT/PLAN:  Community acquired pneumonia  of right lung, unspecified part of lung - pt with fever, abnl CXR and pleural effusion (likely related to pna). Start ABX, and to f/u in 7-10d, sooner if worse - Plan: levofloxacin (LEVAQUIN) 750 MG tablet  Fever, unspecified fever cause - Plan: DG Chest 2 View  Acute cough - Plan: DG Chest 2 View  Right-sided chest wall pain - Ddx reviewed at visit with pt--MSK vs PNA. There is a reproducible component. Decreased air movement noted on R.  Moist heat, NSAID - Plan: DG Chest 2 View  Pleural effusion - suspect related to pneumonia  [facility] for a chest x-ray. You should get these results today. If it shows a pneumonia, we will send in antibiotics to your pharmacy. If it is normal, then your pain is likely from the muscles and the coughing. Moist heat can help. You can try topical medications (Biofreeze, Salonpas with lidocaine, Icy Hot, etc). You can continue to take ibuprofen up to 3 pills at a time, WITH FOOD, three to four times daily, if needed.  Cut back on the dose if it bothers your stomach. You may also use acetaminophen (tylenol) if needed. You may want to take a claritin or allegra or zyrtec to also help with the nasal drainage. You may continue with the FastMax DM  If there is no infection on the x-ray, I'm still thinking this is viral (especially with the diarrhea, and that your mucus still looks clear). If you have persistent fever, or if/when your mucus turns yellow, please contact us.  ADDENDUM: CXR: IMPRESSION: 1. New small right pleural effusion. 2. Right-greater-than-left basilar heterogeneous airspace opacities suspicious for pneumonia. 3. Hyperinflation and emphysematous change. 4. Moderate biapical pleural thickening/scarring, unchanged.  Given allergy to erythromycin (swelling, not GI), will use levaquin for broad coverage

## 2022-02-23 NOTE — Patient Instructions (Signed)
  Go to [facility] ([address]) for a chest x-ray. You should get these results today. If it shows a pneumonia, we will send in antibiotics to your pharmacy. If it is normal, then your pain is likely from the muscles and the coughing. Moist heat can help. You can try topical medications (Biofreeze, Salonpas with lidocaine, Icy Hot, etc). You can continue to take ibuprofen up to 3 pills at a time, WITH FOOD, three to four times daily, if needed.  Cut back on the dose if it bothers your stomach. You may also use acetaminophen (tylenol) if needed. You may want to take a claritin or allegra or zyrtec to also help with the nasal drainage. You may continue with the FastMax DM  If there is no infection on the x-ray, I'm still thinking this is viral (especially with the diarrhea, and that your mucus still looks clear). If you have persistent fever, or if/when your mucus turns yellow, please contact us.

## 2022-03-05 NOTE — Progress Notes (Unsigned)
No chief complaint on file.   Patient presents for follow-up on pneumonia.  She was last seen 12/7 with R side pain, fever, cough. CXR showed: IMPRESSION: 1. New small right pleural effusion. 2. Right-greater-than-left basilar heterogeneous airspace opacities suspicious for pneumonia. 3. Hyperinflation and emphysematous change. 4. Moderate biapical pleural thickening/scarring, unchanged.   She was treated with levaquin '750mg'$  daily x 7 days (allergy to erythro, causing swelling, not GI SE).  Today she reports   PMH, PSH, SH reviewed   ROS: no fever, chills, dizziness, syncope, headaches. No n/v/d. URI symptoms Cough R sided chest pain    PHYSICAL EXAM:  There were no vitals taken for this visit.  Wt Readings from Last 3 Encounters:  02/23/22 155 lb (70.3 kg)  02/20/22 154 lb (69.9 kg)  11/24/21 150 lb (68 kg)     ASSESSMENT/PLAN:

## 2022-03-06 ENCOUNTER — Ambulatory Visit (INDEPENDENT_AMBULATORY_CARE_PROVIDER_SITE_OTHER): Payer: Medicare Other | Admitting: Family Medicine

## 2022-03-06 ENCOUNTER — Encounter: Payer: Self-pay | Admitting: Family Medicine

## 2022-03-06 VITALS — BP 132/78 | HR 76 | Temp 97.9°F | Ht 68.0 in | Wt 150.8 lb

## 2022-03-06 DIAGNOSIS — J90 Pleural effusion, not elsewhere classified: Secondary | ICD-10-CM | POA: Diagnosis not present

## 2022-03-06 DIAGNOSIS — J189 Pneumonia, unspecified organism: Secondary | ICD-10-CM | POA: Diagnosis not present

## 2022-03-06 NOTE — Patient Instructions (Signed)
  Clinically, your pneumonia has resolved with the antibiotics.  No further fever, cough is significantly better, and your lungs sound better.  There might still be just a little bit of fluid at the right base, which should clear with time.  You need to go back to [place] for a follow up chest x-ray in another 4-6 weeks (sooner if you feel worse again), to ensure that everything has cleared up. (This clearing lags behind how you feel, so we usually wait 6-8 weeks from diagnosis to recheck).

## 2022-03-17 ENCOUNTER — Ambulatory Visit
Admission: RE | Admit: 2022-03-17 | Discharge: 2022-03-17 | Disposition: A | Discharge status: Home / Self Care | Payer: Medicare Other | Source: Ambulatory Visit | Attending: Physician Assistant | Admitting: Physician Assistant

## 2022-03-17 VITALS — BP 159/74 | HR 79 | Temp 98.1°F | Resp 19

## 2022-03-17 DIAGNOSIS — J209 Acute bronchitis, unspecified: Secondary | ICD-10-CM | POA: Diagnosis not present

## 2022-03-17 DIAGNOSIS — R0981 Nasal congestion: Secondary | ICD-10-CM | POA: Diagnosis not present

## 2022-03-17 DIAGNOSIS — J069 Acute upper respiratory infection, unspecified: Secondary | ICD-10-CM

## 2022-03-17 MED ORDER — PREDNISONE 10 MG PO TABS: 10.0000 mg | ORAL_TABLET | Freq: Three times a day (TID) | ORAL | 0 refills | Status: DC

## 2022-03-17 MED ORDER — MOMETASONE FUROATE 50 MCG/ACT NA SUSP: 2.0000 | Freq: Every day | NASAL | 12 refills | Status: DC

## 2022-03-17 NOTE — ED Triage Notes (Signed)
[facility] with [facility] of cough and congestion since August. Pt reports she was diagnosed with pneumonia 12/7 and was given medications but symptoms continue.

## 2022-03-17 NOTE — Discharge Instructions (Signed)
Advised to use the albuterol inhaler, 2 puffs every 6 hours on a regular basis for the next 7 days to decrease chest congestion and cough. Advised to continue use Mucinex DM every 12 hours to control chest congestion and cough. Advised to use the Nasonex nasal spray, 2 sprays each nostril once daily to help reduce the sinus congestion and drainage. Advised take prednisone 10 mg 3 times a day for 6 days only in order to help reduce the sinus and the respiratory inflammatory process. Advised follow-up PCP or return to urgent care if symptoms fail to improve.

## 2022-03-17 NOTE — ED Provider Notes (Signed)
[facility]    CSN: [id number] Arrival date & time: 03/17/22  1057      History   Chief Complaint Chief Complaint  Patient presents with   Cough   Nasal Congestion    HPI Joan Morales is a 81 y.o. female.   81 year old female presents with sinus congestion, chest congestion and cough.  Patient indicates that since August of this year she has been having persistent upper respiratory sinus congestion with frontal and maxillary sinus pressure persistent rhinitis that is mainly thick and clear to yellow with postnasal drip.  Patient indicates she just is producing a lot of sinus congestion.  She indicates that she is also having chest congestion with recurrent cough with production being thick and yellow.  Patient indicates she still is having a lot of chest rattling and chest congestion with a cough.  She denies having fever, chills, body aches or pain, wheezing or shortness of breath.  Patient indicates that she was diagnosed with pneumonia on 02/23/2022 and she has taken a course of Levaquin to treat the pneumonia.  Patient indicates she does have a follow-up with Dr. Tomi Bamberger to have a repeat chest x-ray March 26, 2022.  Patient indicates that she just continues to have chest congestion and cough and has tried several different medications without improvement of her symptoms.  Patient denies nausea or vomiting.   Cough Associated symptoms: rhinorrhea     Past Medical History:  Diagnosis Date   Allergic rhinitis, cause unspecified    Arthritis    "hands" (03/10/2014)   Cataract    GERD (gastroesophageal reflux disease)    Metatarsal fracture 12/30/2020   right 5th   Osteopenia    fosamax started 01/2012   Ovarian teratoma    left, removed 12/06   PONV (postoperative nausea and vomiting)    Pure hypercholesterolemia    Shingles 03/2018   dx'd while in [facility], on right side of mouth, nose, lip   Tibial plateau fracture, left 12/30/2020    Patient Active  Problem List   Diagnosis Date Noted   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 03/09/2014   Osteopenia 01/04/2012    Past Surgical History:  Procedure Laterality Date   CATARACT EXTRACTION Left 11/2015   EYE SURGERY     FRACTURE SURGERY     GAS INSERTION Left 03/10/2014   Procedure: INSERTION OF [id number];  Surgeon: Hayden Pedro, MD;  Location: [place];  Service: Ophthalmology;  Laterality: Left;   LASER PHOTO ABLATION Left 03/10/2014   Procedure: LASER PHOTO ABLATION-HEADSCOPE LASER;  Surgeon: Hayden Pedro, MD;  Location: [place];  Service: Ophthalmology;  Laterality: Left;   SCLERAL BUCKLE Left 03/10/2014   SCLERAL BUCKLE WITH CRYO Left 03/10/2014   Procedure: SCLERAL BUCKLE WITH CRYO AND DIATHERMY;  Surgeon: Hayden Pedro, MD;  Location: [place];  Service: Ophthalmology;  Laterality: Left;   TONSILLECTOMY     TOTAL ABDOMINAL HYSTERECTOMY  02/2005   w/BSO; L ovarian teratoma   TOTAL ABDOMINAL HYSTERECTOMY W/ BILATERAL SALPINGOOPHORECTOMY  12/06   L ovarian teratoma   WRIST FRACTURE SURGERY Right 02/26/14   plate and screws    OB History     Gravida  1   Para  1   Term      Preterm      AB      Living  1      SAB      IAB      Ectopic  Multiple      Live Births               Home Medications    Prior to Admission medications   Medication Sig Start Date End Date Taking? Authorizing Provider  predniSONE (DELTASONE) 10 MG tablet Take 1 tablet (10 mg total) by mouth in the morning, at noon, and at bedtime. 03/17/22  Yes Nyoka Lint, PA-C  albuterol (VENTOLIN HFA) 108 (90 Base) MCG/ACT inhaler Inhale 1-2 puffs into the lungs every 6 (six) hours as needed for wheezing or shortness of breath. Patient not taking: Reported on 03/06/2022 11/09/21   Rita Ohara, MD  Ascorbic Acid (VITAMIN C) 1000 MG tablet Take 1,000 mg by mouth daily.    [provider]  Calcium Carbonate (CALCIUM 600 PO) Take 1 tablet by mouth daily.    [provider]  cyanocobalamin (VITAMIN B12) 1000 MCG tablet Take 1,000 mcg by mouth daily.    [provider]  Dextromethorphan-guaiFENesin ([place] FAST-MAX DM MAX PO) Take 20 mLs by mouth as needed.    [provider]  Glucosamine-Vit D-Hyaluron Acd (GLUCOSAMINE PLUS VITAMIN D3 PO) Take 2 tablets by mouth daily.    [provider]  guaiFENesin-dextromethorphan (ROBITUSSIN DM) 100-10 MG/5ML syrup Take 20 mLs by mouth every 4 (four) hours as needed for cough. Patient not taking: Reported on 02/23/2022    [provider]  mometasone (NASONEX) 50 MCG/ACT nasal spray Place 2 sprays into the nose daily. 03/17/22   Nyoka Lint, PA-C  Omega-3 Fatty Acids (FISH OIL) 500 MG CAPS Take 1 capsule by mouth daily.    [provider]  triamcinolone (NASACORT ALLERGY 24HR) 55 MCG/ACT AERO nasal inhaler Place 2 sprays into the nose daily. Patient not taking: Reported on 02/20/2022    [provider]  vitamin E 180 MG (400 UNITS) capsule Take 400 Units by mouth daily.    [provider]    Family History Family History  Problem Relation Age of Onset   Heart disease Mother    COPD Father        smoker   Fibromyalgia Sister    Irritable bowel syndrome Sister    Colitis Sister    Colon cancer Paternal Aunt 98   Diabetes Paternal Aunt    Cancer - Colon Paternal Aunt 96   Breast cancer Maternal Grandmother        50's   Asthma Paternal Grandmother    Esophageal cancer Neg Hx    Stomach cancer Neg Hx     Social History Social History   Tobacco Use   Smoking status: Never   Smokeless tobacco: Never  Vaping Use   Vaping Use: Never used  Substance Use Topics   Alcohol use: No   Drug use: No     Allergies   Erythromycin and Triple antibiotic [bacitracin-neomycin-polymyxin]   Review of Systems Review of Systems  HENT:  Positive for postnasal drip, rhinorrhea and sinus pain.   Respiratory:  Positive for cough.      Physical  Exam Triage Vital Signs ED Triage Vitals [03/17/22 1141]  Enc Vitals Group     BP (!) 159/74     Pulse Rate 79     Resp 19     Temp 98.1 F (36.7 C)     Temp src      SpO2 93 %     Weight      Height      Head Circumference  Peak Flow      Pain Score 0     Pain Loc      Pain Edu?      Excl. in [place]?    No data found.  Updated Vital Signs BP (!) 159/74   Pulse 79   Temp 98.1 F (36.7 C)   Resp 19   SpO2 93%   Visual Acuity Right Eye Distance:   Left Eye Distance:   Bilateral Distance:    Right Eye Near:   Left Eye Near:    Bilateral Near:     Physical Exam Constitutional:      Appearance: Normal appearance.  HENT:     Right Ear: Ear canal normal. Tympanic membrane is injected.     Left Ear: Ear canal normal. Tympanic membrane is injected.     Mouth/Throat:     Mouth: Mucous membranes are moist.     Pharynx: Oropharynx is clear. No posterior oropharyngeal erythema.  Cardiovascular:     Rate and Rhythm: Normal rate and regular rhythm.     Heart sounds: Normal heart sounds.  Pulmonary:     Effort: Pulmonary effort is normal.     Breath sounds: Normal air entry. Examination of the right-lower field reveals rhonchi. Examination of the left-lower field reveals rhonchi. Rhonchi (moderate bilat) present. No wheezing or rales.  Lymphadenopathy:     Cervical: No cervical adenopathy.  Neurological:     Mental Status: She is alert.      UC Treatments / Results  Labs (all labs ordered are listed, but only abnormal results are displayed) Labs Reviewed - No data to display  EKG   Radiology No results found.  Procedures Procedures (including critical care time)  Medications Ordered in UC Medications - No data to display  Initial Impression / Assessment and Plan / UC Course  I have reviewed the triage vital signs and the nursing notes.  Pertinent labs & imaging results that were available during my care of the patient were reviewed by me and  considered in my medical decision making (see chart for details).    Plan: 1.  The acute upper respiratory tract infection will be treated with the following: A.  Nasonex nasal spray, 2 sprays each nostril once daily. 2.  The sinus congestion will be treated with the following: A.  Nasonex nasal spray, 2 sprays each nostril once daily. B.  Prednisone 10 mg, every 8 hours for the next 6 days to reduce the sinus congestion. 3.  The acute bronchitis will be treated with the following: A.  Prednisone 10 mg 3 times a day for 6 days only to reduce the respiratory inflammatory process. B.  Albuterol inhaler, 2 puffs every 6 hours on a regular basis for the next 7 days to reduce cough and chest congestion. 4.  Patient advised to use Mucinex DM to help control her cough. 5.  Patient advised to follow-up with Dr. Tomi Bamberger around her scheduled appointment around March 26, 2022 or return to urgent care as needed. Final Clinical Impressions(s) / UC Diagnoses   Final diagnoses:  Acute upper respiratory infection  Sinus congestion  Acute bronchitis, unspecified organism     Discharge Instructions      Advised to use the albuterol inhaler, 2 puffs every 6 hours on a regular basis for the next 7 days to decrease chest congestion and cough. Advised to continue use Mucinex DM every 12 hours to control chest congestion and cough. Advised to use the Nasonex nasal spray,  2 sprays each nostril once daily to help reduce the sinus congestion and drainage. Advised take prednisone 10 mg 3 times a day for 6 days only in order to help reduce the sinus and the respiratory inflammatory process. Advised follow-up PCP or return to urgent care if symptoms fail to improve.    ED Prescriptions     Medication Sig Dispense Auth. Provider   mometasone (NASONEX) 50 MCG/ACT nasal spray Place 2 sprays into the nose daily. 1 each Nyoka Lint, PA-C   predniSONE (DELTASONE) 10 MG tablet Take 1 tablet (10 mg total) by mouth  in the morning, at noon, and at bedtime. 18 tablet Nyoka Lint, PA-C      PDMP not reviewed this encounter.   Nyoka Lint, PA-C 03/17/22 1202

## 2022-03-28 ENCOUNTER — Encounter: Payer: Self-pay | Admitting: Internal Medicine

## 2022-03-29 ENCOUNTER — Ambulatory Visit
Admission: RE | Admit: 2022-03-29 | Discharge: 2022-03-29 | Disposition: A | Discharge status: Home / Self Care | Payer: Medicare Other | Source: Ambulatory Visit | Attending: Family Medicine | Admitting: Family Medicine

## 2022-03-29 ENCOUNTER — Encounter: Payer: Self-pay | Admitting: Family Medicine

## 2022-03-29 ENCOUNTER — Ambulatory Visit (INDEPENDENT_AMBULATORY_CARE_PROVIDER_SITE_OTHER): Payer: Medicare Other | Admitting: Family Medicine

## 2022-03-29 VITALS — BP 120/74 | HR 84 | Temp 98.4°F | Ht 68.0 in | Wt 148.2 lb

## 2022-03-29 DIAGNOSIS — R058 Other specified cough: Secondary | ICD-10-CM

## 2022-03-29 DIAGNOSIS — J189 Pneumonia, unspecified organism: Secondary | ICD-10-CM

## 2022-03-29 DIAGNOSIS — J90 Pleural effusion, not elsewhere classified: Secondary | ICD-10-CM | POA: Diagnosis not present

## 2022-03-29 MED ORDER — DOXYCYCLINE HYCLATE 100 MG PO TABS: 100.0000 mg | ORAL_TABLET | Freq: Two times a day (BID) | ORAL | 0 refills | Status: DC

## 2022-03-29 NOTE — Patient Instructions (Addendum)
Go to [facility] for the chest x-ray that was previously ordered. Start doxycycline to cover for sinus infection and bronchitis. (Doesn't seem like there is a sinus infection, based on symptoms sounds more like bronchitis; the x-ray will let us know if the pneumonia has resolved or if there are any new lung findings). Take it twice daily for 10 days.  Continue the mucinex DM, and stay well hydrated.

## 2022-03-29 NOTE — Progress Notes (Signed)
Chief Complaint  Patient presents with   Follow-up    Still coughing, bringing up thick yellow mucus. Still feeling tired.    Patient presents with complaint of cough.  She was seen 12/29 at Urgent care with congestion, cough. She was treated with prednisone '10mg'$  TID x 6 days, nasonex and albuterol.  No antibiotic was prescribed.   She completed the prednisone Friday.  Didn't notice any improvement in her congestion or cough. She is using Nasonex regularly--slight improvement in head congestion. Nasal drainage has been clear.  She started using some sinus rinses, got out "a mess"--and things have felt better since then. She still has some chest congestion--getting up thick yellow phlegm. Same as when she went to UC, no improvement or worsening. Cough doesn't keep her up at night.  Using Mucinex DM 12 hour, nasonex Using albuterol at least 2-3x/day, which helps some.  She had pneumonia earlier in December (treated with levaquin), due for f/u CXR. She never got completely better, has had some ongoing cough, but also with nasal drainage and head congestion.  She had T99.9 last night.  No other known fevers.  PMH, PSH, SH reviewed  Outpatient Encounter Medications as of 03/29/2022  Medication Sig Note   albuterol (VENTOLIN HFA) 108 (90 Base) MCG/ACT inhaler Inhale 1-2 puffs into the lungs every 6 (six) hours as needed for wheezing or shortness of breath. 03/29/2022: Using daily right now, about 2-3 times a day   Ascorbic Acid (VITAMIN C) 1000 MG tablet Take 1,000 mg by mouth daily.    Calcium Carbonate (CALCIUM 600 PO) Take 1 tablet by mouth daily.    cyanocobalamin (VITAMIN B12) 1000 MCG tablet Take 1,000 mcg by mouth daily.    dextromethorphan-guaiFENesin (MUCINEX DM) 30-600 MG 12hr tablet Take 1 tablet by mouth 2 (two) times daily.    Glucosamine-Vit D-Hyaluron Acd (GLUCOSAMINE PLUS VITAMIN D3 PO) Take 2 tablets by mouth daily. 11/24/2021: '2000mg'$  glucoasamine and 2000iuVit D3   mometasone  (NASONEX) 50 MCG/ACT nasal spray Place 2 sprays into the nose daily.    Omega-3 Fatty Acids (FISH OIL) 500 MG CAPS Take 1 capsule by mouth daily.    vitamin E 180 MG (400 UNITS) capsule Take 400 Units by mouth daily.    [DISCONTINUED] triamcinolone (NASACORT ALLERGY 24HR) 55 MCG/ACT AERO nasal inhaler Place 2 sprays into the nose daily. 02/20/2022: prn   [DISCONTINUED] Dextromethorphan-guaiFENesin (MUCINEX FAST-MAX DM MAX PO) Take 20 mLs by mouth as needed. 03/06/2022: Last dose this am 11:00   [DISCONTINUED] guaiFENesin-dextromethorphan (ROBITUSSIN DM) 100-10 MG/5ML syrup Take 20 mLs by mouth every 4 (four) hours as needed for cough. (Patient not taking: Reported on 02/23/2022)    [DISCONTINUED] predniSONE (DELTASONE) 10 MG tablet Take 1 tablet (10 mg total) by mouth in the morning, at noon, and at bedtime.    No facility-administered encounter medications on file as of 03/29/2022.   Allergies  Allergen Reactions   Erythromycin Swelling   Triple Antibiotic [Bacitracin-Neomycin-Polymyxin] Swelling    Blistering   ROS:  Low grade fever and URI symptoms as per HPI. No n/v, has had some loose stools. Denies shortness of breath or chest pain. Denies headaches or ear pain. No sore throat.   PHYSICAL EXAM:  BP 120/74   Pulse 84   Temp 98.4 F (36.9 C) (Tympanic)   Ht '5\' 8"'$  (1.727 m)   Wt 148 lb 3.2 oz (67.2 kg)   BMI 22.53 kg/m   Well appearing female, with frequent episodes of dry cough. Able to  stop the cough, and she speaks normally, without cough interrupting speech, in no distress. HEENT: conjunctiva and sclera are clear, EOMI. Nasal mucosa is mild-mod edematous with some clear mucus. Sinuses nontender, OP clear TM's and EAC's normal Neck: no lymphadenopathy or mass Heart: regular rate and rhythm Lungs: clear bilaterally, no wheezes, rales, ronchi Extremities: no edema  ASSESSMENT/PLAN:  Cough productive of purulent sputum - >10d sx with persistent purulence. Cover with  Doxy. To get f/u CXR, had pna last month. Cont current meds - Plan: doxycycline (VIBRA-TABS) 100 MG tablet   Go to [facility] for the chest x-ray that was previously ordered. Start doxycycline to cover for sinus infection and bronchitis. (Doesn't seem like there is a sinus infection, based on symptoms sounds more like bronchitis; the x-ray will let us know if the pneumonia has resolved or if there are any new lung findings). Take it twice daily for 10 days.

## 2022-03-31 ENCOUNTER — Encounter: Payer: Self-pay | Admitting: Family Medicine

## 2022-04-09 NOTE — Progress Notes (Signed)
Chief Complaint  Patient presents with   Follow-up    10 days follow. Still has constant drainage. Taking mucinex DM BID, asking about a good allergy tablet to take. Still coughing, but feels better over, more energy.   Patient presents for f/u on cough.  She was last seen 1/10 with persistent cough, having been seen 12/29 at Urgent care with congestion, cough. She was treated with prednisone '10mg'$  TID x 6 days, nasonex and albuterol.  No antibiotic was prescribed. No improvement was noted, despite the prednisone, albuterol and Mucinex DM BID. The albuterol helped some.  Nasonex helped some with head congestion. When seen 1/10 she reported getting up thick yellow phlegm, nasal drainage had been clear, but sinus congestion improved after getting a lot of mucus out. Her lungs were clear, was having low grade fevers.  She was treated with doxycyline (to cover sinusitis/bronchitis).  She had pneumonia earlier in December (treated with levaquin). F/u CXR was done after her last visit (due to ongoing cough).  IMPRESSION: Persistent patchy consolidation of right lung base with small right pleural effusion not significantly changed. Given the lack of resolution in 1 month, consider further evaluation with chest CT if clinically indicated.  She presents for f/u on cough s/p the course of doxycycline and to determine if CT is indicated.  Her chest now feels clear--denies chest congestion or tightness. "The chest mess" is gone. No further fever. Denies shortness of breath.  She still has constant postnasal drainage, which makes her cough some. She coughs up clear phlegm, same as from her nose. Some coughing during the day, not at night. She continues to use Nasonex 2 sprays into each nostril, every day. The mucus is fairly thin. She continues to use mucinex DM twice daily. She uses albuterol once daily, more preventatively.  She reports having a lung nodules noted on a CT scan done by GI. Prior  imaging reviewed--52m nodule noted L chest on CT abdomen in 05/2021. Saw Dr. IValeta Harmsin follow-up in 07/2021, felt to be low risk, and no f/u needed. (Nodule was on L, pneumonia was on R)  PMH, PSH, SH reviewed  Outpatient Encounter Medications as of 04/10/2022  Medication Sig Note   albuterol (VENTOLIN HFA) 108 (90 Base) MCG/ACT inhaler Inhale 1-2 puffs into the lungs every 6 (six) hours as needed for wheezing or shortness of breath. (Patient taking differently: Inhale 2 puffs into the lungs every 6 (six) hours as needed for wheezing or shortness of breath.) 04/10/2022: Using daily, once   Ascorbic Acid (VITAMIN C) 1000 MG tablet Take 1,000 mg by mouth daily.    Calcium Carbonate (CALCIUM 600 PO) Take 1 tablet by mouth daily.    cyanocobalamin (VITAMIN B12) 1000 MCG tablet Take 1,000 mcg by mouth daily.    dextromethorphan-guaiFENesin (MUCINEX DM) 30-600 MG 12hr tablet Take 1 tablet by mouth 2 (two) times daily.    Glucosamine-Vit D-Hyaluron Acd (GLUCOSAMINE PLUS VITAMIN D3 PO) Take 2 tablets by mouth daily. 11/24/2021: '2000mg'$  glucoasamine and 2000iuVit D3   mometasone (NASONEX) 50 MCG/ACT nasal spray Place 2 sprays into the nose daily.    Omega-3 Fatty Acids (FISH OIL) 500 MG CAPS Take 1 capsule by mouth daily.    vitamin E 180 MG (400 UNITS) capsule Take 400 Units by mouth daily.    [DISCONTINUED] doxycycline (VIBRA-TABS) 100 MG tablet Take 1 tablet (100 mg total) by mouth 2 (two) times daily.    No facility-administered encounter medications on file as of 04/10/2022.  Allergies  Allergen Reactions   Erythromycin Swelling   Triple Antibiotic [Bacitracin-Neomycin-Polymyxin] Swelling    Blistering    ROS:  Denies fever, chills. No n/v. Didn't get any diarrhea from ABX use, used Culturelle probiotic. Denies shortness of breath or chest pain. Denies headaches or ear pain. No sore throat. No sinus pain. +PND/cough per HPI.  Mucus is clear.  Cough overall is significantly improved.    PHYSICAL  EXAM:  BP 132/78   Pulse 80   Temp 98 F (36.7 C) (Tympanic)   Ht '5\' 8"'$  (1.727 m)   Wt 146 lb 9.6 oz (66.5 kg)   SpO2 98%   BMI 22.29 kg/m   Wt Readings from Last 3 Encounters:  04/10/22 146 lb 9.6 oz (66.5 kg)  03/29/22 148 lb 3.2 oz (67.2 kg)  03/06/22 150 lb 12.8 oz (68.4 kg)   Well appearing female in no distress. Coughed only once during the visit, with throat-clearing also. HEENT: conjunctiva and sclera are clear, EOMI. TM's and EAC's are normal. Nasal mucosa is mildly edematous, with clear mucus noted on the left.  Sinuses nontender, OP clear Neck: no lymphadenopathy or mass Heart: regular rate and rhythm Lungs: clear bilaterally, no wheezes, rales, ronchi Abdomen: soft, nontender, no mass Back: no spinal or CVA tenderness Extremities: no edema  ASSESSMENT/PLAN:  Abnormal chest x-ray - persistent infiltrate and pleural effusion noted at [facility]. Discussed repeating CXR vs CT scan.  Will check CT next week. Clinically had resolution of resp sx - Plan: CT Chest Wo Contrast  Subacute cough - significantly improved s/p doxy.  Still has some cough related to PND. Cont mucinex as needed - Plan: CT Chest Wo Contrast  Allergic rhinitis, unspecified seasonality, unspecified trigger - cont nasonex, mucinex DM prn. Rec adding once-daily antihistamine  Clinically has no e/o pneumonia or bacterial infection. Does have allergies and PND contributing to some cough and symptoms.  Discussed repeat CXR vs CT scan. Delay in care could result if abnormal finding on CT if we waited an additional month for a repeat CXR. CT scan next week  I spent 30 minutes dedicated to the care of this patient, including pre-visit review of records, face to face time, post-visit ordering of testing and documentation.

## 2022-04-10 ENCOUNTER — Ambulatory Visit (INDEPENDENT_AMBULATORY_CARE_PROVIDER_SITE_OTHER): Payer: Medicare Other | Admitting: Family Medicine

## 2022-04-10 ENCOUNTER — Encounter: Payer: Self-pay | Admitting: Family Medicine

## 2022-04-10 VITALS — BP 132/78 | HR 80 | Temp 98.0°F | Ht 68.0 in | Wt 146.6 lb

## 2022-04-10 DIAGNOSIS — R052 Subacute cough: Secondary | ICD-10-CM | POA: Diagnosis not present

## 2022-04-10 DIAGNOSIS — R9389 Abnormal findings on diagnostic imaging of other specified body structures: Secondary | ICD-10-CM | POA: Diagnosis not present

## 2022-04-10 DIAGNOSIS — J309 Allergic rhinitis, unspecified: Secondary | ICD-10-CM

## 2022-04-10 NOTE — Patient Instructions (Signed)
Someone should contact you about the CT for next week. I recommend taking a once-daily antihistamine such as claritin, zyrtec or allegra.  Continue the nasonex daily, and the mucinex DM as needed. Consider NOT taking the albuterol daily as a preventative thing--it really should ve used as needed IF/WHEN you feel tight or wheezy, or any shortness of breath.

## 2022-05-02 NOTE — Patient Instructions (Incomplete)
  HEALTH MAINTENANCE RECOMMENDATIONS:  It is recommended that you get at least 30 minutes of aerobic exercise at least 5 days/week (for weight loss, you may need as much as 60-90 minutes). This can be any activity that gets your heart rate up. This can be divided in 10-15 minute intervals if needed, but try and build up your endurance at least once a week.  Weight bearing exercise is also recommended twice weekly.  Eat a healthy diet with lots of vegetables, fruits and fiber.  "Colorful" foods have a lot of vitamins (ie green vegetables, tomatoes, red peppers, etc).  Limit sweet tea, regular sodas and alcoholic beverages, all of which has a lot of calories and sugar.  Up to 1 alcoholic drink daily may be beneficial for women (unless trying to lose weight, watch sugars).  Drink a lot of water.  Calcium recommendations are 1200-1500 mg daily (1500 mg for postmenopausal women or women without ovaries), and vitamin D 1000 IU daily.  This should be obtained from diet and/or supplements (vitamins), and calcium should not be taken all at once, but in divided doses.  Monthly self breast exams and yearly mammograms for women over the age of 72 is recommended.  Sunscreen of at least SPF 30 should be used on all sun-exposed parts of the skin when outside between the hours of 10 am and 4 pm (not just when at beach or pool, but even with exercise, golf, tennis, and yard work!)  Use a sunscreen that says "broad spectrum" so it covers both UVA and UVB rays, and make sure to reapply every 1-2 hours.  Remember to change the batteries in your smoke detectors when changing your clock times in the spring and fall. Carbon monoxide detectors are recommended for your home.  Use your seat belt every time you are in a car, and please drive safely and not be distracted with cell phones and texting while driving.   Joan Morales , Thank you for taking time to come for your Medicare Wellness Visit. I appreciate your ongoing  commitment to your health goals. Please review the following plan we discussed and let me know if I can assist you in the future.   This is a list of the screening recommended for you and due dates:  Health Maintenance  Topic Date Due   COVID-19 Vaccine (4 - 2023-24 season) 11/18/2021   Mammogram  12/27/2022   Medicare Annual Wellness Visit  05/04/2023   DTaP/Tdap/Td vaccine (3 - Td or Tdap) 10/19/2025   Pneumonia Vaccine  Completed   Flu Shot  Completed   DEXA scan (bone density measurement)  Completed   Zoster (Shingles) Vaccine  Completed   HPV Vaccine  Aged Out   Your next bone density test should be in 2025-2026.  I recommend getting the new RSV vaccine in the Fall (along with your flu shot). Consider COVID booster.  Please bring [name] copies of your Living Will and [place] once completed and notarized so that it can be scanned into your medical chart.  You can consider holding the fish oil for a couple of weeks to see if your cough improves.  If it is better when you don't take the fish oil, stay off of it. You can also try taking pepcid or prilosec for a couple of weeks.  Reflux can also cause a chronic cough.

## 2022-05-02 NOTE — Progress Notes (Signed)
Chief Complaint  Patient presents with   Medicare Wellness    Fasting AWV with pelvic exam. Still has some drainage from when she was sick that she cannot seem to get rid of. Has not gotten RSV or covid vaccines from pharmacy.    Joan Morales is a 82 y.o. female who presents for annual wellness visit and follow-up on chronic medical conditions.    She was last seen 1/22 for f/u abnormal CXR. At that time her chest felt clear (not congestion or tightness), but had some ongoing PND which caused slight cough. The nasal drainage and phlegm were clear.  She was using Nasonex daily and Mucinex DM BID at that visit, and albuterol once daily. Today she reports still having the drainage and cough (all clear).  She is now taking Zyrtec daily, in addition to the nightly Nasonex.  She no longer uses Mucinex. She still coughs off and on, can't tell much of a difference since she stopped mucinex (was coughing while taking it also). She is scheduled for CT on 2/16 (apparently the imaging place was backed up, nobody contacted me about changing location or delay in scan).  H/o GI problems--Bowel changes/diarrhea, decreased appetite and weight loss, under the care of Dr. Ardis Hughs. 04/2021 EGD--mild inflammation at gastric antrum. Biopsy--mucosa with mild nonspecific reactive gastropathy. 04/2021 Colonoscopy--diverticulosis, benign biopsies. She had CT 05/2021: IMPRESSION: 1. No acute abnormality. No evidence of bowel obstruction or acute bowel inflammation. Marked sigmoid diverticulosis, with no evidence of acute diverticulitis. 2. Left lower lobe 4 mm solid pulmonary nodule. No follow-up needed if patient is low-risk.This recommendation follows the consensus statement: Guidelines for Management of Incidental Pulmonary Nodules Detected on CT Images: From the Fleischner Society 2017; Radiology 2017; 284:228-243. 3. No abdominopelvic lymphadenopathy. 4. Tiny cystocele. 5. Aortic Atherosclerosis  (ICD10-I70.0).  She was referred to pulmonary by GI for the nodules.She saw Dr. Valeta Harms in 07/2021. It was felt to be a low risk lung nodule, found incidentally, and no additional follow-up was recommended.  She no longer has any diarrhea or GI complaints.  She now eats dairy without problems (not needing Lactaid).  Osteopenia:  She started on fosamax in 02/2012.  Initial T-score was T-2.4, and high risk FRAX score (hip and elsewhere).  She started having problems with her right jaw, and stopped taking the alendronate in 09/2016.  DEXA in 11/21/2018: T-2 at R fem neck, -1.3 at L fem neck, -1.6 at total L femur; reportedly stable at hips.  Repeat DEXA 06/2021: T-1.8 at R femoral neck. At one point (02/2021) she had stopped taking her calcium supplement and vitamin D when she got sick with diarrhea.  She has been back on her calcium and vitamin D since her diarrhea resolved. Not getting any weight-bearing exercise.  Mild hyperlipidemia:  LDL in 2018 was up to 145, higher than prior checks. Rechecks were up and down (2019 LDL was down to 134, was up to 154, in 12/2019.  LDL was significantly improved at 111 in 04/2021 (but significant changes in diet due to her GI issues at that time).  Due for recheck.  She eats 4-5 eggs/week, mostly just the egg whites. She no longer eats any red meat, mostly chicken.  No longer has any bacon or sausage. She eats some cheese (daily)  Aortic atherosclerosis was noted on CT scan done 05/2021. She was not aware of this, discussed.    Lab Results  Component Value Date   CHOL 181 04/22/2021   HDL 56 04/22/2021  LDLCALC 111 (H) 04/22/2021   TRIG 75 04/22/2021   CHOLHDL 3.2 04/22/2021    Immunization History  Administered Date(s) Administered   Fluad Quad(high Dose 65+) 12/11/2018, 01/14/2020, 02/23/2021, 01/19/2022   Influenza Split 01/04/2012   Influenza, High Dose Seasonal PF 12/15/2013, 02/26/2015, 12/05/2017   PFIZER(Purple Top)SARS-COV-2 Vaccination 05/03/2019,  05/28/2019, 12/29/2019   Pfizer Covid-19 Vaccine Bivalent Booster 45yr & up 05/20/2021   Pneumococcal Conjugate-13 06/04/2013   Pneumococcal Polysaccharide-23 09/17/2008, 12/11/2018   Td 10/20/2015   Tdap 05/07/2005   Zoster Recombinat (Shingrix) 12/20/2017, 09/08/2020   Last Pap smear: s/p hysterectomy   Last mammogram: 12/2021 Last colonoscopy: 04/2021 Dr. JArdis Hughs diverticulosis, benign biopsies. Last DEXA: 06/2021 T-1.8 at R femoral neck Dentist: twice yearly for cleanings Ophtho:  yearly Exercise: walking in the house 15 minutes (working back up since she was sick), 3x/week. No weight-bearing exercise. Has some hand weights at home.  Vitamin D screen: normal 09/2016, level of  36   Patient Care Team: KRita Ohara MD as PCP - General (Family Medicine) MRenette Butters MD as Attending Physician (Orthopedic Surgery) RArmond Hang MD as Consulting Physician (Orthopedic Surgery) MHayden Pedro MD as Consulting Physician (Ophthalmology) JMilus Banister MD as Attending Physician (Gastroenterology) Dr. EMoss McDDS as Consulting Physician (Dentistry) MFaythe Casa PUtahGWarden Fillers MD as Consulting Physician (Ophthalmology) JUlla Gallo MD as Consulting Physician (Dermatology) Dr. IValeta Harms pulmonary  Depression Screening: [facility] Total Score 0        Falls screen:     05/03/2022    9:53 AM 04/25/2021    2:36 PM 02/23/2021   11:18 AM 12/29/2019    9:58 AM 12/11/2018    9:59 AM  FOwossoin the past year? 0 1 1 0 0  Number falls in past yr: 0 0 1    Injury with Fall? 0 1 1    Comment  fx R 5th metatarsal and tibial plateau fx R 5th metatarsal and L tibia    Risk for fall due to : No Fall Risks History of fall(s) History of fall(s)    Follow up Falls evaluation completed Falls evaluation completed Falls evaluation completed       Functional Status Survey: Is the patient  deaf or have difficulty hearing?: No Does the patient have difficulty seeing, even when wearing glasses/contacts?: Yes (has appt in May, thinks she need cataract surgery on R eye) Does the patient have difficulty concentrating, remembering, or making decisions?: Yes (short term age related) Does the patient have difficulty walking or climbing stairs?: Yes (trouble with stairs because of arthritis in R knee) Does the patient have difficulty dressing or bathing?: No Does the patient have difficulty doing errands alone such as visiting a doctor's office or shopping?: No  Mini-Cog Scoring: 5    End of Life Discussion:  Patient has a living will and medical power of attorney.  We do not have copies.   PMH, PSH, SH and FH were reviewed and updated  Outpatient Encounter Medications as of 05/03/2022  Medication Sig Note   Ascorbic Acid (VITAMIN C) 1000 MG tablet Take 1,000 mg by mouth daily.    Calcium Carbonate (CALCIUM 600 PO) Take 1 tablet by mouth daily.    cetirizine (ZYRTEC) 10 MG tablet Take 10 mg by mouth daily.    cyanocobalamin (VITAMIN B12) 1000 MCG tablet Take 1,000 mcg by mouth daily.    Glucosamine-Vit D-Hyaluron  Acd (GLUCOSAMINE PLUS VITAMIN D3 PO) Take 2 tablets by mouth daily. 11/24/2021: 2026m glucoasamine and 2000iuVit D3   mometasone (NASONEX) 50 MCG/ACT nasal spray Place 2 sprays into the nose daily.    Omega-3 Fatty Acids (FISH OIL) 500 MG CAPS Take 1 capsule by mouth daily.    vitamin E 180 MG (400 UNITS) capsule Take 400 Units by mouth daily.    albuterol (VENTOLIN HFA) 108 (90 Base) MCG/ACT inhaler Inhale 1-2 puffs into the lungs every 6 (six) hours as needed for wheezing or shortness of breath. (Patient not taking: Reported on 05/03/2022) 05/03/2022: As needed   [DISCONTINUED] dextromethorphan-guaiFENesin (MUCINEX DM) 30-600 MG 12hr tablet Take 1 tablet by mouth 2 (two) times daily.    No facility-administered encounter medications on file as of 05/03/2022.   Allergies   Allergen Reactions   Erythromycin Swelling   Triple Antibiotic [Bacitracin-Neomycin-Polymyxin] Swelling    Blistering    ROS: The patient denies anorexia, fever, headaches, decreased hearing, ear pain, sore throat, breast concerns, chest pain, palpitations, dizziness, syncope, dyspnea on exertion, swelling, nausea, vomiting, diarrhea, constipation, abdominal pain, melena, hematochezia, incontinence, dysuria, vaginal bleeding, discharge, odor or itch, genital lesions, numbness, tingling, weakness, tremor, suspicious skin lesions, depression, anxiety, abnormal bleeding/bruising, or enlarged lymph nodes.   Bowels are much better, no further diarrhea. PND/allergies and coughing, per HPI. Denies chest pain, shortness of breath.   PHYSICAL EXAM:  BP 130/80   Pulse 76   Ht 5' 8"$  (1.727 m)   Wt 149 lb 3.2 oz (67.7 kg)   BMI 22.69 kg/m   Wt Readings from Last 3 Encounters:  05/03/22 149 lb 3.2 oz (67.7 kg)  04/10/22 146 lb 9.6 oz (66.5 kg)  03/29/22 148 lb 3.2 oz (67.2 kg)  02/23/21 151# 6.4 oz (first visit for diarrhea) 12/2019 162# 12.8 oz   General Appearance:   Alert, cooperative, no distress, appears stated age.  Frequent throat-clearing and coughing during visit.    Head:   Normocephalic, without obvious abnormality, atraumatic    Eyes:   PERRL, conjunctiva/corneas clear, EOM's intact, fundi normal on the left, not as well visualized on the right.    Ears:   Normal TM's and external ear canals.   Nose:   Mild-mod edema, pale, no drainage.  Sinuses nontender  Throat:   Normal mucosa   Neck:   Supple, no lymphadenopathy; thyroid: no enlargement/ tenderness/nodules; no carotid bruit or JVD    Back:   Spine nontender, no curvature, ROM normal, no CVA tenderness  Lungs:   Clear to auscultation bilaterally without wheezes, rales or ronchi; respirations unlabored    Chest Wall:   No tenderness or deformity. Prominent manubrium, nontender  Heart:   Regular rate and rhythm, S1 and S2  normal, no murmur, rub   or gallop    Breast Exam:   No tenderness, masses, or nipple discharge or inversion. No axillary lymphadenopathy    Abdomen:   Soft, non-tender, nondistended, normoactive bowel sounds,   no masses, no hepatosplenomegaly    Genitalia:   Normal external genitalia without lesions, mild atrophic changes. BUS and vagina normal; bimanual exam normal--no pelvic masses appreciable, no tenderness. Pap not performed    Rectal:   Normal sphincter tone, no masses. Heme negative stool  Extremities:   No clubbing, cyanosis or edema. Bilateral bunions, hammertoes and callouses. 2nd toes crossed/overlapping great toes bilaterally. Onychomycotic, thickened nails. Arthritic deformity to  2nd and 3rd DIP's in hands bilaterally. Unchanged from prior exams  Pulses:  1+ at LLE, 2+ elsewhere  Skin:   Skin color, texture, turgor normal, no rashes. Scattered cherry hemangiomas, some SK's. Purpura noted on R hand, amidst hyperpigmentation.  Lymph nodes:   Cervical, supraclavicular, inguinal and axillary nodes normal    Neurologic:   Normal strength, sensation and gait; reflexes 2+ and symmetric throughout                           Psych:    Normal mood, affect, hygiene and grooming   ASSESSMENT/PLAN:  Medicare annual wellness visit, subsequent  Hypercholesteremia - continue lowfat, low cholesterol diet. Due for recheck, since diet back to normal - Plan: Lipid panel, rosuvastatin (CRESTOR) 10 MG tablet  Aortic atherosclerosis ([place]) - noted on CT 05/2021.  Discussed statin recommendation. Given age, await lipid results to determine need/dose. - Plan: rosuvastatin (CRESTOR) 10 MG tablet  Senile purpura ([place]) - on hand - Plan: CBC with Differential/Platelet  Osteopenia, unspecified location - Discussed Ca, D, weight-bearing exercise  Abnormal chest x-ray - CT scan as scheduled  Allergic rhinitis, unspecified seasonality, unspecified trigger - cont nasal steroids, antihistamines.  Mucinex DM  if needed for cough  Subacute cough - await CT scan - Plan: CBC with Differential/Platelet, Comprehensive metabolic panel  Discussed monthly self breast exams and yearly mammograms; at least 30 minutes of aerobic activity at least 5 days/week, weight-bearing exercise at least 2x/wk; proper sunscreen use reviewed; healthy diet, including goals of calcium and vitamin D intake and alcohol recommendations (less than or equal to 1 drink/day) reviewed; regular seatbelt use; changing batteries in smoke detectors. Immunization recommendations discussed--continue yearly flu shots.  Discussed RSV vaccine, plan to get in the fall. COVID booster discussed, not desired today. Colon cancer screening is UTD   MOST form reviewed and updated/signed.  Full code, full care, no prolonged IV fluids/tube feeds. Requested copy of living will and healthcare power of attorney.    Medicare Attestation I have personally reviewed: The patient's medical and social history Their use of alcohol, tobacco or illicit drugs Their current medications and supplements The patient's functional ability including ADLs,fall risks, home safety risks, cognitive, and hearing and visual impairment Diet and physical activities Evidence for depression or mood disorders  The patient's weight, height, BMI have been recorded in the chart.  I have made referrals, counseling, and provided education to the patient based on review of the above and I have provided the patient with a written personalized care plan for preventive services.

## 2022-05-03 ENCOUNTER — Ambulatory Visit (INDEPENDENT_AMBULATORY_CARE_PROVIDER_SITE_OTHER): Payer: Medicare Other | Admitting: Family Medicine

## 2022-05-03 ENCOUNTER — Encounter: Payer: Self-pay | Admitting: Family Medicine

## 2022-05-03 VITALS — BP 130/80 | HR 76 | Ht 68.0 in | Wt 149.2 lb

## 2022-05-03 DIAGNOSIS — Z0000 Encounter for general adult medical examination without abnormal findings: Secondary | ICD-10-CM | POA: Diagnosis not present

## 2022-05-03 DIAGNOSIS — R9389 Abnormal findings on diagnostic imaging of other specified body structures: Secondary | ICD-10-CM

## 2022-05-03 DIAGNOSIS — E7800 Pure hypercholesterolemia, unspecified: Secondary | ICD-10-CM

## 2022-05-03 DIAGNOSIS — D692 Other nonthrombocytopenic purpura: Secondary | ICD-10-CM | POA: Diagnosis not present

## 2022-05-03 DIAGNOSIS — R052 Subacute cough: Secondary | ICD-10-CM | POA: Diagnosis not present

## 2022-05-03 DIAGNOSIS — M8580 Other specified disorders of bone density and structure, unspecified site: Secondary | ICD-10-CM | POA: Diagnosis not present

## 2022-05-03 DIAGNOSIS — I700 Atherosclerosis of aorta: Secondary | ICD-10-CM

## 2022-05-03 DIAGNOSIS — J309 Allergic rhinitis, unspecified: Secondary | ICD-10-CM | POA: Diagnosis not present

## 2022-05-03 LAB — LIPID PANEL

## 2022-05-04 LAB — COMPREHENSIVE METABOLIC PANEL
ALT: 13 IU/L (ref 0–32)
AST: 18 IU/L (ref 0–40)
Albumin/Globulin Ratio: 1.8 (ref 1.2–2.2)
Albumin: 4.4 g/dL (ref 3.8–4.8)
Alkaline Phosphatase: 65 IU/L (ref 44–121)
BUN/Creatinine Ratio: 19 (ref 12–28)
BUN: 15 mg/dL (ref 8–27)
Bilirubin Total: 0.4 mg/dL (ref 0.0–1.2)
CO2: 24 mmol/L (ref 20–29)
Calcium: 9.6 mg/dL (ref 8.7–10.3)
Chloride: 101 mmol/L (ref 96–106)
Creatinine, Ser: 0.8 mg/dL (ref 0.57–1.00)
Globulin, Total: 2.5 g/dL (ref 1.5–4.5)
Glucose: 93 mg/dL (ref 70–99)
Potassium: 4.1 mmol/L (ref 3.5–5.2)
Sodium: 140 mmol/L (ref 134–144)
Total Protein: 6.9 g/dL (ref 6.0–8.5)
eGFR: 74 mL/min/{1.73_m2} (ref >59)

## 2022-05-04 LAB — LIPID PANEL
Chol/HDL Ratio: 2.9 ratio (ref 0.0–4.4)
Cholesterol, Total: 254 mg/dL — ABNORMAL HIGH (ref 100–199)
HDL: 87 mg/dL (ref >39)
LDL Chol Calc (NIH): 151 mg/dL — ABNORMAL HIGH (ref 0–99)
Triglycerides: 93 mg/dL (ref 0–149)
VLDL Cholesterol Cal: 16 mg/dL (ref 5–40)

## 2022-05-04 LAB — CBC WITH DIFFERENTIAL/PLATELET
Basophils Absolute: 0.1 10*3/uL (ref 0.0–0.2)
Basos: 1 %
EOS (ABSOLUTE): 0.2 10*3/uL (ref 0.0–0.4)
Eos: 3 %
Hematocrit: 41.8 % (ref 34.0–46.6)
Hemoglobin: 13.5 g/dL (ref 11.1–15.9)
Immature Grans (Abs): 0 10*3/uL (ref 0.0–0.1)
Immature Granulocytes: 0 %
Lymphocytes Absolute: 2.1 10*3/uL (ref 0.7–3.1)
Lymphs: 30 %
MCH: 28.5 pg (ref 26.6–33.0)
MCHC: 32.3 g/dL (ref 31.5–35.7)
MCV: 88 fL (ref 79–97)
Monocytes Absolute: 0.5 10*3/uL (ref 0.1–0.9)
Monocytes: 8 %
Neutrophils Absolute: 4.1 10*3/uL (ref 1.4–7.0)
Neutrophils: 58 %
Platelets: 265 10*3/uL (ref 150–450)
RBC: 4.74 x10E6/uL (ref 3.77–5.28)
RDW: 13.2 % (ref 11.7–15.4)
WBC: 7 10*3/uL (ref 3.4–10.8)

## 2022-05-04 MED ORDER — ROSUVASTATIN CALCIUM 10 MG PO TABS: 10.0000 mg | ORAL_TABLET | Freq: Every day | ORAL | 2 refills | Status: DC

## 2022-05-05 ENCOUNTER — Ambulatory Visit
Admission: RE | Admit: 2022-05-05 | Discharge: 2022-05-05 | Disposition: A | Discharge status: Home / Self Care | Payer: Medicare Other | Source: Ambulatory Visit | Attending: Family Medicine | Admitting: Family Medicine

## 2022-05-05 DIAGNOSIS — I700 Atherosclerosis of aorta: Secondary | ICD-10-CM | POA: Diagnosis not present

## 2022-05-05 DIAGNOSIS — R918 Other nonspecific abnormal finding of lung field: Secondary | ICD-10-CM | POA: Diagnosis not present

## 2022-05-05 DIAGNOSIS — R052 Subacute cough: Secondary | ICD-10-CM

## 2022-05-05 DIAGNOSIS — R9389 Abnormal findings on diagnostic imaging of other specified body structures: Secondary | ICD-10-CM

## 2022-05-05 DIAGNOSIS — R911 Solitary pulmonary nodule: Secondary | ICD-10-CM | POA: Diagnosis not present

## 2022-05-08 ENCOUNTER — Other Ambulatory Visit: Payer: Self-pay | Admitting: *Deleted

## 2022-05-08 DIAGNOSIS — Z5181 Encounter for therapeutic drug level monitoring: Secondary | ICD-10-CM

## 2022-05-08 DIAGNOSIS — Z0000 Encounter for general adult medical examination without abnormal findings: Secondary | ICD-10-CM

## 2022-05-08 DIAGNOSIS — E7800 Pure hypercholesterolemia, unspecified: Secondary | ICD-10-CM

## 2022-05-11 ENCOUNTER — Other Ambulatory Visit: Payer: Self-pay | Admitting: *Deleted

## 2022-05-11 DIAGNOSIS — E042 Nontoxic multinodular goiter: Secondary | ICD-10-CM

## 2022-06-07 ENCOUNTER — Ambulatory Visit
Admission: RE | Admit: 2022-06-07 | Discharge: 2022-06-07 | Disposition: A | Discharge status: Home / Self Care | Payer: Medicare Other | Source: Ambulatory Visit | Attending: Family Medicine | Admitting: Family Medicine

## 2022-06-07 DIAGNOSIS — E042 Nontoxic multinodular goiter: Secondary | ICD-10-CM | POA: Diagnosis not present

## 2022-06-08 ENCOUNTER — Other Ambulatory Visit: Payer: Self-pay | Admitting: *Deleted

## 2022-06-08 ENCOUNTER — Other Ambulatory Visit: Payer: Self-pay | Admitting: Family Medicine

## 2022-06-08 DIAGNOSIS — E042 Nontoxic multinodular goiter: Secondary | ICD-10-CM

## 2022-06-28 ENCOUNTER — Encounter: Payer: Self-pay | Admitting: Pulmonary Disease

## 2022-06-28 ENCOUNTER — Ambulatory Visit (INDEPENDENT_AMBULATORY_CARE_PROVIDER_SITE_OTHER): Payer: Medicare Other | Admitting: Pulmonary Disease

## 2022-06-28 VITALS — BP 140/70 | HR 75 | Ht 68.0 in | Wt 151.6 lb

## 2022-06-28 DIAGNOSIS — R918 Other nonspecific abnormal finding of lung field: Secondary | ICD-10-CM | POA: Diagnosis not present

## 2022-06-28 DIAGNOSIS — J479 Bronchiectasis, uncomplicated: Secondary | ICD-10-CM | POA: Diagnosis not present

## 2022-06-28 NOTE — Progress Notes (Signed)
Synopsis: Referred in April 2024 for pulmonary nodule by Joselyn ArrowKnapp, Eve, MD  Subjective:   PATIENT ID: Joan Morales GENDER: female DOB: [birth year], MRN: [id number]  Chief Complaint  Patient presents with   Follow-up    F/up, lung nodule.    This is a 82 year old female, past medical history of allergic rhinitis, gastroesophageal reflux, ovarian teratoma, osteopenia.  Patient is a lifelong non-smoker.She was referred after having a CT scan of the chest in February 2024.  This revealed diffuse areas of groundglass a few tree-in-bud abnormalities.  Also a 7 mm left lower lobe pulmonary nodule and a 8 mm groundglass opacity in the posterior right upper lobe.  Patient has not had a previous axial CT imaging in the past for comparison.  We reviewed her CT imaging today in the office.     Past Medical History:  Diagnosis Date   Allergic rhinitis, cause unspecified    Arthritis    "hands" (03/10/2014)   Cataract    GERD (gastroesophageal reflux disease)    Metatarsal fracture 12/30/2020   right 5th   Osteopenia    fosamax started 01/2012   Ovarian teratoma    left, removed 12/06   PONV (postoperative nausea and vomiting)    Pure hypercholesterolemia    [place]hingles 03/2018   [place], nose, lip   Tibial plateau fracture, left 12/30/2020     Family History  Problem Relation Age of Onset   Heart disease Mother    COPD Father        smoker   Fibromyalgia Sister    Irritable bowel syndrome Sister    Colitis Sister    Colon cancer Paternal Aunt [id number]   Diabetes Paternal Aunt    Cancer - Colon Paternal Aunt [id number]   Breast cancer Maternal Grandmother        50's   Asthma Paternal Grandmother    Esophageal cancer Neg Hx    Stomach cancer Neg Hx      Past Surgical History:  Procedure Laterality Date   CATARACT EXTRACTION Left 11/2015   EYE SURGERY     FRACTURE SURGERY     GAS INSERTION Left 03/10/2014   Procedure: INSERTION OF GAS-[id number];   Surgeon: Sherrie GeorgeJohn D Matthews, MD;  [facility];  Service: Ophthalmology;  Laterality: Left;   LASER PHOTO ABLATION Left 03/10/2014   Procedure: LASER PHOTO ABLATION-HEADSCOPE LASER;  Surgeon: Sherrie GeorgeJohn D Matthews, MD;  [facility];  Service: Ophthalmology;  Laterality: Left;   SCLERAL BUCKLE Left 03/10/2014   SCLERAL BUCKLE WITH CRYO Left 03/10/2014   Procedure: SCLERAL BUCKLE WITH CRYO AND DIATHERMY;  Surgeon: Sherrie GeorgeJohn D Matthews, MD;  [facility];  Service: Ophthalmology;  Laterality: Left;   TONSILLECTOMY     TOTAL ABDOMINAL HYSTERECTOMY  02/2005   w/BSO; L ovarian teratoma   TOTAL ABDOMINAL HYSTERECTOMY W/ BILATERAL SALPINGOOPHORECTOMY  12/06   L ovarian teratoma   WRIST FRACTURE SURGERY Right 02/26/14   plate and screws    Social History   Socioeconomic History   Marital status: Married    Spouse name: Not on file   Number of children: 1   Years of education: Not on file   Highest education level: Not on file  Occupational History   Occupation: retired  Tobacco Use   Smoking status: Never   Smokeless tobacco: Never  Vaping Use   Vaping Use: Never used  Substance and Sexual Activity   Alcohol use: No  Drug use: No   Sexual activity: Not Currently    Partners: Male    Comment: husband with impotence issues  Other Topics Concern   Not on file  Social History Narrative   Married, lives with husband.  Son lives in [place], [place].  3 grandchildren.  Retired (2013; Radiation protection practitioner).      Reviewed/updated 04/2022   Social Determinants of Health   Financial Resource Strain: Not on file  Food Insecurity: Not on file  Transportation Needs: Not on file  Physical Activity: Not on file  Stress: Not on file  Social Connections: Not on file  Intimate Partner Violence: Not on file     Allergies  Allergen Reactions   Erythromycin Swelling   Triple Antibiotic [Bacitracin-Neomycin-Polymyxin] Swelling    Blistering     Outpatient Medications Prior to Visit  Medication Sig  Dispense Refill   Ascorbic Acid (VITAMIN C) 1000 MG tablet Take 1,000 mg by mouth daily.     Calcium Carbonate (CALCIUM 600 PO) Take 1 tablet by mouth daily.     cetirizine (ZYRTEC) 10 MG tablet Take 10 mg by mouth daily.     cyanocobalamin (VITAMIN B12) 1000 MCG tablet Take 1,000 mcg by mouth daily.     Glucosamine-Vit D-Hyaluron Acd (GLUCOSAMINE PLUS VITAMIN D3 PO) Take 2 tablets by mouth daily.     Omega-3 Fatty Acids (FISH OIL) 500 MG CAPS Take 1 capsule by mouth daily.     rosuvastatin (CRESTOR) 10 MG tablet Take 1 tablet (10 mg total) by mouth daily. 30 tablet 2   vitamin E 180 MG (400 UNITS) capsule Take 400 Units by mouth daily.     albuterol (VENTOLIN HFA) 108 (90 Base) MCG/ACT inhaler Inhale 1-2 puffs into the lungs every 6 (six) hours as needed for wheezing or shortness of breath. (Patient not taking: Reported on 05/03/2022) 1 each 0   mometasone (NASONEX) 50 MCG/ACT nasal spray Place 2 sprays into the nose daily. (Patient not taking: Reported on 06/28/2022) 1 each 12   No facility-administered medications prior to visit.    Review of Systems  Constitutional:  Negative for chills, fever, malaise/fatigue and weight loss.  HENT:  Negative for hearing loss, sore throat and tinnitus.   Eyes:  Negative for blurred vision and double vision.  Respiratory:  Negative for cough, hemoptysis, sputum production, shortness of breath, wheezing and stridor.   Cardiovascular:  Negative for chest pain, palpitations, orthopnea, leg swelling and PND.  Gastrointestinal:  Negative for abdominal pain, constipation, diarrhea, heartburn, nausea and vomiting.  Genitourinary:  Negative for dysuria, hematuria and urgency.  Musculoskeletal:  Negative for joint pain and myalgias.  Skin:  Negative for itching and rash.  Neurological:  Negative for dizziness, tingling, weakness and headaches.  Endo/Heme/Allergies:  Negative for environmental allergies. Does not bruise/bleed easily.  Psychiatric/Behavioral:   Negative for depression. The patient is not nervous/anxious and does not have insomnia.   All other systems reviewed and are negative.    Objective:  Physical Exam Vitals reviewed.  Constitutional:      General: She is not in acute distress.    Appearance: She is well-developed.  HENT:     Head: Normocephalic and atraumatic.  Eyes:     General: No scleral icterus.    Conjunctiva/sclera: Conjunctivae normal.     Pupils: Pupils are equal, round, and reactive to light.  Neck:     Vascular: No JVD.     Trachea: No tracheal deviation.  Cardiovascular:     Rate  and Rhythm: Normal rate and regular rhythm.     Heart sounds: Normal heart sounds. No murmur heard. Pulmonary:     Effort: Pulmonary effort is normal. No tachypnea, accessory muscle usage or respiratory distress.     Breath sounds: No stridor. No wheezing, rhonchi or rales.  Abdominal:     General: There is no distension.     Palpations: Abdomen is soft.     Tenderness: There is no abdominal tenderness.  Musculoskeletal:        General: No tenderness.     Cervical back: Neck supple.  Lymphadenopathy:     Cervical: No cervical adenopathy.  Skin:    General: Skin is warm and dry.     Capillary Refill: Capillary refill takes less than 2 seconds.     Findings: No rash.  Neurological:     Mental Status: She is alert and oriented to person, place, and time.  Psychiatric:        Behavior: Behavior normal.      Vitals:   06/28/22 0938  BP: (!) 140/70  Pulse: 75  SpO2: 97%  Weight: 151 lb 9.6 oz (68.8 kg)  Height: 5\' 8"  (1.727 m)   97% on RA BMI Readings from Last 3 Encounters:  06/28/22 23.05 kg/m  05/03/22 22.69 kg/m  04/10/22 22.29 kg/m   Wt Readings from Last 3 Encounters:  06/28/22 151 lb 9.6 oz (68.8 kg)  05/03/22 149 lb 3.2 oz (67.7 kg)  04/10/22 146 lb 9.6 oz (66.5 kg)     CBC    Component Value Date/Time   WBC 7.0 05/03/2022 1109   WBC 6.4 11/04/2014 0001   RBC 4.74 05/03/2022 1109   RBC  4.50 11/04/2014 0001   HGB 13.5 05/03/2022 1109   HCT 41.8 05/03/2022 1109   PLT 265 05/03/2022 1109   MCV 88 05/03/2022 1109   MCH 28.5 05/03/2022 1109   MCH 29.1 11/04/2014 0001   MCHC 32.3 05/03/2022 1109   MCHC 33.0 11/04/2014 0001   RDW 13.2 05/03/2022 1109   LYMPHSABS 2.1 05/03/2022 1109   MONOABS 0.6 11/04/2014 0001   EOSABS 0.2 05/03/2022 1109   BASOSABS 0.1 05/03/2022 1109     Chest Imaging:  05/09/2022 CT chest: Areas of tree-in-bud, groundglass, small pulmonary nodules. The patient's images have been independently reviewed by me.    Pulmonary Functions Testing Results:     No data to display          FeNO:   Pathology:   Echocardiogram:   Heart Catheterization:     Assessment & Plan:     ICD-10-CM   1. Lung nodules  R91.8 CT CHEST WO CONTRAST    2. Ground glass opacity present on imaging of lung  R91.8 CT CHEST WO CONTRAST    3. Bronchiectasis without complication  J47.9       Discussion:  This is a 82 year old female seen today for abnormal CT imaging.  No family history of NTM.  Does not have any significant cough or sputum production.  She does have mild bronchiectasis on imaging.  She has scattered pulmonary nodules.  Plan: Overall her CT imaging looks relatively benign. Will recommend follow-up CT imaging in 6 months. If she has recurrent exacerbations symptoms related to her bronchiectasis then we could consider bronchodilators. At this time can just use albuterol as needed. If she continues to have ongoing respiratory symptoms we could consider PFTs in the future. Alternatively if she has daily sputum production that develops would consider  respiratory cultures to rule out NTM.  Or if she has progression on CT imaging of her tree-in-bud opacities could always offer bronchoscopy in the future for cultures. However at this time we will pursue more conservative route. Patient is agreeable to this plan.  Repeat CT imaging in 6 months and  follow-up with [name] in clinic after that.   Current Outpatient Medications:    Ascorbic Acid (VITAMIN C) 1000 MG tablet, Take 1,000 mg by mouth daily., Disp: , Rfl:    Calcium Carbonate (CALCIUM 600 PO), Take 1 tablet by mouth daily., Disp: , Rfl:    cetirizine (ZYRTEC) 10 MG tablet, Take 10 mg by mouth daily., Disp: , Rfl:    cyanocobalamin (VITAMIN B12) 1000 MCG tablet, Take 1,000 mcg by mouth daily., Disp: , Rfl:    Glucosamine-Vit D-Hyaluron Acd (GLUCOSAMINE PLUS VITAMIN D3 PO), Take 2 tablets by mouth daily., Disp: , Rfl:    Omega-3 Fatty Acids (FISH OIL) 500 MG CAPS, Take 1 capsule by mouth daily., Disp: , Rfl:    rosuvastatin (CRESTOR) 10 MG tablet, Take 1 tablet (10 mg total) by mouth daily., Disp: 30 tablet, Rfl: 2   vitamin E 180 MG (400 UNITS) capsule, Take 400 Units by mouth daily., Disp: , Rfl:    albuterol (VENTOLIN HFA) 108 (90 Base) MCG/ACT inhaler, Inhale 1-2 puffs into the lungs every 6 (six) hours as needed for wheezing or shortness of breath. (Patient not taking: Reported on 05/03/2022), Disp: 1 each, Rfl: 0   mometasone (NASONEX) 50 MCG/ACT nasal spray, Place 2 sprays into the nose daily. (Patient not taking: Reported on 06/28/2022), Disp: 1 each, Rfl: 12   Josephine Igo, DO [facility] 06/28/2022 10:07 AM

## 2022-06-28 NOTE — Patient Instructions (Signed)
Thank you for visiting Dr. Tonia Brooms at [facility]. Today we recommend the following:  Orders Placed This Encounter  Procedures   CT CHEST WO CONTRAST   Albuterol as needed   Return in about 6 months (around 12/28/2022) for with Kandice Robinsons, NP, or Dr. Tonia Brooms, after CT Chest.    Please do your part to reduce the spread of COVID-19.

## 2022-07-05 ENCOUNTER — Ambulatory Visit
Admission: RE | Admit: 2022-07-05 | Discharge: 2022-07-05 | Disposition: A | Discharge status: Home / Self Care | Payer: Medicare Other | Source: Ambulatory Visit | Attending: Family Medicine | Admitting: Family Medicine

## 2022-07-05 ENCOUNTER — Other Ambulatory Visit (HOSPITAL_COMMUNITY)
Admission: RE | Admit: 2022-07-05 | Discharge: 2022-07-05 | Disposition: A | Discharge status: Home / Self Care | Payer: Medicare Other | Source: Ambulatory Visit | Attending: Family Medicine | Admitting: Family Medicine

## 2022-07-05 DIAGNOSIS — E042 Nontoxic multinodular goiter: Secondary | ICD-10-CM

## 2022-07-05 DIAGNOSIS — E041 Nontoxic single thyroid nodule: Secondary | ICD-10-CM | POA: Insufficient documentation

## 2022-07-05 NOTE — Procedures (Signed)
Successful US guided FNA of right inferior thyroid nodule Successful US guided FNA of left inferior thyroid nodule No complications. See PACS for full report.    Alex Gardener, AGNP-BC 07/05/2022, 3:42 PM

## 2022-07-06 LAB — CYTOLOGY - NON PAP

## 2022-07-11 IMAGING — CT CT KNEE*L* W/O CM
1 series · 12 of 14 positions shown, 15 images · non-contrast
Comparison: No prior studies were available for comparison at the
time of dictation.

CLINICAL DATA: Left knee pain after fall 1 day ago. Concern for
tibial plateau fracture on outside radiographs

EXAM:
CT OF THE LEFT KNEE WITHOUT CONTRAST
TECHNIQUE: Multidetector CT imaging of the left knee was performed according to
the standard protocol. Multiplanar CT image reconstructions were
also generated.

[Series 4: knee soft tissue · axial · 0.40mm/px · z∈[-270,-110]mm · 12 of 63 slices shown, 15 images]
[im 5/63  soft-tissue]
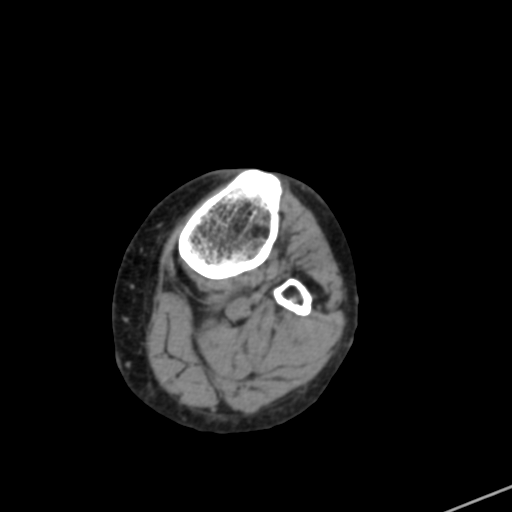
[im 5/63  bone]
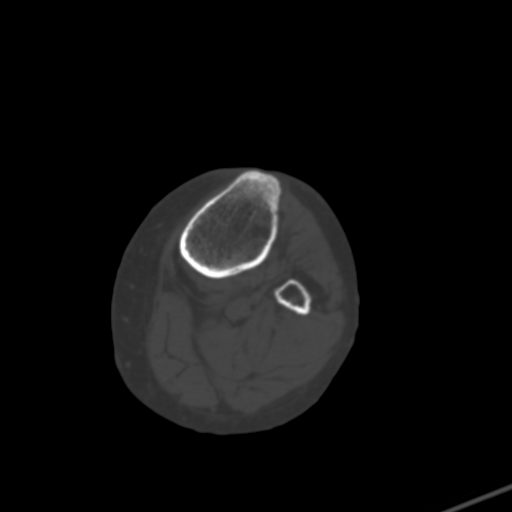
[im 10/63  bone]
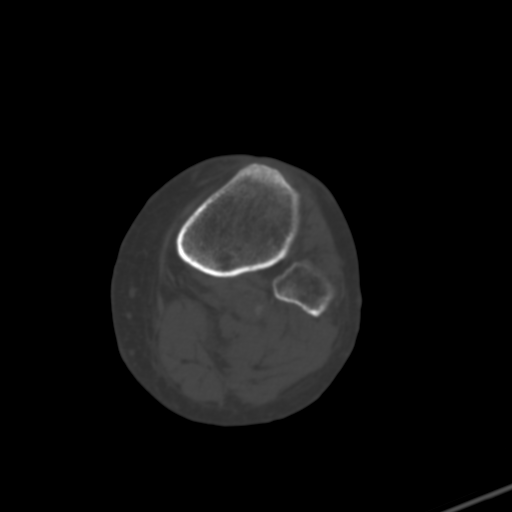
[im 15/63  bone]
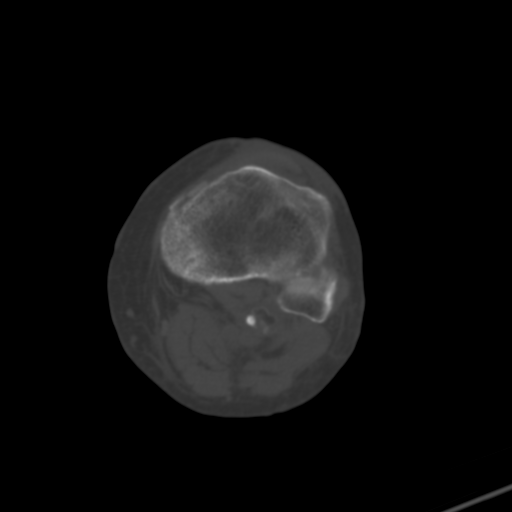
[im 20/63  bone]
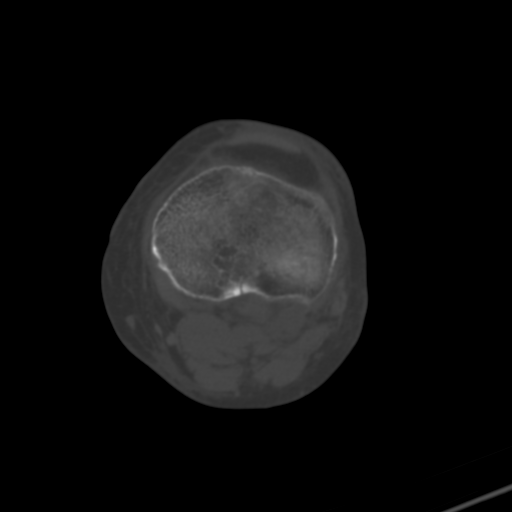
[im 24/63  soft-tissue]
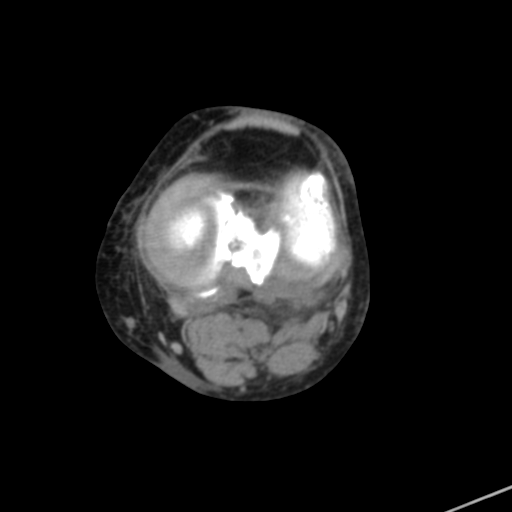
[im 24/63  bone]
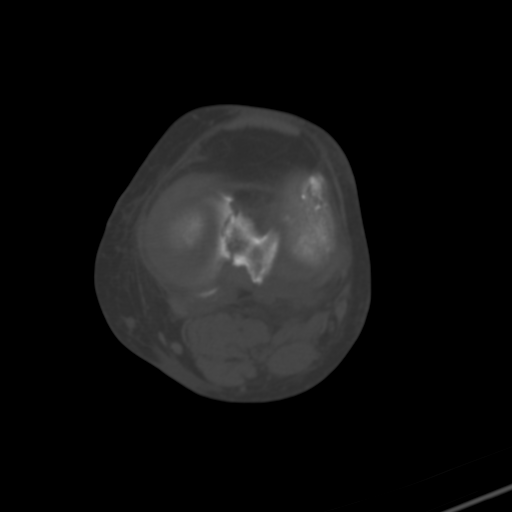
[im 29/63  bone]
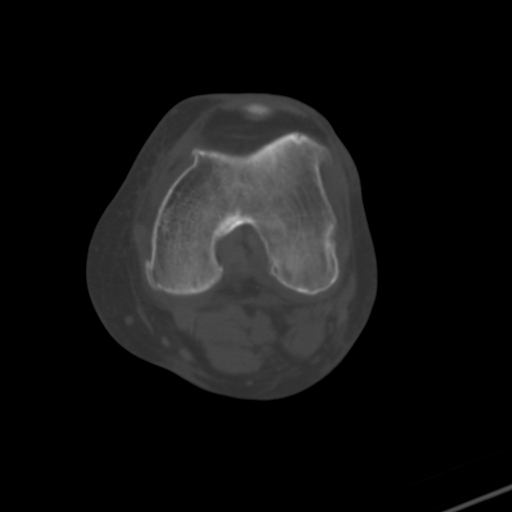
[im 34/63  bone]
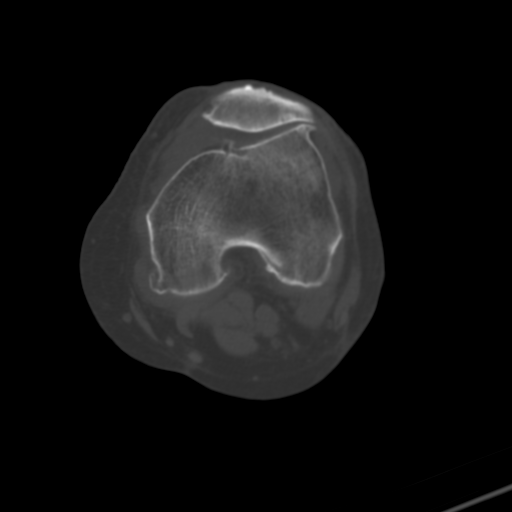
[im 39/63  bone]
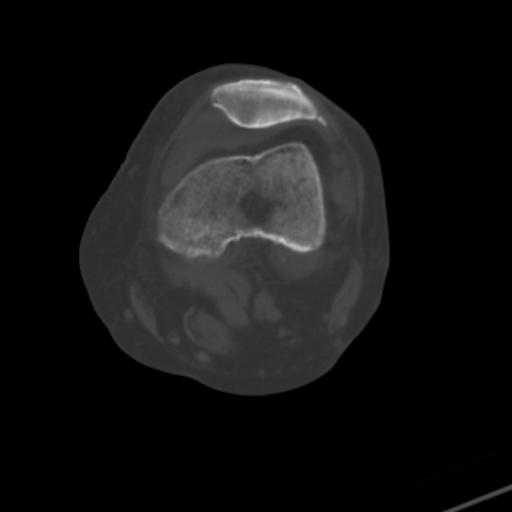
[im 43/63  soft-tissue]
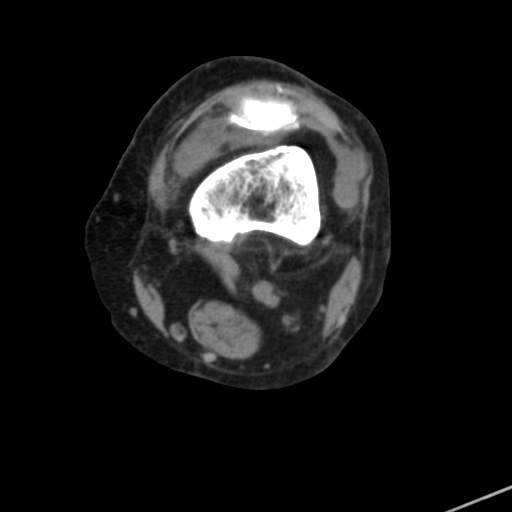
[im 43/63  bone]
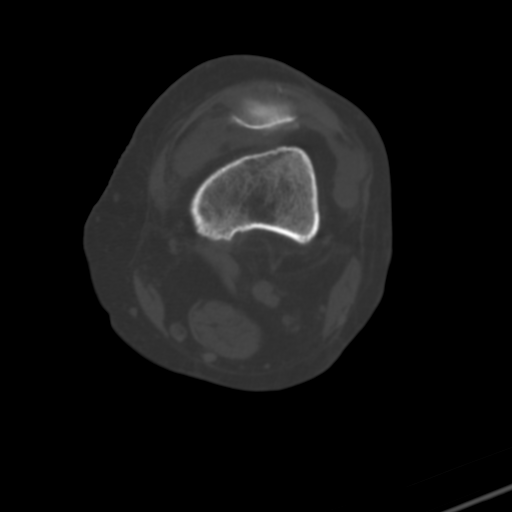
[im 48/63  bone]
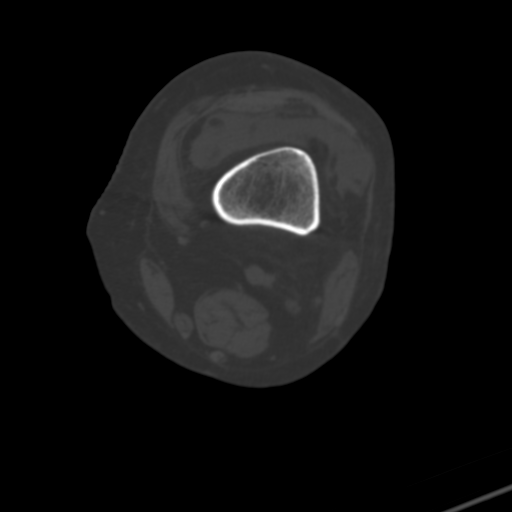
[im 53/63  bone]
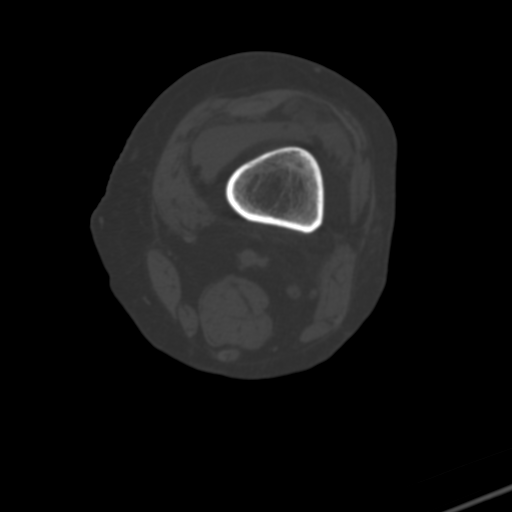
[im 58/63  bone]
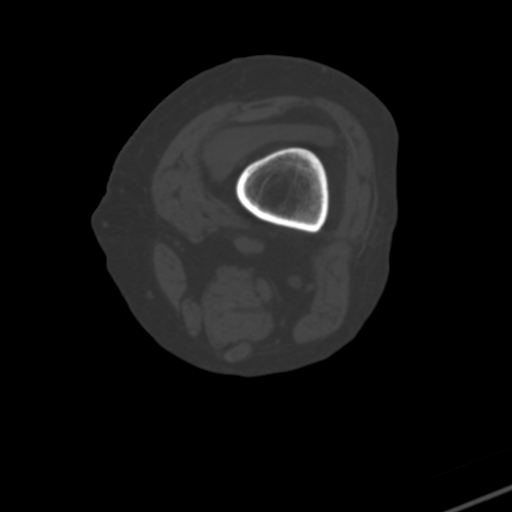

[12 of 14 positions shown; findings below may reference images not displayed]

FINDINGS: Bones/Joint/Cartilage

Acute minimally impacted transverse fracture through the proximal
tibial metaphysis with a nondisplaced vertically oriented fracture
extending into the tibial eminence involving the base of the medial
tibial spine. No displaced fracture. No articular surface
depression. No cortical disruption of the articular surfaces of the
medial or lateral tibial plateau. Large layering lipohemarthrosis.
Fluid distension of the sub popliteal joint recess containing a few
ossified loose bodies. Moderate tricompartmental osteoarthritis,
most pronounced within the patellofemoral compartment. No additional
fracture. No suspicious lytic or sclerotic bone lesion.

Ligaments

Suboptimally assessed by CT.

Muscles and Tendons

No acute musculotendinous abnormality by CT. Extensor mechanism
intact.

Soft tissues

No fluid collection or hematoma.
IMPRESSION: 1. Acute minimally impacted transverse fracture through the proximal
tibial metaphysis with a nondisplaced vertically oriented fracture
extending into the tibial eminence involving the base of the medial
tibial spine. No articular surface depression.
2. Large layering lipohemarthrosis.
3. Moderate tricompartmental osteoarthritis, most pronounced within
the patellofemoral compartment.

## 2022-07-12 LAB — CYTOLOGY - NON PAP

## 2022-08-02 ENCOUNTER — Encounter: Payer: Self-pay | Admitting: Family Medicine

## 2022-08-02 ENCOUNTER — Other Ambulatory Visit: Payer: Medicare Other

## 2022-08-02 DIAGNOSIS — Z5181 Encounter for therapeutic drug level monitoring: Secondary | ICD-10-CM

## 2022-08-02 DIAGNOSIS — E7800 Pure hypercholesterolemia, unspecified: Secondary | ICD-10-CM

## 2022-08-03 LAB — HEPATIC FUNCTION PANEL
ALT: 10 IU/L (ref 0–32)
AST: 16 IU/L (ref 0–40)
Albumin: 4.3 g/dL (ref 3.8–4.8)
Alkaline Phosphatase: 68 IU/L (ref 44–121)
Bilirubin Total: 0.4 mg/dL (ref 0.0–1.2)
Bilirubin, Direct: 0.12 mg/dL (ref 0.00–0.40)
Total Protein: 6.6 g/dL (ref 6.0–8.5)

## 2022-08-03 LAB — LIPID PANEL
Chol/HDL Ratio: 2.6 ratio (ref 0.0–4.4)
Cholesterol, Total: 206 mg/dL — ABNORMAL HIGH (ref 100–199)
HDL: 79 mg/dL (ref >39)
LDL Chol Calc (NIH): 110 mg/dL — ABNORMAL HIGH (ref 0–99)
Triglycerides: 96 mg/dL (ref 0–149)
VLDL Cholesterol Cal: 17 mg/dL (ref 5–40)

## 2022-08-07 DIAGNOSIS — Z961 Presence of intraocular lens: Secondary | ICD-10-CM | POA: Diagnosis not present

## 2022-08-07 DIAGNOSIS — H2511 Age-related nuclear cataract, right eye: Secondary | ICD-10-CM | POA: Diagnosis not present

## 2022-08-07 DIAGNOSIS — H04123 Dry eye syndrome of bilateral lacrimal glands: Secondary | ICD-10-CM | POA: Diagnosis not present

## 2022-08-07 DIAGNOSIS — H35372 Puckering of macula, left eye: Secondary | ICD-10-CM | POA: Diagnosis not present

## 2022-08-07 DIAGNOSIS — H43813 Vitreous degeneration, bilateral: Secondary | ICD-10-CM | POA: Diagnosis not present

## 2022-08-07 DIAGNOSIS — H401122 Primary open-angle glaucoma, left eye, moderate stage: Secondary | ICD-10-CM | POA: Diagnosis not present

## 2022-08-16 ENCOUNTER — Other Ambulatory Visit: Payer: Self-pay | Admitting: Family Medicine

## 2022-08-16 DIAGNOSIS — E7800 Pure hypercholesterolemia, unspecified: Secondary | ICD-10-CM

## 2022-08-16 DIAGNOSIS — I700 Atherosclerosis of aorta: Secondary | ICD-10-CM

## 2022-10-12 DIAGNOSIS — H2511 Age-related nuclear cataract, right eye: Secondary | ICD-10-CM | POA: Diagnosis not present

## 2022-11-17 DIAGNOSIS — L821 Other seborrheic keratosis: Secondary | ICD-10-CM | POA: Diagnosis not present

## 2022-11-17 DIAGNOSIS — L570 Actinic keratosis: Secondary | ICD-10-CM | POA: Diagnosis not present

## 2022-11-17 DIAGNOSIS — R208 Other disturbances of skin sensation: Secondary | ICD-10-CM | POA: Diagnosis not present

## 2022-12-05 IMAGING — CT CT ABD-PELV W/ CM
2 of 5 series · 16 of 46 positions shown, 18 images · IV contrast (OMNIPAQUE)
Comparison: None.

CLINICAL DATA: Generalized abdominal pain, weight loss, change in
bowel habits and anorexia. Prior hysterectomy.

EXAM:
CT ABDOMEN AND PELVIS WITH CONTRAST
TECHNIQUE: Multidetector CT imaging of the abdomen and pelvis was performed
using the standard protocol following bolus administration of
intravenous contrast.

[Series 2: axial st · axial · 0.81mm/px · z∈[+1235,+1575]mm · 13 of 78 slices shown, 15 images]
[im 5/78  soft-tissue]
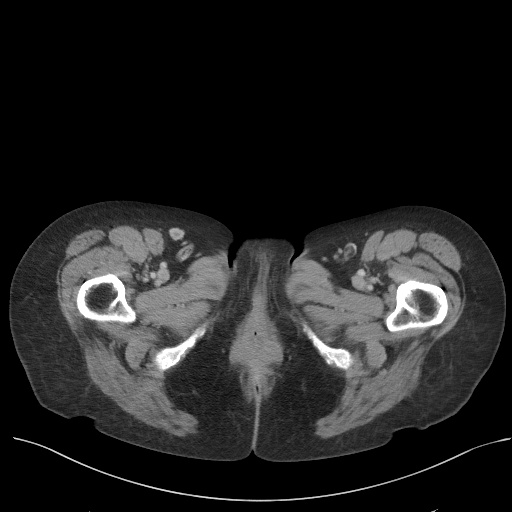
[im 5/78  bone]
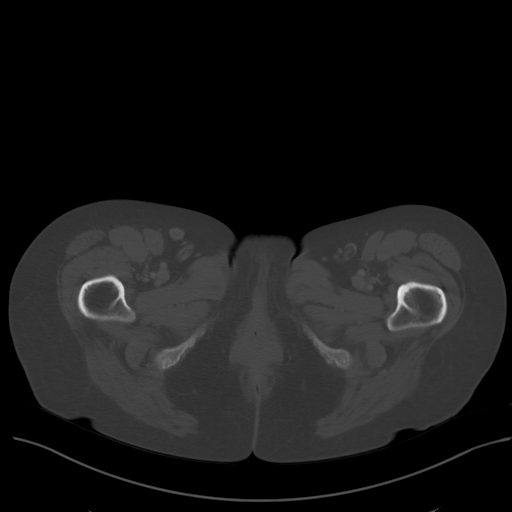
[im 10/78  soft-tissue]
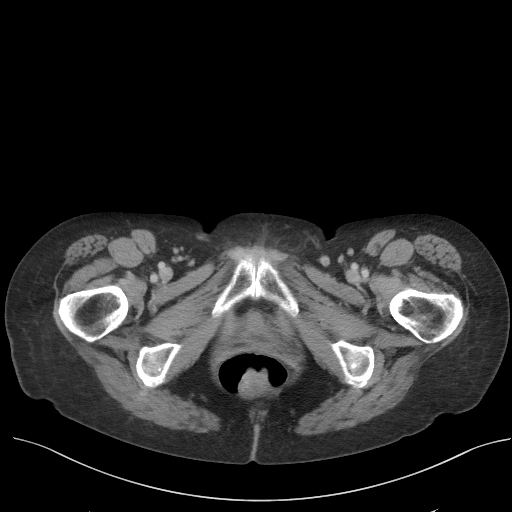
[im 15/78  soft-tissue]
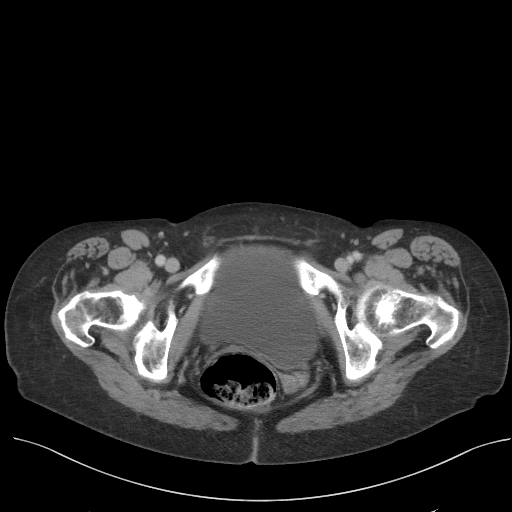
[im 25/78  soft-tissue]
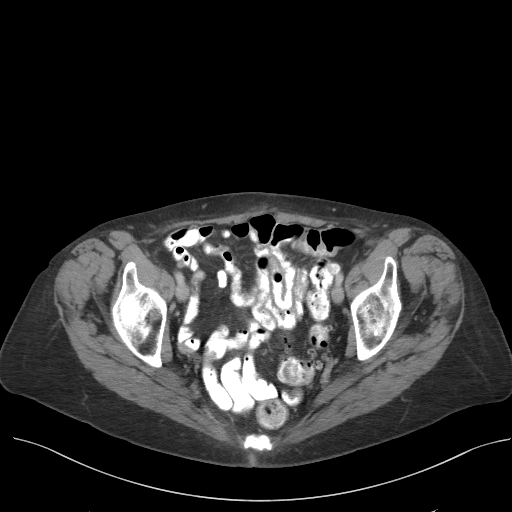
[im 29/78  soft-tissue]
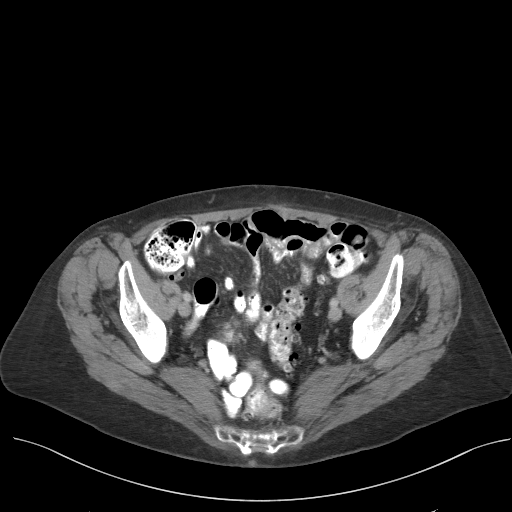
[im 34/78  soft-tissue]
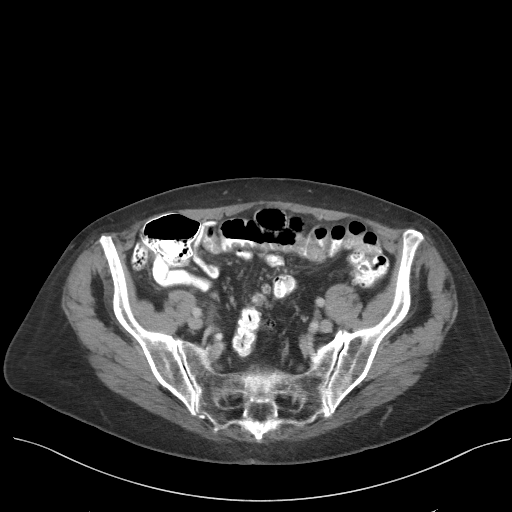
[im 39/78  soft-tissue]
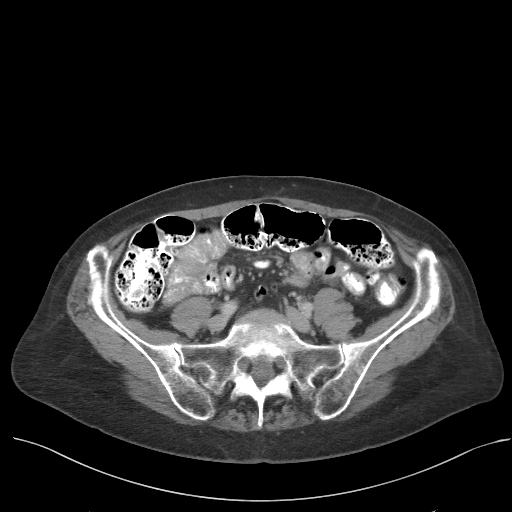
[im 44/78  soft-tissue]
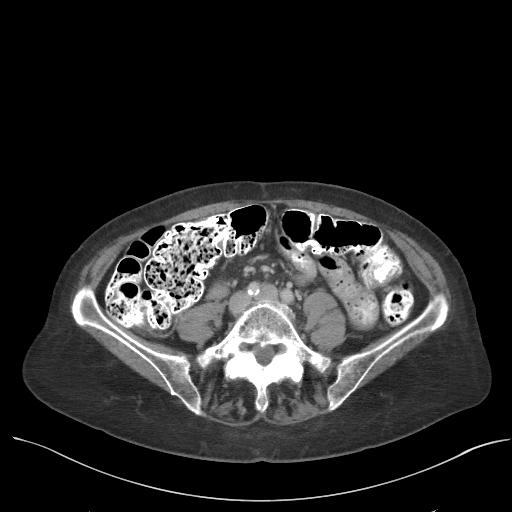
[im 49/78  soft-tissue]
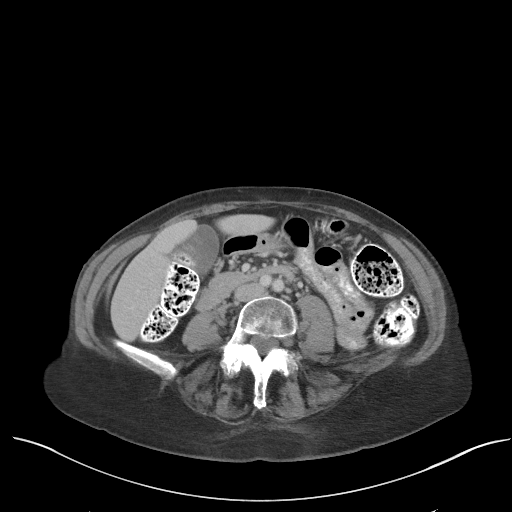
[im 49/78  bone]
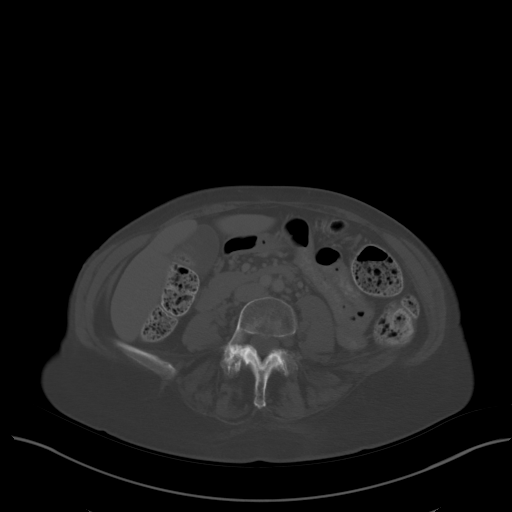
[im 53/78  soft-tissue]
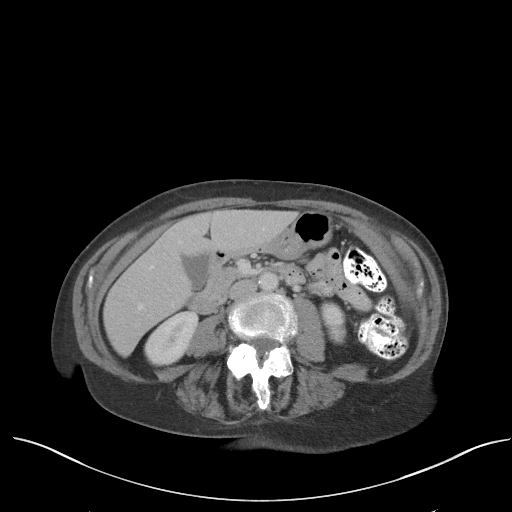
[im 63/78  soft-tissue]
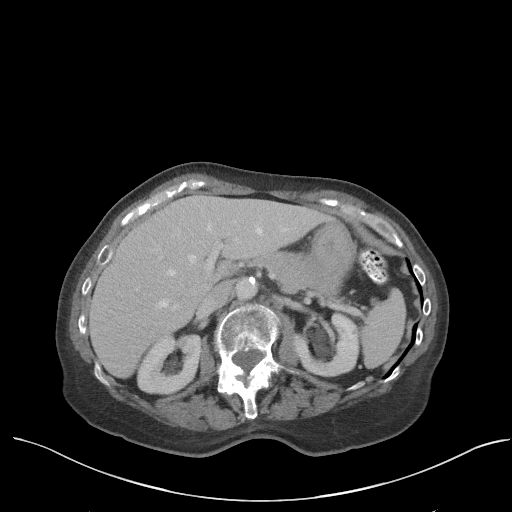
[im 68/78  soft-tissue]
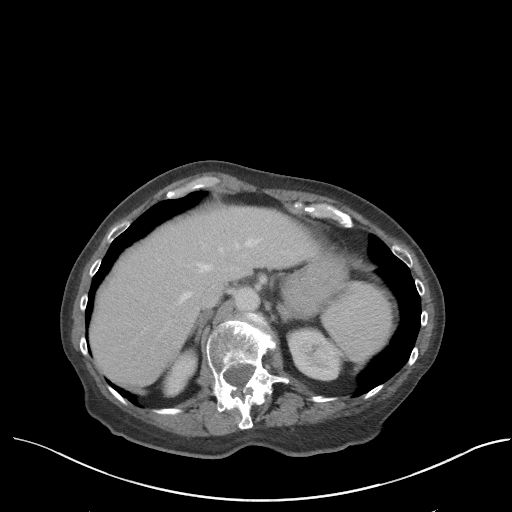
[im 73/78  soft-tissue]
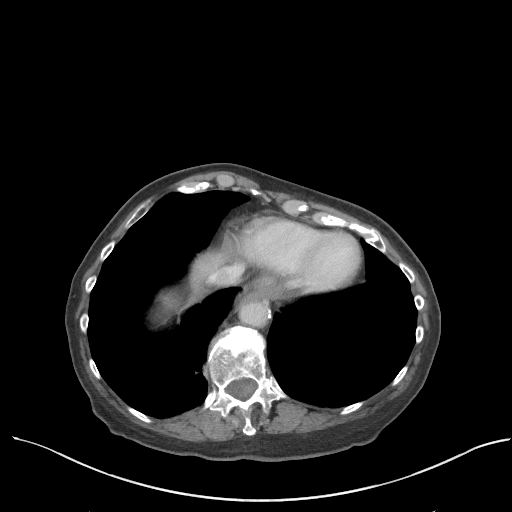

[Series 5: coronal st · coronal · 0.80mm/px · 3 of 69 slices shown]
[im 23/69  soft-tissue]
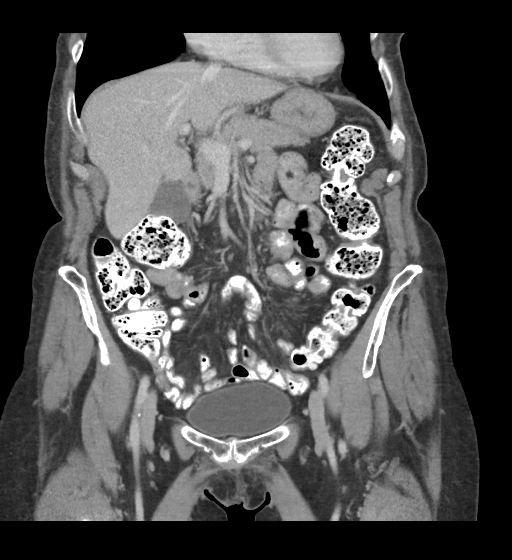
[im 31/69  soft-tissue]
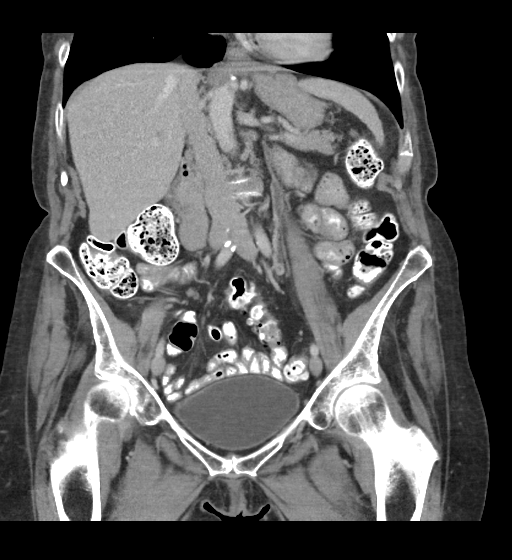
[im 38/69  soft-tissue]
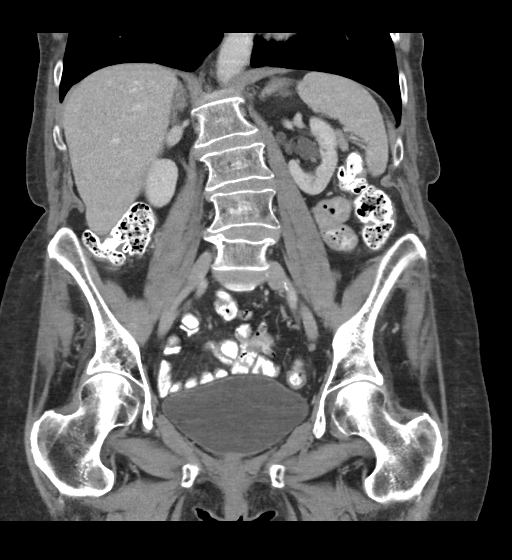

[16 of 46 positions shown; findings below may reference images not displayed]

RADIATION DOSE REDUCTION: This exam was performed according to the
departmental dose-optimization program which includes automated
exposure control, adjustment of the mA and/or kV according to
patient size and/or use of iterative reconstruction technique.

CONTRAST:  80mL OMNIPAQUE IOHEXOL 300 MG/ML  SOLN
FINDINGS: Lower chest: Left lower lobe 4 mm solid pulmonary nodule (series
4/image 10).

Hepatobiliary: Normal liver size. Simple 1.1 cm lateral segment left
liver cyst. No additional liver lesions. Normal gallbladder with no
radiopaque cholelithiasis. No biliary ductal dilatation.

Pancreas: Normal, with no mass or duct dilation.

Spleen: Normal size. No mass.

Adrenals/Urinary Tract: Normal adrenals. No hydronephrosis. Simple
parapelvic renal cysts in the left greater than right kidneys.
Scattered subcentimeter hypodense bilateral renal cortical lesions
are too small to characterize and require no follow-up. Tiny
cystocele. Otherwise normal bladder.

Stomach/Bowel: Normal non-distended stomach. Normal caliber small
bowel with no small bowel wall thickening. Normal appendix. Oral
contrast transits to the rectum. Marked sigmoid diverticulosis with
no large bowel wall thickening or significant pericolonic fat
stranding.

Vascular/Lymphatic: Atherosclerotic nonaneurysmal abdominal aorta.
Patent portal, splenic, hepatic and renal veins. No pathologically
enlarged lymph nodes in the abdomen or pelvis.

Reproductive: Status post hysterectomy, with no abnormal findings at
the vaginal cuff. No adnexal mass.

Other: No pneumoperitoneum, ascites or focal fluid collection.

Musculoskeletal: No aggressive appearing focal osseous lesions. Mild
superior L5 vertebral compression deformity with approximately
10-20% loss of vertebral body height, chronic appearing. Moderate
thoraco lumbar spondylosis.
IMPRESSION: 1. No acute abnormality. No evidence of bowel obstruction or acute
bowel inflammation. Marked sigmoid diverticulosis, with no evidence
of acute diverticulitis.
2. Left lower lobe 4 mm solid pulmonary nodule. No follow-up needed
if patient is low-risk.This recommendation follows the consensus
statement: Guidelines for Management of Incidental Pulmonary Nodules
Detected on CT Images: From the [HOSPITAL] 0807; Radiology
0807; [DATE].
3. No abdominopelvic lymphadenopathy.
4. Tiny cystocele.
5. Aortic Atherosclerosis ([id number]).

## 2022-12-06 ENCOUNTER — Encounter: Payer: Self-pay | Admitting: Pulmonary Disease

## 2022-12-19 ENCOUNTER — Ambulatory Visit
Admission: RE | Admit: 2022-12-19 | Discharge: 2022-12-19 | Disposition: A | Discharge status: Home / Self Care | Payer: Medicare Other | Source: Ambulatory Visit | Attending: Pulmonary Disease

## 2022-12-19 DIAGNOSIS — R918 Other nonspecific abnormal finding of lung field: Secondary | ICD-10-CM | POA: Diagnosis not present

## 2022-12-19 DIAGNOSIS — I700 Atherosclerosis of aorta: Secondary | ICD-10-CM | POA: Diagnosis not present

## 2023-01-01 DIAGNOSIS — Z1231 Encounter for screening mammogram for malignant neoplasm of breast: Secondary | ICD-10-CM | POA: Diagnosis not present

## 2023-01-01 LAB — HM MAMMOGRAPHY

## 2023-01-02 ENCOUNTER — Encounter: Payer: Self-pay | Admitting: Acute Care

## 2023-01-02 ENCOUNTER — Ambulatory Visit (INDEPENDENT_AMBULATORY_CARE_PROVIDER_SITE_OTHER): Payer: Medicare Other | Admitting: Acute Care

## 2023-01-02 VITALS — BP 160/75 | HR 67 | Temp 97.9°F | Ht 68.0 in | Wt 157.0 lb

## 2023-01-02 DIAGNOSIS — R918 Other nonspecific abnormal finding of lung field: Secondary | ICD-10-CM | POA: Diagnosis not present

## 2023-01-02 DIAGNOSIS — Z23 Encounter for immunization: Secondary | ICD-10-CM | POA: Diagnosis not present

## 2023-01-02 NOTE — Progress Notes (Signed)
History of Present Illness Joan Morales is a 82 y.o. female never smoker ( but + second hand smoke exposure) with past medical history of allergic rhinitis, gastroesophageal reflux, ovarian teratoma, osteopenia.She was referred to see Dr. Tonia Brooms for an abnormal CT Chest 04/2022.   Synopsis 81 year old female, past medical history of allergic rhinitis, gastroesophageal reflux, ovarian teratoma, osteopenia. Patient is a lifelong non-smoker.She was referred after having a CT scan of the chest in February 2024. This revealed diffuse areas of groundglass a few tree-in-bud abnormalities. Also a 7 mm left lower lobe pulmonary nodule and a 8 mm groundglass opacity in the posterior right upper lobe. Patient has not had a previous axial CT imaging in the past for comparison. Imaging was consistent with mild bronchiectasis. She was seen by Dr. Tonia Brooms 06/28/2022 and plan was for  follow up CT Chest in 6 months to re-evaluate these nodules, and to monitor her for recurrent exacerbations Of bronchiectasis. Dr. Tonia Brooms prescribed albuterol for bronchodilation. If she had progression her suggested PFT's and respiratory cultures.    01/02/2023 Pt. Presents for follow up after CT Chest without contrast to re-evaluate pulmonary nodules. She states she has been doing well.Her Ct imaging shows that there has been interval resolution of the right lower lobe tree in bud ground glass nodules,  the right upper lobe ground glass nodule, and the 5 mm left lower lobe nodule is stable. Radiology do not recommend any follow up as patient is low risk as a non smoker.  She states she has not had any upper respiratory issues. We discussed a 12 month follow up scan , but patient does not feel she needs this. I have asked her to follow up with [name] for any respiratory issues in the future. We will giver her flu shot today.   Her BP was elevated both times it was checked today, at the beginning of her visit and again after visit was complete.  Last check BP was 160/75. She states that she gets nervous driving. I have asked her to check her BP at home, and if it remains elevated ( Greater than 140 systolic or greater than 80 diastolic to follow up with her PCP. She verbalized understanding.    Test Results: CT chest 12/19/2022 Interval resolution of right lower lobe tree-in-bud ground-glass nodules. Interval resolution of right upper lobe ground-glass nodule. Stable solid pulmonary nodules, largest measures 5 mm. No follow-up needed if patient is low-risk. Aortic Atherosclerosis     Latest Ref Rng & Units 05/03/2022   11:09 AM 02/28/2021    1:45 PM 12/29/2019   11:56 AM  CBC  WBC 3.4 - 10.8 x10E3/uL 7.0  9.5  7.2   Hemoglobin 11.1 - 15.9 g/dL 25.9  56.3  87.5   Hematocrit 34.0 - 46.6 % 41.8  38.2  41.4   Platelets 150 - 450 x10E3/uL 265  315  277        Latest Ref Rng & Units 05/03/2022   11:09 AM 05/27/2021   11:36 AM 02/28/2021    1:45 PM  BMP  Glucose 70 - 99 mg/dL 93   91   BUN 8 - 27 mg/dL 15   9   Creatinine 6.43 - 1.00 mg/dL 3.29  5.18  8.41   BUN/Creat Ratio 12 - 28 19   13    Sodium 134 - 144 mmol/L 140   141   Potassium 3.5 - 5.2 mmol/L 4.1   4.1   Chloride 96 - 106 mmol/L 101  102   CO2 20 - 29 mmol/L 24   22   Calcium 8.7 - 10.3 mg/dL 9.6   9.4     BNP No results found for: "BNP"  ProBNP No results found for: "PROBNP"  PFT No results found for: "FEV1PRE", "FEV1POST", "FVCPRE", "FVCPOST", "TLC", "DLCOUNC", "PREFEV1FVCRT", "PSTFEV1FVCRT"  CT CHEST WO CONTRAST  Result Date: 01/01/2023 CLINICAL DATA:  Lung nodule EXAM: CT CHEST WITHOUT CONTRAST TECHNIQUE: Multidetector CT imaging of the chest was performed following the standard protocol without IV contrast. RADIATION DOSE REDUCTION: This exam was performed according to the departmental dose-optimization program which includes automated exposure control, adjustment of the mA and/or kV according to patient size and/or use of iterative reconstruction  technique. COMPARISON:  CT chest dated May 05, 2022; thyroid ultrasound dated June 07, 2022 FINDINGS: Cardiovascular: Normal heart size. No pericardial effusion. Normal caliber thoracic aorta with mild atherosclerotic disease. Mediastinum/Nodes: Small hiatal hernia. Multiple thyroid nodules, see thyroid ultrasound dated June 07, 2022. Stable mildly enlarged precarinal lymph node measuring 12 mm in short axis, unchanged. Lungs/Pleura: Central airways are patent. Stable biapical pleural-parenchymal scarring. Mild linear opacities of the right-greater-than-left lung bases, slightly decreased when compared with the prior exam likely due to scarring or atelectasis. Interval resolution of ground-glass nodule of the right upper lobe. Solid pulmonary nodules are stable. Reference solid nodule of the left lower lobe measuring 5 mm. Numerous tree-in-bud ground-glass nodules of the right lower lobe seen on prior exam have resolved. No pleural effusion or pneumothorax. Upper Abdomen: Left-sided renal sinus cysts, no specific follow-up imaging is necessary. Musculoskeletal: No chest wall mass or suspicious bone lesions identified. IMPRESSION: 1. Interval resolution of right lower lobe tree-in-bud ground-glass nodules. 2. Interval resolution of right upper lobe ground-glass nodule. 3. Stable solid pulmonary nodules, largest measures 5 mm. No follow-up needed if patient is low-risk.This recommendation follows the consensus statement: Guidelines for Management of Incidental Pulmonary Nodules Detected on CT Images: From the Fleischner Society 2017; Radiology 2017; 284:228-243. 4. Aortic Atherosclerosis (ICD10-I70.0). Electronically Signed   By: Allegra Lai M.D.   On: 01/01/2023 10:23     Past medical hx Past Medical History:  Diagnosis Date   Allergic rhinitis, cause unspecified    Arthritis    "hands" (03/10/2014)   Cataract    GERD (gastroesophageal reflux disease)    Metatarsal fracture 12/30/2020   right  5th   Osteopenia    fosamax started 01/2012   Ovarian teratoma    left, removed 12/06   PONV (postoperative nausea and vomiting)    Pure hypercholesterolemia    Shingles 03/2018   dx'd while in [facility], on right side of mouth, nose, lip   Tibial plateau fracture, left 12/30/2020     Social History   Tobacco Use   Smoking status: Never   Smokeless tobacco: Never  Vaping Use   Vaping status: Never Used  Substance Use Topics   Alcohol use: No   Drug use: No    Ms.Quinonez reports that she has never smoked. She has never used smokeless tobacco. She reports that she does not drink alcohol and does not use drugs.  Tobacco Cessation: Never smoker + second hand smoke exposure   Past surgical hx, Family hx, Social hx all reviewed.  Current Outpatient Medications on File Prior to Visit  Medication Sig   albuterol (VENTOLIN HFA) 108 (90 Base) MCG/ACT inhaler Inhale 1-2 puffs into the lungs every 6 (six) hours as needed for wheezing or shortness of breath.   Ascorbic  Acid (VITAMIN C) 1000 MG tablet Take 1,000 mg by mouth daily.   Calcium Carbonate (CALCIUM 600 PO) Take 1 tablet by mouth daily.   cyanocobalamin (VITAMIN B12) 1000 MCG tablet Take 1,000 mcg by mouth daily.   Glucosamine-Vit D-Hyaluron Acd (GLUCOSAMINE PLUS VITAMIN D3 PO) Take 1 tablet by mouth daily.   mometasone (NASONEX) 50 MCG/ACT nasal spray Place 1 spray into the nose daily.   Omega-3 Fatty Acids (FISH OIL) 500 MG CAPS Take 1 capsule by mouth daily.   rosuvastatin (CRESTOR) 10 MG tablet TAKE 1 TABLET BY MOUTH EVERY DAY   vitamin E 180 MG (400 UNITS) capsule Take 400 Units by mouth daily.   No current facility-administered medications on file prior to visit.     Allergies  Allergen Reactions   Erythromycin Swelling   Triple Antibiotic [Bacitracin-Neomycin-Polymyxin] Swelling    Blistering    Review Of Systems:  Constitutional:   No  weight loss, night sweats,  Fevers, chills, fatigue, or   lassitude.  HEENT:   No headaches,  Difficulty swallowing,  Tooth/dental problems, or  Sore throat,                No sneezing, itching, ear ache, nasal congestion, post nasal drip,   CV:  No chest pain,  Orthopnea, PND, swelling in lower extremities, anasarca, dizziness, palpitations, syncope.   GI  No heartburn, indigestion, abdominal pain, nausea, vomiting, diarrhea, change in bowel habits, loss of appetite, bloody stools.   Resp: No shortness of breath with exertion or at rest.  No excess mucus, no productive cough,  No non-productive cough,  No coughing up of blood.  No change in color of mucus.  No wheezing.  No chest wall deformity  Skin: no rash or lesions.  GU: no dysuria, change in color of urine, no urgency or frequency.  No flank pain, no hematuria   MS:  No joint pain or swelling.  No decreased range of motion.  No back pain.  Psych:  No change in mood or affect. No depression or anxiety.  No memory loss.   Vital Signs BP (!) 160/75 (BP Location: Left Arm, Cuff Size: Normal) Comment: provider notified  Pulse 67   Temp 97.9 F (36.6 C) (Oral)   Ht 5\' 8"  (1.727 m)   Wt 157 lb (71.2 kg)   SpO2 96% Comment: on RA  BMI 23.87 kg/m    Physical Exam:  General- No distress,  A&Ox3, pleasant ENT: No sinus tenderness, TM clear, pale nasal mucosa, no oral exudate,no post nasal drip, no LAN Cardiac: S1, S2, regular rate and rhythm, no murmur Chest: No wheeze/ rales/ dullness; no accessory muscle use, no nasal flaring, no sternal retractions Abd.: Soft Non-tender, ND, BS +, Body mass index is 23.87 kg/m.  Ext: No clubbing cyanosis, edema Neuro:  normal strength, MAE x 4, A&O x 3 Skin: No rashes, warm and dry, no lesions  Psych: normal mood and behavior   Assessment/Plan Abnormal Chest imaging 04/2022 after respiratory illness.  Plan Your CT scan shows a stable 5 mm lung nodule, and interval resolution of the right lower lobe tree in bud ground glass nodules, and the  right upper lobe ground glass nodule.  This is great news.  Radiology do not recommend any further follow up . Please call us to be seen if you develop any further respiratory issues.  Flu shot today. You BP was elevated today in the office. Please check this at home when you are not anxious  and if it remains above 140/ 80 please follow up with your PCP.  Please contact office for sooner follow up if symptoms do not improve or worsen or seek emergency care    I spent 30 minutes dedicated to the care of this patient on the date of this encounter to include pre-visit review of records, face-to-face time with the patient discussing conditions above, post visit ordering of testing, clinical documentation with the electronic health record, making appropriate referrals as documented, and communicating necessary information to the patient's healthcare team.     Bevelyn Ngo, NP 01/02/2023  9:36 AM

## 2023-01-02 NOTE — Patient Instructions (Addendum)
It is good to see you today. Your CT scan shows a stable 5 mm lung nodule, and interval resolution of the right lower lobe tree in bud ground glass nodules, and the right upper lobe ground glass nodule.  This is great news.  Radiology do not recommend any further follow up . Please call us to be seen if you develop any further respiratory issues.  Flu shot today. You BP was elevated today in the office. Please check this at home when you are not anxious and if it remains above 140/ 80 please follow up with your PCP.  Please contact office for sooner follow up if symptoms do not improve or worsen or seek emergency care

## 2023-01-03 ENCOUNTER — Ambulatory Visit: Payer: Medicare Other | Admitting: Pulmonary Disease

## 2023-01-04 ENCOUNTER — Encounter: Payer: Self-pay | Admitting: *Deleted

## 2023-01-09 NOTE — Progress Notes (Signed)
Reviewed at last OV with SG  Thanks,  BLI  Josephine Igo, DO [facility] 01/09/2023 3:30 PM

## 2023-02-07 ENCOUNTER — Other Ambulatory Visit: Payer: Self-pay | Admitting: Family Medicine

## 2023-02-07 DIAGNOSIS — E7800 Pure hypercholesterolemia, unspecified: Secondary | ICD-10-CM

## 2023-02-07 DIAGNOSIS — I700 Atherosclerosis of aorta: Secondary | ICD-10-CM

## 2023-02-23 ENCOUNTER — Encounter (INDEPENDENT_AMBULATORY_CARE_PROVIDER_SITE_OTHER): Payer: Medicare Other | Admitting: Ophthalmology

## 2023-02-23 ENCOUNTER — Ambulatory Visit (INDEPENDENT_AMBULATORY_CARE_PROVIDER_SITE_OTHER): Payer: Medicare Other | Admitting: Medical

## 2023-02-23 VITALS — BP 130/72 | HR 75 | Temp 97.3°F | Wt 157.6 lb

## 2023-02-23 DIAGNOSIS — H33301 Unspecified retinal break, right eye: Secondary | ICD-10-CM

## 2023-02-23 DIAGNOSIS — L089 Local infection of the skin and subcutaneous tissue, unspecified: Secondary | ICD-10-CM | POA: Diagnosis not present

## 2023-02-23 DIAGNOSIS — H338 Other retinal detachments: Secondary | ICD-10-CM | POA: Diagnosis not present

## 2023-02-23 DIAGNOSIS — H43813 Vitreous degeneration, bilateral: Secondary | ICD-10-CM | POA: Diagnosis not present

## 2023-02-23 DIAGNOSIS — W548XXA Other contact with dog, initial encounter: Secondary | ICD-10-CM

## 2023-02-23 DIAGNOSIS — H35372 Puckering of macula, left eye: Secondary | ICD-10-CM

## 2023-02-23 MED ORDER — SILVER SULFADIAZINE 1 % EX CREA: 1.0000 | TOPICAL_CREAM | Freq: Every day | CUTANEOUS | 0 refills | Status: DC

## 2023-02-23 MED ORDER — AMOXICILLIN-POT CLAVULANATE 875-125 MG PO TABS
1.0000 | ORAL_TABLET | Freq: Two times a day (BID) | ORAL | 0 refills | Status: DC
Start: 2023-02-23 — End: 2023-06-25

## 2023-02-23 NOTE — Progress Notes (Addendum)
Subjective:  Joan Morales is a 82 y.o. female who presents for Chief Complaint  Patient presents with   Wound Infection    Wound won't heal. Happen 3 weeks ago from his sons dog claw from playing with him     Here for wound.  3 weeks ago she was playing with her son's dog and the dog scratched her right hand.  She is right-handed.  She developed a small wound after the fact.  She may get a clean to better initially.  She has been using topical antibiotic ointment but over the last 3 weeks it just will not heal up completely.  She pulled the scab off and there is a little bit of pus drainage.  No major pain or redness but it is some discomfort.  No fever body aches or chills.  No other aggravating or relieving factors.    No other c/o.  The following portions of the patient's history were reviewed and updated as appropriate: allergies, current medications, past family history, past medical history, past social history, past surgical history and problem list.  ROS Otherwise as in subjective above  Objective: BP 130/72   Pulse 75   Temp (!) 97.3 F (36.3 C)   Wt 157 lb 9.6 oz (71.5 kg)   BMI 23.96 kg/m   General appearance: alert, no distress, well developed, well nourished Right hand posterior surface between the thumb and first finger at the base of the thumb with a small abrasion with some yellowish discharge/crusted debris, mild pinkish coloration surrounding the area but no fluctuance, no warmth, no induration. Thumb and fingers and hand nontender with normal range of motion.  Significant arthritic changes noted of the fingers in general Hand and fingers neurovascularly intact   Assessment: Encounter Diagnoses  Name Primary?   Skin infection Yes   Dog scratch      Plan: Begin oral antibiotic below time 7 to 10 days, clean the hand twice daily with soap and water.  Use Silvadene cream topically twice daily.   I have to clean her hand while she was here with soap and water.   We applied Silvadene cream and bandage today  If not much improved within 3 to 5 days then recheck.  If not completely resolved within 7 to 10 days and follow-up otherwise.  If any new symptoms or signs of infection as discussed , then get reevaluated right away  Lee was seen today for wound infection.  Diagnoses and all orders for this visit:  Skin infection  Dog scratch  Other orders -     amoxicillin-clavulanate (AUGMENTIN) 875-125 MG tablet; Take 1 tablet by mouth 2 (two) times daily. -     silver sulfADIAZINE (SILVADENE) 1 % cream; Apply 1 Application topically daily.    Follow up: prn

## 2023-02-24 ENCOUNTER — Ambulatory Visit: Payer: Self-pay

## 2023-05-05 ENCOUNTER — Other Ambulatory Visit: Payer: Self-pay | Admitting: Family Medicine

## 2023-05-05 DIAGNOSIS — I700 Atherosclerosis of aorta: Secondary | ICD-10-CM

## 2023-05-05 DIAGNOSIS — E7800 Pure hypercholesterolemia, unspecified: Secondary | ICD-10-CM

## 2023-05-09 ENCOUNTER — Ambulatory Visit: Payer: Medicare Other | Admitting: Family Medicine

## 2023-05-17 ENCOUNTER — Other Ambulatory Visit: Payer: Self-pay | Admitting: Family Medicine

## 2023-05-17 DIAGNOSIS — E7800 Pure hypercholesterolemia, unspecified: Secondary | ICD-10-CM

## 2023-05-17 DIAGNOSIS — I700 Atherosclerosis of aorta: Secondary | ICD-10-CM

## 2023-05-17 MED ORDER — ROSUVASTATIN CALCIUM 10 MG PO TABS: 10.0000 mg | ORAL_TABLET | Freq: Every day | ORAL | 0 refills | Status: DC

## 2023-05-17 NOTE — Telephone Encounter (Signed)
 Copied from CRM [id number]. Topic: Clinical - Medication Refill >> May 17, 2023  3:41 PM Clide Dales wrote: Most Recent Primary Care Visit:  Provider: Jac Canavan  Department: [facility]  Visit Type: ACUTE  Date: 02/23/2023  Medication: rosuvastatin (CRESTOR) 10 MG tablet  Has the patient contacted their pharmacy? Yes (Agent: If no, request that the patient contact the pharmacy for the refill. If patient does not wish to contact the pharmacy document the reason why and proceed with request.) (Agent: If yes, when and what did the pharmacy advise?)  Is this the correct pharmacy for this prescription? Yes If no, delete pharmacy and type the correct one.  This is the patient's preferred pharmacy:  [facility] [address] [place] - [address] [place] [address] Phone: [phone] Fax: [phone]   Has the prescription been filled recently? Yes  Is the patient out of the medication? Yes  Has the patient been seen for an appointment in the last year OR does the patient have an upcoming appointment? Yes  Can we respond through MyChart? Yes  Agent: Please be advised that Rx refills may take up to 3 business days. We ask that you follow-up with your pharmacy.

## 2023-06-21 ENCOUNTER — Telehealth: Payer: Self-pay | Admitting: Family Medicine

## 2023-06-21 NOTE — Telephone Encounter (Signed)
 Pt advised to fast 6 hours prior to appt 06/25/23

## 2023-06-21 NOTE — Telephone Encounter (Signed)
 Pt has appt Monday and wants to know if she needs to fast

## 2023-06-21 NOTE — Telephone Encounter (Signed)
 Yes, she should be fasting.  For at least 6 hours (can eat an early breakfast and skip lunch so she doesn't have to fast all day)

## 2023-06-24 NOTE — Patient Instructions (Incomplete)
 HEALTH MAINTENANCE RECOMMENDATIONS:  It is recommended that you get at least 30 minutes of aerobic exercise at least 5 days/week (for weight loss, you may need as much as 60-90 minutes). This can be any activity that gets your heart rate up. This can be divided in 10-15 minute intervals if needed, but try and build up your endurance at least once a week.  Weight bearing exercise is also recommended twice weekly.  Eat a healthy diet with lots of vegetables, fruits and fiber.  "Colorful" foods have a lot of vitamins (ie green vegetables, tomatoes, red peppers, etc).  Limit sweet tea, regular sodas and alcoholic beverages, all of which has a lot of calories and sugar.  Up to 1 alcoholic drink daily may be beneficial for women (unless trying to lose weight, watch sugars).  Drink a lot of water.  Calcium recommendations are 1200-1500 mg daily (1500 mg for postmenopausal women or women without ovaries), and vitamin D 1000 IU daily.  This should be obtained from diet and/or supplements (vitamins), and calcium should not be taken all at once, but in divided doses.  Monthly self breast exams and yearly mammograms for women over the age of 22 is recommended.  Sunscreen of at least SPF 30 should be used on all sun-exposed parts of the skin when outside between the hours of 10 am and 4 pm (not just when at beach or pool, but even with exercise, golf, tennis, and yard work!)  Use a sunscreen that says "broad spectrum" so it covers both UVA and UVB rays, and make sure to reapply every 1-2 hours.  Remember to change the batteries in your smoke detectors when changing your clock times in the spring and fall. Carbon monoxide detectors are recommended for your home.  Use your seat belt every time you are in a car, and please drive safely and not be distracted with cell phones and texting while driving.   Joan Morales , Thank you for taking time to come for your Medicare Wellness Visit. I appreciate your ongoing  commitment to your health goals. Please review the following plan we discussed and let me know if I can assist you in the future.   This is a list of the screening recommended for you and due dates:  Health Maintenance  Topic Date Due   COVID-19 Vaccine (5 - 2024-25 season) 11/19/2022   Flu Shot  10/19/2023   Mammogram  01/01/2024   Medicare Annual Wellness Visit  06/24/2024   DTaP/Tdap/Td vaccine (3 - Td or Tdap) 10/19/2025   Pneumonia Vaccine  Completed   DEXA scan (bone density measurement)  Completed   Zoster (Shingles) Vaccine  Completed   HPV Vaccine  Aged Out   Hepatitis C Screening  Discontinued    RSV vaccine is recommended. At this point, it makes most sense to get this in the Fall. You need to get this from the pharmacy. You can get it the same day as the flu shot.  COVID boosters are recommended every 6 months for those over 65.  You are due for a follow-up thyroid ultrasound.  Someone will be contacting your to schedule this (let us know if they don't).  Bone density test is due at the end of April. Please contact [facility] to schedule this. You may wait until you get your mammogram there in October, scheduling them for the same day, if that is easier for you. They should be able to schedule your exams when you call, and fax  my office the order for the bone density test to be signed.  Please bring [name] copies of your Living Will and Healthcare Power of Attorney  so that it can be scanned into your medical chart.

## 2023-06-24 NOTE — Progress Notes (Unsigned)
 No chief complaint on file.  Joan Morales is a 83 y.o. female who presents for annual wellness visit and follow-up on chronic medical conditions.    She saw Vincenza Hews for a skin infection of her right hand (from a scratch from her son's dog) in December.  She was treated with augmentin and silvadene.  She was referred to pulmonary for abnormal CT in 04/2022 showing diffuse areas of groundglass, a few tree-in-bud abnormalities. Also a 7 mm left lower lobe pulmonary nodule and a 8 mm groundglass opacity in the posterior right upper lobe.  She had f/u with pulmonary in October 2024, after she had a CT scan repeated. This showed interval resolution of the right lower lobe tree in bud ground glass nodules, and the right upper lobe ground glass nodule. The 5 mm left lower lobe nodule was stable, and no further monitoring was recommended.  An incidental thyroid nodule was noted on the 04/2022 CT scan. She had thyroid US performed in 05/2022 with findings suggestive of multinodular goiter. 2 nodules were recommended for biopsy, and 1 recommended for 1 year follow-up. She underwent US-guided FNA biopsies of the nodules in 06/2022. Biopsy of R inferior nodule showed scant follicular epithelium present (Bethesda category I). Biopsy of L inferior nodule was consistent with benign follicular nodule (Bethesda category II). She is due for f/u thyroid US.  Osteopenia:  She started on fosamax in 02/2012.  Initial T-score was T-2.4, and high risk FRAX score (hip and elsewhere).  She started having problems with her right jaw, and stopped taking the alendronate in 09/2016.  DEXA in 11/21/2018: T-2 at R fem neck, -1.3 at L fem neck, -1.6 at total L femur; reportedly stable at hips.  Repeat DEXA 06/2021: T-1.8 at R femoral neck. She is due for recheck. At one point (02/2021) she had stopped taking her calcium supplement and vitamin D when she got sick with diarrhea.  She has been back on her calcium and vitamin D since her  diarrhea resolved  (>1 year). Not getting any weight-bearing exercise. ***  Mild hyperlipidemia and aortic atherosclerosis:  She was started on rosuvastatin 10mg  in 04/2022, after discussing her atherosclerosis, and she had an LDL of 151.  Follow-up lipids in 07/2022 were improved, with LDL 110. LDL was above goal, but she hadn't taken the rosuvastatin for at least 10d prior to having those labs checked. She had developed a rash (wasn't evaluated for this, told us after the fact).  She was advised to re-start the statin (and contact us if she had any rash or other reaction). We kept the dose the same, and she is due for recheck today. She denies side effects to the medication.  Diet hasn't changed from last year: 4-5 eggs/week, mostly just the egg whites. She no longer eats any red meat, mostly chicken.  No longer has any bacon or sausage. She eats some cheese (daily)  Component Ref Range & Units (hover) 10 mo ago 1 yr ago 2 yr ago 3 yr ago 4 yr ago 5 yr ago 6 yr ago  Cholesterol, Total 206 High  254 High  181 248 High  238 High  228 High    Triglycerides 96 93 75 102 90 92 70 R  HDL 79 87 56 76 78 76 81 R  VLDL Cholesterol Cal 17 16 14 18 15 18    LDL Chol Calc (NIH) 110 High  151 High  111 High  154 High  145 High  Chol/HDL Ratio 2.6 2.9 CM 3.2 CM 3.3 CM 3.1 CM 3.0 CM 3.0 R    H/o GI problems--Bowel changes/diarrhea, decreased appetite and weight loss. She was evaluated by Dr. Christella Hartigan with CT and endoscopies. 04/2021 EGD--mild inflammation at gastric antrum. Biopsy--mucosa with mild nonspecific reactive gastropathy. 04/2021 Colonoscopy--diverticulosis, benign biopsies. CT 05/2021 showed marked sigmoid diverticulosis (no e/o acute diverticulitis). A 4mm LLL pulmonary nodule (no f/u needed), tiny cystocele and aortic atherosclerosis. Last year she reported that she no longer has any diarrhea or GI complaints, and was even back to eating dairy without any issues (no longer needing  Lactaid). Today she reports she is still doing well.   ***  Immunization History  Administered Date(s) Administered   Fluad Quad(high Dose 65+) 12/11/2018, 01/14/2020, 02/23/2021, 01/19/2022   Fluad Trivalent(High Dose 65+) 01/02/2023   Influenza Split 01/04/2012   Influenza, High Dose Seasonal PF 12/15/2013, 02/26/2015, 12/05/2017   PFIZER(Purple Top)SARS-COV-2 Vaccination 05/03/2019, 05/28/2019, 12/29/2019   Pfizer Covid-19 Vaccine Bivalent Booster 34yrs & up 05/20/2021   Pneumococcal Conjugate-13 06/04/2013   Pneumococcal Polysaccharide-23 09/17/2008, 12/11/2018   Td 10/20/2015   Tdap 05/07/2005   Zoster Recombinant(Shingrix) 12/20/2017, 09/08/2020   Last Pap smear: s/p hysterectomy   Last mammogram: 12/2022 at [facility] Last colonoscopy: 04/2021 Dr. Christella Hartigan, diverticulosis, benign biopsies. Last DEXA: 06/2021 T-1.8 at R femoral neck Joan Morales) Dentist: twice yearly for cleanings Ophtho:  yearly Exercise:  *** walking in the house 15 minutes (working back up since she was sick), 3x/week. No weight-bearing exercise. Has some hand weights at home.  Vitamin D screen: normal 09/2016, level of  36   Patient Care Team: Joselyn Arrow, MD as PCP - General (Family Medicine) Sheral Apley, MD as Attending Physician (Orthopedic Surgery) Netta Cedars, MD as Consulting Physician (Orthopedic Surgery) Sherrie George, MD as Consulting Physician (Ophthalmology) Rachael Fee, MD (Inactive) as Attending Physician (Gastroenterology) Dr. Geryl Councilman DDS as Consulting Physician (Dentistry) Ericson, Oretha Caprice, [place] Sallye Lat, MD as Consulting Physician (Ophthalmology) Bettey Costa, MD as Consulting Physician (Dermatology) Dr. Tonia Brooms, pulmonary  Depression Screening: Flowsheet Row Office Visit from 05/03/2022 in [facility]  PHQ-2 Total Score 0        Falls screen:     05/03/2022    9:53 AM 04/25/2021    2:36 PM 02/23/2021   11:18 AM 12/29/2019    9:58 AM  12/11/2018    9:59 AM  Fall Risk   Falls in the past year? 0 1 1 0 0  Number falls in past yr: 0 0 1    Injury with Fall? 0 1 1    Comment  fx R 5th metatarsal and tibial plateau fx R 5th metatarsal and L tibia    Risk for fall due to : No Fall Risks History of fall(s) History of fall(s)    Follow up Falls evaluation completed Falls evaluation completed Falls evaluation completed       Functional Status Survey:         End of Life Discussion:  Patient has a living will and medical power of attorney.  We do not have copies.   PMH, PSH, SH and FH were reviewed and updated     ROS: The patient denies anorexia, fever, headaches, decreased hearing, ear pain, sore throat, breast concerns, chest pain, palpitations, dizziness, syncope, dyspnea on exertion, swelling, nausea, vomiting, diarrhea, constipation, abdominal pain, melena, hematochezia, incontinence, dysuria, vaginal bleeding, discharge, odor or itch, genital lesions, numbness, tingling, weakness, tremor, suspicious skin lesions, depression,  anxiety, abnormal bleeding/bruising, or enlarged lymph nodes.   Bowels are normal. PND/allergies and cough ***   PHYSICAL EXAM:  There were no vitals taken for this visit.  Wt Readings from Last 3 Encounters:  02/23/23 157 lb 9.6 oz (71.5 kg)  01/02/23 157 lb (71.2 kg)  06/28/22 151 lb 9.6 oz (68.8 kg)  02/23/21 151# 6.4 oz (first visit for diarrhea) 12/2019 162# 12.8 oz   General Appearance:   Alert, cooperative, no distress, appears stated age.   Head:   Normocephalic, without obvious abnormality, atraumatic    Eyes:   PERRL, conjunctiva/corneas clear, EOM's intact, fundi not well visualized  Ears:   Normal TM's and external ear canals.   Nose:   No drainage, sinuses nontender  Throat:   Normal mucosa   Neck:   Supple, no lymphadenopathy; thyroid: no enlargement/ tenderness/nodules; no carotid bruit or JVD    Back:   Spine nontender, no curvature, ROM normal, no CVA tenderness   Lungs:   Clear to auscultation bilaterally without wheezes, rales or ronchi; respirations unlabored    Chest Wall:   No tenderness or deformity. Prominent manubrium, nontender  Heart:   Regular rate and rhythm, S1 and S2 normal, no murmur, rub   or gallop    Breast Exam:   No tenderness, masses, or nipple discharge or inversion. No axillary lymphadenopathy    Abdomen:   Soft, non-tender, nondistended, normoactive bowel sounds,   no masses, no hepatosplenomegaly    Genitalia:   Normal external genitalia without lesions, mild atrophic changes. BUS and vagina normal; bimanual exam normal--no pelvic masses appreciable, no tenderness. Pap not performed  ***   Rectal:   Normal sphincter tone, no masses. Heme negative stool ***  Extremities:   No clubbing, cyanosis or edema. Bilateral bunions, hammertoes and callouses. 2nd toes crossed/overlapping great toes bilaterally. Onychomycotic, thickened nails. Arthritic deformity to  2nd and 3rd DIP's in hands bilaterally. Unchanged from prior exams  Pulses:   1+ at LLE, 2+ elsewhere  Skin:   Skin color, texture, turgor normal, no rashes. Scattered cherry hemangiomas, some SK's. Purpura noted on R hand, amidst hyperpigmentation.***  Lymph nodes:   Cervical, supraclavicular, inguinal and axillary nodes normal    Neurologic:   Normal strength, sensation and gait; reflexes 2+ and symmetric throughout                           Psych:    Normal mood, affect, hygiene and grooming  ***UPDATE pulses, if pelvic/rectal done Update skin   ASSESSMENT/PLAN:  Please ask pt (and/or check [facility]) if she had any vaccines from the pharmacy. She was supposed to get RSV vaccine last Fall. Prevnar-20 today (if not gotten already) COVID booster also recommended--if declines, please decline in chart. If she wants, and we don't have any, advise to get from the pharmacy (wait 2 weeks from today's prevnar, if she gets it, vs doing both on the same day at pharmacy).  If not  having any pelvic complaints, we can skip pelvic and rectal exam this year (done last year).  Can leave on underwear or not change from the waist down. Does need breast exam.  Remind pt to bring in copies of living will and healthcare POA  DEXA due later this month (at [facility]), vs waiting until October and doing with her mammo. Advised to schedule (they should fax [name] order)  ***WAIT FOR LIPID RESULTS TO SEND CRESTOR RF  What are Dr.  Pulak Patel claims from Aug/Sept/Oct/Nov 2024 (once a month) (FM in [place] area)  Discussed monthly self breast exams and yearly mammograms; at least 30 minutes of aerobic activity at least 5 days/week, weight-bearing exercise at least 2x/wk; proper sunscreen use reviewed; healthy diet, including goals of calcium and vitamin D intake and alcohol recommendations (less than or equal to 1 drink/day) reviewed; regular seatbelt use; changing batteries in smoke detectors. Immunization recommendations discussed--continue yearly flu shots. Prevnar-20 ***  Discussed RSV vaccine, plan to get in the fall *** COVID booster discussed, not desired today *** Colon cancer screening is UTD DEXA due the end of the month, but can wait, if she prefers, and do along with mammo in October. Repeat vitamin D level next year if further decline in bone density.    MOST form reviewed and updated/signed.  Full code, full care, no prolonged IV fluids/tube feeds. Requested copy of living will and healthcare power of attorney.    Medicare Attestation I have personally reviewed: The patient's medical and social history Their use of alcohol, tobacco or illicit drugs Their current medications and supplements The patient's functional ability including ADLs,fall risks, home safety risks, cognitive, and hearing and visual impairment Diet and physical activities Evidence for depression or mood disorders  The patient's weight, height, BMI have been recorded in the chart.  I have made  referrals, counseling, and provided education to the patient based on review of the above and I have provided the patient with a written personalized care plan for preventive services.

## 2023-06-25 ENCOUNTER — Encounter: Payer: Self-pay | Admitting: Family Medicine

## 2023-06-25 ENCOUNTER — Ambulatory Visit: Payer: Medicare Other | Admitting: Family Medicine

## 2023-06-25 VITALS — BP 124/80 | HR 81 | Ht 68.0 in | Wt 160.0 lb

## 2023-06-25 DIAGNOSIS — M8580 Other specified disorders of bone density and structure, unspecified site: Secondary | ICD-10-CM | POA: Diagnosis not present

## 2023-06-25 DIAGNOSIS — E042 Nontoxic multinodular goiter: Secondary | ICD-10-CM | POA: Diagnosis not present

## 2023-06-25 DIAGNOSIS — E7800 Pure hypercholesterolemia, unspecified: Secondary | ICD-10-CM | POA: Diagnosis not present

## 2023-06-25 DIAGNOSIS — Z5181 Encounter for therapeutic drug level monitoring: Secondary | ICD-10-CM | POA: Diagnosis not present

## 2023-06-25 DIAGNOSIS — Z23 Encounter for immunization: Secondary | ICD-10-CM

## 2023-06-25 DIAGNOSIS — I700 Atherosclerosis of aorta: Secondary | ICD-10-CM | POA: Diagnosis not present

## 2023-06-25 DIAGNOSIS — Z0000 Encounter for general adult medical examination without abnormal findings: Secondary | ICD-10-CM

## 2023-06-25 LAB — LIPID PANEL

## 2023-06-26 ENCOUNTER — Encounter: Payer: Self-pay | Admitting: Family Medicine

## 2023-06-26 LAB — CMP14+EGFR
ALT: 17 IU/L (ref 0–32)
AST: 24 IU/L (ref 0–40)
Albumin: 4.5 g/dL (ref 3.7–4.7)
Alkaline Phosphatase: 70 IU/L (ref 44–121)
BUN/Creatinine Ratio: 17 (ref 12–28)
BUN: 14 mg/dL (ref 8–27)
Bilirubin Total: 0.4 mg/dL (ref 0.0–1.2)
CO2: 21 mmol/L (ref 20–29)
Calcium: 9.7 mg/dL (ref 8.7–10.3)
Chloride: 103 mmol/L (ref 96–106)
Creatinine, Ser: 0.83 mg/dL (ref 0.57–1.00)
Globulin, Total: 2.4 g/dL (ref 1.5–4.5)
Glucose: 90 mg/dL (ref 70–99)
Potassium: 4.4 mmol/L (ref 3.5–5.2)
Sodium: 140 mmol/L (ref 134–144)
Total Protein: 6.9 g/dL (ref 6.0–8.5)
eGFR: 71 mL/min/{1.73_m2} (ref >59)

## 2023-06-26 LAB — CBC WITH DIFFERENTIAL/PLATELET
Basophils Absolute: 0.1 10*3/uL (ref 0.0–0.2)
Basos: 1 %
EOS (ABSOLUTE): 0.1 10*3/uL (ref 0.0–0.4)
Eos: 2 %
Hematocrit: 39.5 % (ref 34.0–46.6)
Hemoglobin: 13 g/dL (ref 11.1–15.9)
Immature Grans (Abs): 0 10*3/uL (ref 0.0–0.1)
Immature Granulocytes: 0 %
Lymphocytes Absolute: 2.3 10*3/uL (ref 0.7–3.1)
Lymphs: 34 %
MCH: 30 pg (ref 26.6–33.0)
MCHC: 32.9 g/dL (ref 31.5–35.7)
MCV: 91 fL (ref 79–97)
Monocytes Absolute: 0.5 10*3/uL (ref 0.1–0.9)
Monocytes: 8 %
Neutrophils Absolute: 3.6 10*3/uL (ref 1.4–7.0)
Neutrophils: 55 %
Platelets: 240 10*3/uL (ref 150–450)
RBC: 4.33 x10E6/uL (ref 3.77–5.28)
RDW: 12.1 % (ref 11.7–15.4)
WBC: 6.6 10*3/uL (ref 3.4–10.8)

## 2023-06-26 LAB — LIPID PANEL
Cholesterol, Total: 169 mg/dL (ref 100–199)
HDL: 89 mg/dL (ref >39)
LDL CALC COMMENT:: 1.9 ratio (ref 0.0–4.4)
LDL Chol Calc (NIH): 68 mg/dL (ref 0–99)
Triglycerides: 62 mg/dL (ref 0–149)
VLDL Cholesterol Cal: 12 mg/dL (ref 5–40)

## 2023-06-26 LAB — TSH: TSH: 1.37 u[IU]/mL (ref 0.450–4.500)

## 2023-06-26 MED ORDER — ROSUVASTATIN CALCIUM 10 MG PO TABS: 10.0000 mg | ORAL_TABLET | Freq: Every day | ORAL | 3 refills | Status: AC

## 2023-07-16 ENCOUNTER — Ambulatory Visit
Admission: RE | Admit: 2023-07-16 | Discharge: 2023-07-16 | Disposition: A | Discharge status: Home / Self Care | Source: Ambulatory Visit | Attending: Family Medicine | Admitting: Family Medicine

## 2023-07-16 DIAGNOSIS — E042 Nontoxic multinodular goiter: Secondary | ICD-10-CM

## 2023-07-18 ENCOUNTER — Encounter: Payer: Self-pay | Admitting: Family Medicine

## 2023-09-26 ENCOUNTER — Ambulatory Visit (INDEPENDENT_AMBULATORY_CARE_PROVIDER_SITE_OTHER): Admitting: Family Medicine

## 2023-09-26 ENCOUNTER — Encounter: Payer: Self-pay | Admitting: Family Medicine

## 2023-09-26 ENCOUNTER — Ambulatory Visit: Payer: Self-pay

## 2023-09-26 VITALS — BP 136/60 | HR 82 | Ht 68.0 in | Wt 161.8 lb

## 2023-09-26 DIAGNOSIS — M545 Low back pain, unspecified: Secondary | ICD-10-CM

## 2023-09-26 MED ORDER — MELOXICAM 7.5 MG PO TABS: 7.5000 mg | ORAL_TABLET | Freq: Every day | ORAL | 0 refills | Status: DC

## 2023-09-26 NOTE — Patient Instructions (Signed)
 Try heat, massage and stretches a few times per day. You may use a salonpas patch with lidocaine  (or another brand of lidocaine  patch).. Stop taking ibuprofen. Instead, take meloxicam  once daily.  Take it once daily with food.  If it doesn't bother your stomach, and it isn't effective, you can increase the dose to 2 tablets, if needed. Do not take any ibuprofen (motrin/advil), or aleve (naproxen) or Goody or BC powder while you are taking the meloxicam . You can only use tylenol  (acetaminophen ) if needed for additional pain, while taking the meloxicam . You can use other topical medications (such as Biofreeze), if needed and helpful.  If your muscular pain is worse, and feels tight, we can add a muscle relaxant.  There didn't appear to be much spasm on today's exam. If your back pain isn't improving, especially if there is pain in the center (spine), then we should get an x-ray, since you did have a recent fall. Let us  know.

## 2023-09-26 NOTE — Telephone Encounter (Signed)
 FYI Only or Action Required?: Action required by provider: request for appointment.  Patient was last seen in primary care on 06/25/2023 by Randol Dawes, MD.  Called Nurse Triage reporting Back Pain.  Symptoms began several days ago.  Interventions attempted: OTC medications: Motrin.  Symptoms are: gradually worsening. Pain started Sunday. Motrin helps a little. Back is very stiff.  Triage Disposition: See Physician Within 24 Hours  Patient/caregiver understands and will follow disposition?: Yes    Copied from CRM [id number]. Topic: Clinical - Red Word Triage >> Sep 26, 2023  8:02 AM [place] R wrote: Red Word that prompted transfer to Nurse Triage: Patient states she thinks she strained her back and she has been in a lot of pain since Sunday. Has taken ibuprofen but thinks she needs a muscle relaxer. Answer Assessment - Initial Assessment Questions 1. ONSET: When did the pain begin?      Sunday 2. LOCATION: Where does it hurt? (upper, mid or lower back)     Low 3. SEVERITY: How bad is the pain?  (e.g., Scale 1-10; mild, moderate, or severe)   - MILD (1-3): Doesn't interfere with normal activities.    - MODERATE (4-7): Interferes with normal activities or awakens from sleep.    - SEVERE (8-10): Excruciating pain, unable to do any normal activities.      9 4. PATTERN: Is the pain constant? (e.g., yes, no; constant, intermittent)      Motrin helps a little 5. RADIATION: Does the pain shoot into your legs or somewhere else?     no 6. CAUSE:  What do you think is causing the back pain?      Over use 7. BACK OVERUSE:  Any recent lifting of heavy objects, strenuous work or exercise?     yes 8. MEDICINES: What have you taken so far for the pain? (e.g., nothing, acetaminophen , NSAIDS)     Motrin, heat pad 9. NEUROLOGIC SYMPTOMS: Do you have any weakness, numbness, or problems with bowel/bladder control?     no 10. OTHER SYMPTOMS: Do you have any other symptoms? (e.g.,  fever, abdomen pain, burning with urination, blood in urine)       no 11. PREGNANCY: Is there any chance you are pregnant? When was your last menstrual period?       no  Protocols used: Back Pain-A-AH

## 2023-09-26 NOTE — Progress Notes (Signed)
 Chief Complaint  Patient presents with   other     Lower Back pain started Sunday 6/7 no other symptoms, will leave urine sample unable to leave at start of visit, gave water-   She thinks she strained her back. She has been in a lot of pain since Sunday. She thinks overuse contributed.  6/30 she got pulled down by her son's dog while walking her.  She had been pulling very hard on the leash.  She had a little soreness on her right upper arm and her R forehead from the fall.  These felt better after a couple of days.  Pain in her back didn't start until 7/6.  She had 3 grandkids and the dog for 8 days--doing a lot more than usual--cooking, cleaning, laundry, walking the dog.  She has used heat and ibuprofen. Heat didn't help.  Ibuprofen helps (took 400 mg in the morning, 200 mg if needed other times of the day)., but pain recurs when it wears off. She thinks she may need a muscle relaxant. Pain is in her low back. She denies any radiation of the pain. She denies numbness, tingling, weakness, bowel/bladder changes. She denies urinary symptoms.  Pain currently 3-4/10 (ibuprofen was 3 hours ago).    PMH, PSH, SH reviewed  Normal Cr, GFR 71 in 06/2023  Outpatient Encounter Medications as of 09/26/2023  Medication Sig Note   albuterol  (VENTOLIN  HFA) 108 (90 Base) MCG/ACT inhaler Inhale 1-2 puffs into the lungs every 6 (six) hours as needed for wheezing or shortness of breath. 05/03/2022: As needed   Ascorbic Acid (VITAMIN C) 1000 MG tablet Take 1,000 mg by mouth daily.    Calcium  Carbonate (CALCIUM  600 PO) Take 1 tablet by mouth daily.    cyanocobalamin (VITAMIN B12) 1000 MCG tablet Take 1,000 mcg by mouth daily.    Glucosamine-Vit D-Hyaluron Acd (GLUCOSAMINE PLUS VITAMIN D3 PO) Take 1 tablet by mouth daily. 11/24/2021: 2000mg  glucoasamine and 2000iuVit D3   Omega-3 Fatty Acids (FISH OIL) 500 MG CAPS Take 1 capsule by mouth daily.    rosuvastatin  (CRESTOR ) 10 MG tablet Take 1 tablet (10 mg total)  by mouth daily.    vitamin E 180 MG (400 UNITS) capsule Take 400 Units by mouth daily.    No facility-administered encounter medications on file as of 09/26/2023.   Also taking OTC ibuprofen as stated in HPI  Allergies  Allergen Reactions   Erythromycin Swelling   Triple Antibiotic [Bacitracin -Neomycin-Polymyxin] Swelling    Blistering    ROS: no f/c, URI symptoms, CP, SOB, n/v. Some mild constipation this last week. No bleeding or bruising, no abdominal pain.   PHYSICAL EXAM:  BP 136/60   Pulse 82   Ht 5' 8 (1.727 m)   Wt 161 lb 12.8 oz (73.4 kg)   SpO2 98%   BMI 24.60 kg/m   Wt Readings from Last 3 Encounters:  09/26/23 161 lb 12.8 oz (73.4 kg)  06/25/23 160 lb (72.6 kg)  02/23/23 157 lb 9.6 oz (71.5 kg)   Pleasant, well-appearing female, in mild discomfort with position changes, related to back pain. Appears comfortable when seated still. HEENT: conjunctiva and sclera are clear, EOMI Neck: no lymphadenopathy or mass, no c-spine tenderness Lungs: clear bilaterally Heart: regular rate and rhythm Abdomen: soft, nontender. Back: No spinal or CVA tenderness No muscle spasm or any reproducible tenderness at her back. She points to the paraspinous muscles as where it hurts the most, and rubbing the area helps some. Neuro: alert and  oriented. Normal gait. Cranial nerves grossly intact. Normal strength, sensation. DTR's 2+ and symmetric. Negative SLR    ASSESSMENT/PLAN:  Acute bilateral low back pain without sciatica - mild muscular component, no true spasm. Course of meloxicam , heat, topicals prn. Consider robaxin if increased spasm, and XR if midline pain (d/t h/o fall) - Plan: meloxicam  (MOBIC ) 7.5 MG tablet, DISCONTINUED: meloxicam  (MOBIC ) 7.5 MG tablet, CANCELED: Urinalysis, microscopic only   I spent 34 minutes dedicated to the care of this patient, including pre-visit review of records, face to face time, post-visit ordering of testing and documentation.  Urine  sent for micro in error (instead of POC dip).  Will have lab cancel it. No urinary complaints and back pain seems muscular.   Try heat, massage and stretches a few times per day. You may use a salonpas patch with lidocaine  (or another brand of lidocaine  patch).. Stop taking ibuprofen. Instead, take meloxicam  once daily.  Take it once daily with food.  If it doesn't bother your stomach, and it isn't effective, you can increase the dose to 2 tablets, if needed. Do not take any ibuprofen (motrin/advil), or aleve (naproxen) or Goody or BC powder while you are taking the meloxicam . You can only use tylenol  (acetaminophen ) if needed for additional pain, while taking the meloxicam . You can use other topical medications (such as Biofreeze), if needed and helpful.  If your muscular pain is worse, and feels tight, we can add a muscle relaxant.  There didn't appear to be much spasm on today's exam. If your back pain isn't improving, especially if there is pain in the center (spine), then we should get an x-ray, since you did have a recent fall. Let us  know.

## 2023-09-27 ENCOUNTER — Ambulatory Visit: Payer: Self-pay | Admitting: Family Medicine

## 2023-09-27 LAB — URINALYSIS, MICROSCOPIC ONLY
Bacteria, UA: NONE SEEN
Casts: NONE SEEN /LPF
Epithelial Cells (non renal): NONE SEEN /HPF (ref 0–10)
RBC, Urine: NONE SEEN /HPF (ref 0–2)
WBC, UA: NONE SEEN /HPF (ref 0–5)

## 2023-10-01 MED ORDER — METHOCARBAMOL 500 MG PO TABS: 500.0000 mg | ORAL_TABLET | Freq: Three times a day (TID) | ORAL | 0 refills | Status: DC | PRN

## 2023-10-01 NOTE — Telephone Encounter (Signed)
 Copied from CRM [id number]. Topic: Clinical - Medical Advice >> Oct 01, 2023  8:16 AM [place] wrote: Reason for CRM: Patient still having back pain, wanting to know if she can get a muscle relaxer to help with the pain

## 2023-10-03 ENCOUNTER — Ambulatory Visit: Admitting: Family Medicine

## 2023-10-03 ENCOUNTER — Ambulatory Visit: Payer: Self-pay

## 2023-10-03 VITALS — BP 134/72 | HR 76 | Temp 98.2°F | Ht 68.0 in | Wt 161.6 lb

## 2023-10-03 DIAGNOSIS — R140 Abdominal distension (gaseous): Secondary | ICD-10-CM

## 2023-10-03 DIAGNOSIS — M545 Low back pain, unspecified: Secondary | ICD-10-CM | POA: Diagnosis not present

## 2023-10-03 DIAGNOSIS — R350 Frequency of micturition: Secondary | ICD-10-CM

## 2023-10-03 LAB — POCT URINALYSIS DIP (PROADVANTAGE DEVICE)
Bilirubin, UA: NEGATIVE
Blood, UA: NEGATIVE
Glucose, UA: NEGATIVE mg/dL
Ketones, POC UA: NEGATIVE mg/dL
Leukocytes, UA: NEGATIVE
Nitrite, UA: NEGATIVE
Protein Ur, POC: NEGATIVE mg/dL
Specific Gravity, Urine: 1.005
Urobilinogen, Ur: 0.2
pH, UA: 6 (ref 5.0–8.0)

## 2023-10-03 NOTE — Telephone Encounter (Signed)
 FYI Only or Action Required?: FYI only for provider.  Patient was last seen in primary care on 09/26/2023 by Randol Dawes, MD.  Called Nurse Triage reporting Urinary Frequency.  Symptoms began several days ago.  Interventions attempted: Nothing.  Symptoms are: unchanged.  Triage Disposition: See Physician Within 24 Hours  Patient/caregiver understands and will follow disposition?: yes             Reason for Disposition  Urinating more frequently than usual (i.e., frequency) OR new-onset of the feeling of an urgent need to urinate (i.e., urgency)  Answer Assessment - Initial Assessment Questions 1. SYMPTOM: What's the main symptom you're concerned about? (e.g., frequency, incontinence)     frequency 2. ONSET: When did the  frequency  start?     Unsure when it started  3. PAIN: Is there any pain? If Yes, ask: How bad is it? (Scale: 1-10; mild, moderate, severe)     no 4. CAUSE: What do you think is causing the symptoms?     UTI 5. OTHER SYMPTOMS: Do you have any other symptoms? (e.g., blood in urine, fever, flank pain, pain with urination)     Back pain  6. PREGNANCY: Is there any chance you are pregnant? When was your last menstrual period?     N/a  Protocols used: Urinary Symptoms-A-AH

## 2023-10-03 NOTE — Patient Instructions (Signed)
 Go to [facility] for Xrays We are also referring you to [facility] physical therapy.  You can stop the meloxicam  and the methocarbamol  if they don't seem to be helping. You can continue to use SalonPas patches with lidocaine . You can also use Biofreeze and/or Voltaren gel topically. Heat, gentle stretches and massage might also be helpful. Continue tylenol , if helping.  Your urine was very dilute, but normal. You may be voiding more frequently because you are drinking a lot of fluids. We are sending the urine off to further evaluate for any infection. Let us  know if things aren't improving, and definitely return if any worse--worsening bloating, abdominal pain, changes in bowels, fever, nausea, vomiting, numbness, tingling, etc.

## 2023-10-03 NOTE — Progress Notes (Unsigned)
 Chief Complaint  Patient presents with   Urinary Frequency    Patient is waking up 3 times per night to urinate. Also feels like her abdomen is bloated. Having regular BM's. Taking mobic  once daily and robaxin  every 8hrs and she does not feel like her back in feeling any better-tylenol  is helping her to be functional.    The last several nights (4-5) she has been getting up 3 times/night to void.  She might be drinking a little more liquids at night--drinking a lot of liquid when she takes the tylenol  at night.  She also has some increased frequency during the day, every couple of hours. She feels more bloated/distended across her abdomen. She denies constipation.  Last BM was this morning, normal.  Denies dysuria, doesn't feel like prior UTI.  Her back pain isn't much better. She is taking meloxicam  daily, and hasn't noticed any significant improvement from it.  She tried it at the 15 mg dose for the first 2 days, and now taking 7.5 mg daily. She started taking robaxin  on Monday, taking it TID. She has only noted minimal benefit. Pain is at R SI joint and muscles superior to this.   PMH, PSH, SH reviewed  Outpatient Encounter Medications as of 10/03/2023  Medication Sig Note   albuterol  (VENTOLIN  HFA) 108 (90 Base) MCG/ACT inhaler Inhale 1-2 puffs into the lungs every 6 (six) hours as needed for wheezing or shortness of breath. 10/03/2023: As needed, infrequently   Ascorbic Acid (VITAMIN C) 1000 MG tablet Take 1,000 mg by mouth daily.    Calcium  Carbonate (CALCIUM  600 PO) Take 1 tablet by mouth daily.    cyanocobalamin (VITAMIN B12) 1000 MCG tablet Take 1,000 mcg by mouth daily.    Glucosamine-Vit D-Hyaluron Acd (GLUCOSAMINE PLUS VITAMIN D3 PO) Take 1 tablet by mouth daily. 11/24/2021: 2000mg  glucoasamine and 2000iuVit D3   meloxicam  (MOBIC ) 7.5 MG tablet Take 1 tablet (7.5 mg total) by mouth daily. You may take 2 tablets a time if 1 isn't effective.  Take with food daily until your pain has  resolved    methocarbamol  (ROBAXIN ) 500 MG tablet Take 1-2 tablets (500-1,000 mg total) by mouth every 8 (eight) hours as needed.    Omega-3 Fatty Acids (FISH OIL) 500 MG CAPS Take 1 capsule by mouth daily.    rosuvastatin  (CRESTOR ) 10 MG tablet Take 1 tablet (10 mg total) by mouth daily.    vitamin E 180 MG (400 UNITS) capsule Take 400 Units by mouth daily.    No facility-administered encounter medications on file as of 10/03/2023.   Allergies  Allergen Reactions   Erythromycin Swelling   Triple Antibiotic [Bacitracin -Neomycin-Polymyxin] Swelling    Blistering    ROS: No f/c No n/v No dysuria, no hematuria. Nocturia and daytime frequency per HPI No vaginal discharge, odor, itch or bleeding. No numbness, tingling, weakness. No urinary or fecal incontinence Persistent LBP, now more on the right (had been bilateral). Moods good See HPI.    PHYSICAL EXAM:  BP 134/72   Pulse 76   Temp 98.2 F (36.8 C) (Tympanic)   Ht 5' 8 (1.727 m)   Wt 161 lb 9.6 oz (73.3 kg)   BMI 24.57 kg/m   Pleasant female, in no distress when seated. Some mild discomfort with standing up, moving positions. HEENT: conjunctiva and sclera are clear, EOMI Neck: no lymphadenopathy or mass Heart: regular rate and rhythm Lungs: clear bilateraly Abdomen: soft, nontender, no mass Back: no spinal tenderness.  Tender at R  SI and R lumbar paraspinous muscles and more laterally. No spasm appreciable Neuro: alert and oriented, cranial nerves grossly intact, normal gait.  Urine: SG 1.005, negative leuks, nitrite, protein, blood   ASSESSMENT/PLAN:  Urinary frequency - urine is dilute. Suspect related to increased intake, but will check for infection given abdominal complaints - Plan: POCT Urinalysis DIP (Proadvantage Device), Urinalysis, microscopic only, Urine Culture  Abdominal bloating - exam normal, ?SE from meds. Can stop meloxicam  if not really helping - Plan: Urinalysis, microscopic  only  Right-sided low back pain without sciatica, unspecified chronicity - check XR and refer for PT. Having pain at R SI joint and muscular pain, that hasn't really responded to NSAID or muscle relaxant - Plan: DG Lumbar Spine Complete, Ambulatory referral to Physical Therapy  Go to [facility] for Xrays We are also referring you to [facility] physical therapy.  You can stop the meloxicam  and the methocarbamol  if they don't seem to be helping. You can continue to use SalonPas patches with lidocaine . You can also use Biofreeze and/or Voltaren gel topically. Heat, gentle stretches and massage might also be helpful. Continue tylenol , if helping.  Your urine was very dilute, but normal. You may be voiding more frequently because you are drinking a lot of fluids. We are sending the urine off to further evaluate for any infection. Let us  know if things aren't improving, and definitely return if any worse--worsening bloating, abdominal pain, changes in bowels, fever, nausea, vomiting, numbness, tingling, etc.

## 2023-10-04 ENCOUNTER — Ambulatory Visit: Payer: Self-pay | Admitting: Family Medicine

## 2023-10-04 ENCOUNTER — Ambulatory Visit
Admission: RE | Admit: 2023-10-04 | Discharge: 2023-10-04 | Disposition: A | Discharge status: Home / Self Care | Source: Ambulatory Visit | Attending: Family Medicine | Admitting: Family Medicine

## 2023-10-04 DIAGNOSIS — M545 Low back pain, unspecified: Secondary | ICD-10-CM

## 2023-10-04 DIAGNOSIS — S32020A Wedge compression fracture of second lumbar vertebra, initial encounter for closed fracture: Secondary | ICD-10-CM | POA: Diagnosis not present

## 2023-10-04 LAB — URINALYSIS, MICROSCOPIC ONLY
Bacteria, UA: NONE SEEN
Casts: NONE SEEN /LPF
Epithelial Cells (non renal): NONE SEEN /HPF (ref 0–10)
RBC, Urine: NONE SEEN /HPF (ref 0–2)
WBC, UA: NONE SEEN /HPF (ref 0–5)

## 2023-10-05 ENCOUNTER — Encounter: Payer: Self-pay | Admitting: Family Medicine

## 2023-10-05 ENCOUNTER — Other Ambulatory Visit: Payer: Self-pay | Admitting: Family Medicine

## 2023-10-05 LAB — URINE CULTURE

## 2023-10-11 MED ORDER — METHOCARBAMOL 500 MG PO TABS: 500.0000 mg | ORAL_TABLET | Freq: Three times a day (TID) | ORAL | 0 refills | Status: DC | PRN

## 2023-10-22 ENCOUNTER — Ambulatory Visit: Payer: Self-pay

## 2023-10-22 NOTE — Telephone Encounter (Signed)
 Called pt - left message to return our call.     Abdominal bloating, frequent urination, unable to finish food- can not get in with gastro until sept. Would like to know if Dr Randol suggest anything

## 2023-10-22 NOTE — Progress Notes (Unsigned)
 No chief complaint on file.   Abdominal bloating, frequent urination, unable to finish food- can not get in with gastro until September.  Last seen with similar complaints on 7/16. At that time she reported been getting up 3 times/night to void for several nights.  She reported drinking a lot of liquid when she takes the tylenol  at night for her back pain.  She also has some increased frequency during the day, every couple of hours. She feels more bloated/distended across her abdomen. She denies constipation, had a normal bowel movement the morning of her visit.  Denies dysuria, doesn't feel like prior UTI.   She had also been seen for back pain which started 7/6. She didn't notice much improvement from meloxicam .  Robaxin  was added, helped some. Her pain was at R SI joint and muscles above this area, on the right. X-rays: IMPRESSION: 1. Mild L2 superior endplate compression deformity with approximately 25% loss of height. This is age indeterminate, but new from October chest CT. Consider lumbar MRI for further assessment. 2. Chronic mild L5 compression fracture. 3. Grade 1 anterolisthesis of L4 on L5. 4. Prominent diffuse facet hypertrophy. Mild multilevel degenerative disc disease.     Chart reviewed-- Had EGD and colonoscopy in 03/2021 with Dr. Norine gastritis, H.pylori negative.  Diverticulosis. Benign biopsies. Had CT abd/pelvis 05/2021: IMPRESSION: 1. No acute abnormality. No evidence of bowel obstruction or acute bowel inflammation. Marked sigmoid diverticulosis, with no evidence of acute diverticulitis. 2. Left lower lobe 4 mm solid pulmonary nodule. No follow-up needed if patient is low-risk.This recommendation follows the consensus statement: Guidelines for Management of Incidental Pulmonary Nodules Detected on CT Images: From the Fleischner Society 2017; Radiology 2017; 284:228-243. 3. No abdominopelvic lymphadenopathy. 4. Tiny cystocele. 5. Aortic  Atherosclerosis (ICD10-I70.0).    PMH, PSH, SH reviewed   ROS:    PHYSICAL EXAM:  There were no vitals taken for this visit.  Wt Readings from Last 3 Encounters:  10/03/23 161 lb 9.6 oz (73.3 kg)  09/26/23 161 lb 12.8 oz (73.4 kg)  06/25/23 160 lb (72.6 kg)       ASSESSMENT/PLAN:   Taking any ibuprofen or meloxicam ? Still having back pain? Did she get PT??  Did she schedule bone density test?   ?PPI and CT abdomen/pelvis

## 2023-10-22 NOTE — Telephone Encounter (Signed)
 FYI Only or Action Required?: Action required by provider: request for appointment.  Patient was last seen in primary care on 10/03/2023 by Randol Dawes, MD.  Called Nurse Triage reporting Urinary symptoms.  Symptoms began several weeks ago.  Interventions attempted: Nothing.  Symptoms are: unchanged.Prefers to see PCP.  Triage Disposition: See Physician Within 24 Hours  Patient/caregiver understands and will follow disposition?: Yes   Reason for Disposition  Urinating more frequently than usual (i.e., frequency) OR new-onset of the feeling of an urgent need to urinate (i.e., urgency)  Answer Assessment - Initial Assessment Questions 1. SYMPTOM: What's the main symptom you're concerned about? (e.g., frequency, incontinence)     Bloating, urinary frequency 2. ONSET: When did the    start?     Several weeks 3. PAIN: Is there any pain? If Yes, ask: How bad is it? (Scale: 1-10; mild, moderate, severe)     no 4. CAUSE: What do you think is causing the symptoms?     unsure 5. OTHER SYMPTOMS: Do you have any other symptoms? (e.g., blood in urine, fever, flank pain, pain with urination)     no 6. PREGNANCY: Is there any chance you are pregnant? When was your last menstrual period?     no  Protocols used: Urinary Symptoms-A-AH

## 2023-10-24 ENCOUNTER — Ambulatory Visit (INDEPENDENT_AMBULATORY_CARE_PROVIDER_SITE_OTHER): Admitting: Family Medicine

## 2023-10-24 ENCOUNTER — Ambulatory Visit: Payer: Self-pay | Admitting: Family Medicine

## 2023-10-24 ENCOUNTER — Encounter: Payer: Self-pay | Admitting: Family Medicine

## 2023-10-24 ENCOUNTER — Ambulatory Visit (HOSPITAL_BASED_OUTPATIENT_CLINIC_OR_DEPARTMENT_OTHER)
Admission: RE | Admit: 2023-10-24 | Discharge: 2023-10-24 | Disposition: A | Discharge status: Home / Self Care | Source: Ambulatory Visit | Attending: Family Medicine | Admitting: Family Medicine

## 2023-10-24 VITALS — BP 170/90 | HR 100 | Temp 99.0°F | Ht 68.0 in | Wt 156.0 lb

## 2023-10-24 DIAGNOSIS — R6881 Early satiety: Secondary | ICD-10-CM

## 2023-10-24 DIAGNOSIS — R509 Fever, unspecified: Secondary | ICD-10-CM

## 2023-10-24 DIAGNOSIS — R634 Abnormal weight loss: Secondary | ICD-10-CM

## 2023-10-24 DIAGNOSIS — K5792 Diverticulitis of intestine, part unspecified, without perforation or abscess without bleeding: Secondary | ICD-10-CM | POA: Diagnosis not present

## 2023-10-24 DIAGNOSIS — R140 Abdominal distension (gaseous): Secondary | ICD-10-CM | POA: Insufficient documentation

## 2023-10-24 DIAGNOSIS — N281 Cyst of kidney, acquired: Secondary | ICD-10-CM | POA: Diagnosis not present

## 2023-10-24 DIAGNOSIS — K5730 Diverticulosis of large intestine without perforation or abscess without bleeding: Secondary | ICD-10-CM | POA: Diagnosis not present

## 2023-10-24 DIAGNOSIS — K5790 Diverticulosis of intestine, part unspecified, without perforation or abscess without bleeding: Secondary | ICD-10-CM | POA: Diagnosis not present

## 2023-10-24 DIAGNOSIS — M545 Low back pain, unspecified: Secondary | ICD-10-CM | POA: Diagnosis not present

## 2023-10-24 LAB — POCT I-STAT CREATININE: Creatinine, Ser: 0.8 mg/dL (ref 0.44–1.00)

## 2023-10-24 MED ORDER — IOHEXOL 300 MG/ML  SOLN
100.0000 mL | Freq: Once | INTRAMUSCULAR | Status: AC | PRN
  Administered 2023-10-24: 85 mL via INTRAVENOUS

## 2023-10-24 NOTE — Patient Instructions (Signed)
 Try and stay well hydrated. If you can't eat much, have protein shakes to give you some nutrition. I recommend taking a medication for your stomach (since you have had some indigestion, and recently took anti-inflammatories, in case irritation of the stomach is contributing to your symptoms. Take the prescription omeprazole 40 mg once daily prior to dinner.  Take this once daily.  Try taking simethicone (Gas-X) to see if this helps with your bloating. Continue to try and eat a bland diet--avoid fried, greasy foods, avoid dairy temporarily.  Go to [facility] now for labs to be drawn, these will be sent off stat and should be back tonight.  Check your MyChart this evening.  We are going to order a CT scan to further evaluate your abdomen and pelvis.  Go to the Emergency Room if you spike a high fever, if you develop abdominal pain, vomiting, if you have any black or bloody stools, significant dizziness or fainting, or other changes or concerns.

## 2023-10-25 LAB — CBC WITH DIFFERENTIAL/PLATELET
Basophils Absolute: 0.1 x10E3/uL (ref 0.0–0.2)
Basos: 1 %
EOS (ABSOLUTE): 0 x10E3/uL (ref 0.0–0.4)
Eos: 0 %
Hematocrit: 41.6 % (ref 34.0–46.6)
Hemoglobin: 13.4 g/dL (ref 11.1–15.9)
Immature Grans (Abs): 0 x10E3/uL (ref 0.0–0.1)
Immature Granulocytes: 0 %
Lymphocytes Absolute: 1.8 x10E3/uL (ref 0.7–3.1)
Lymphs: 20 %
MCH: 30 pg (ref 26.6–33.0)
MCHC: 32.2 g/dL (ref 31.5–35.7)
MCV: 93 fL (ref 79–97)
Monocytes Absolute: 0.8 x10E3/uL (ref 0.1–0.9)
Monocytes: 8 %
Neutrophils Absolute: 6.3 x10E3/uL (ref 1.4–7.0)
Neutrophils: 71 %
Platelets: 306 x10E3/uL (ref 150–450)
RBC: 4.47 x10E6/uL (ref 3.77–5.28)
RDW: 12.4 % (ref 11.7–15.4)
WBC: 9 x10E3/uL (ref 3.4–10.8)

## 2023-10-25 LAB — COMPREHENSIVE METABOLIC PANEL WITH GFR
ALT: 9 IU/L (ref 0–32)
AST: 13 IU/L (ref 0–40)
Albumin: 4.7 g/dL (ref 3.7–4.7)
Alkaline Phosphatase: 115 IU/L (ref 44–121)
BUN/Creatinine Ratio: 24 (ref 12–28)
BUN: 18 mg/dL (ref 8–27)
Bilirubin Total: 0.3 mg/dL (ref 0.0–1.2)
CO2: 22 mmol/L (ref 20–29)
Calcium: 10.2 mg/dL (ref 8.7–10.3)
Chloride: 100 mmol/L (ref 96–106)
Creatinine, Ser: 0.76 mg/dL (ref 0.57–1.00)
Globulin, Total: 2.7 g/dL (ref 1.5–4.5)
Glucose: 101 mg/dL — AB (ref 70–99)
Potassium: 4.8 mmol/L (ref 3.5–5.2)
Sodium: 140 mmol/L (ref 134–144)
Total Protein: 7.4 g/dL (ref 6.0–8.5)
eGFR: 78 mL/min/1.73 (ref >59)

## 2023-10-25 LAB — SEDIMENTATION RATE: Sed Rate: 21 mm/h (ref 0–40)

## 2023-10-25 LAB — LIPASE: Lipase: 18 U/L (ref 14–85)

## 2023-10-29 ENCOUNTER — Encounter (HOSPITAL_BASED_OUTPATIENT_CLINIC_OR_DEPARTMENT_OTHER): Payer: Self-pay

## 2023-10-29 ENCOUNTER — Other Ambulatory Visit: Payer: Self-pay

## 2023-10-29 ENCOUNTER — Emergency Department (HOSPITAL_BASED_OUTPATIENT_CLINIC_OR_DEPARTMENT_OTHER)
Admission: EM | Admit: 2023-10-29 | Discharge: 2023-10-29 | Disposition: A | Discharge status: Home / Self Care | Attending: Emergency Medicine | Admitting: Emergency Medicine

## 2023-10-29 DIAGNOSIS — R630 Anorexia: Secondary | ICD-10-CM | POA: Diagnosis not present

## 2023-10-29 DIAGNOSIS — R140 Abdominal distension (gaseous): Secondary | ICD-10-CM | POA: Diagnosis not present

## 2023-10-29 DIAGNOSIS — R531 Weakness: Secondary | ICD-10-CM | POA: Diagnosis not present

## 2023-10-29 DIAGNOSIS — R5383 Other fatigue: Secondary | ICD-10-CM | POA: Insufficient documentation

## 2023-10-29 DIAGNOSIS — R109 Unspecified abdominal pain: Secondary | ICD-10-CM | POA: Insufficient documentation

## 2023-10-29 LAB — COMPREHENSIVE METABOLIC PANEL WITH GFR
ALT: 10 U/L (ref 0–44)
AST: 17 U/L (ref 15–41)
Albumin: 4.4 g/dL (ref 3.5–5.0)
Alkaline Phosphatase: 112 U/L (ref 38–126)
Anion gap: 16 — ABNORMAL HIGH (ref 5–15)
BUN: 15 mg/dL (ref 8–23)
CO2: 23 mmol/L (ref 22–32)
Calcium: 10.4 mg/dL — ABNORMAL HIGH (ref 8.9–10.3)
Chloride: 103 mmol/L (ref 98–111)
Creatinine, Ser: 0.86 mg/dL (ref 0.44–1.00)
GFR, Estimated: >60 mL/min (ref >60)
Glucose, Bld: 100 mg/dL — ABNORMAL HIGH (ref 70–99)
Potassium: 4.3 mmol/L (ref 3.5–5.1)
Sodium: 141 mmol/L (ref 135–145)
Total Bilirubin: 0.4 mg/dL (ref 0.0–1.2)
Total Protein: 7.5 g/dL (ref 6.5–8.1)

## 2023-10-29 LAB — CBC
HCT: 40.1 % (ref 36.0–46.0)
Hemoglobin: 13.2 g/dL (ref 12.0–15.0)
MCH: 29.4 pg (ref 26.0–34.0)
MCHC: 32.9 g/dL (ref 30.0–36.0)
MCV: 89.3 fL (ref 80.0–100.0)
Platelets: 292 K/uL (ref 150–400)
RBC: 4.49 MIL/uL (ref 3.87–5.11)
RDW: 13.5 % (ref 11.5–15.5)
WBC: 8.1 K/uL (ref 4.0–10.5)
nRBC: 0 % (ref 0.0–0.2)

## 2023-10-29 LAB — URINALYSIS, ROUTINE W REFLEX MICROSCOPIC
Bilirubin Urine: NEGATIVE
Glucose, UA: NEGATIVE mg/dL
Hgb urine dipstick: NEGATIVE
Ketones, ur: 15 mg/dL — AB
Leukocytes,Ua: NEGATIVE
Nitrite: NEGATIVE
Protein, ur: NEGATIVE mg/dL
Specific Gravity, Urine: 1.01 (ref 1.005–1.030)
pH: 6 (ref 5.0–8.0)

## 2023-10-29 LAB — LIPASE, BLOOD: Lipase: 16 U/L (ref 11–51)

## 2023-10-29 MED ORDER — OMEPRAZOLE 40 MG PO CPDR: 40.0000 mg | DELAYED_RELEASE_CAPSULE | Freq: Every day | ORAL | 0 refills | Status: AC

## 2023-10-29 MED ORDER — SIMETHICONE 125 MG PO CAPS
1.0000 | ORAL_CAPSULE | Freq: Three times a day (TID) | ORAL | 0 refills | Status: AC | PRN
Start: 2023-10-29 — End: ?

## 2023-10-29 NOTE — Discharge Instructions (Addendum)
 You have been seen and discharged from the emergency department.  Your blood work today was normal.  The CT scan that was done of your abdomen and pelvis was reviewed and did not show any concerning findings.  I have prescribed you Prilosec to take daily as well as Gas-X to take 3 times a day.  You may also follow the brat diet.  Follow-up with gastroenterology as scheduled.  Please add in Ensure shakes throughout the day.  Follow-up with your primary provider for further evaluation and further care. Take home medications as prescribed. If you have any worsening symptoms or further concerns for your health please return to an emergency department for further evaluation.

## 2023-10-29 NOTE — ED Triage Notes (Signed)
 Complaining of feeling weak for the last month or so. Can't eat well. Abdomen feels bloated and when it isnt it hurts. Has had a ct scan of pelvis.

## 2023-10-29 NOTE — ED Provider Notes (Signed)
 [facility] AT [facility] Provider Note   CSN: [id number] Arrival date & time: 10/29/23  1345     Patient presents with: Abdominal Pain   Joan Morales is a 83 y.o. female.   HPI   83 year old female presents emergency department complaining of ongoing generalized weakness and abdominal bloating.  This has been going on for the past month.  Was seen by her PCP, had screening blood work and an outpatient CT of the abdomen and pelvis.  This was reassuring.  She was supposed to start on omeprazole  and Gas-X but this was never initiated.  She has follow-up with gastroenterology in a couple weeks.  Presents today with ongoing symptoms.  Denies any other acute changes.  No fever, no genitourinary symptoms, no change or worsening abdominal pain.  States that she is able to eat breakfast without difficulty but then her appetite falls off later in the day.  States that she has had mild decrease in bowel movements with the decrease intake but denies any diarrhea, constipation.  Is still passing gas.  Prior to Admission medications   Medication Sig Start Date End Date Taking? Authorizing Provider  omeprazole  (PRILOSEC) 40 MG capsule Take 1 capsule (40 mg total) by mouth daily. 10/29/23  Yes Marlita Keil M, DO  Simethicone  (GAS-X EXTRA STRENGTH) 125 MG CAPS Take 1 capsule (125 mg total) by mouth 3 (three) times daily as needed. 10/29/23  Yes Keila Turan M, DO  albuterol  (VENTOLIN  HFA) 108 (90 Base) MCG/ACT inhaler Inhale 1-2 puffs into the lungs every 6 (six) hours as needed for wheezing or shortness of breath. Patient not taking: Reported on 10/24/2023 11/09/21   Randol Dawes, MD  Ascorbic Acid (VITAMIN C) 1000 MG tablet Take 1,000 mg by mouth daily.    [provider]  Calcium  Carbonate (CALCIUM  600 PO) Take 1 tablet by mouth daily. Patient not taking: Reported on 10/24/2023    [provider]  cyanocobalamin (VITAMIN B12) 1000 MCG tablet Take 1,000 mcg  by mouth daily.    [provider]  Glucosamine-Vit D-Hyaluron Acd (GLUCOSAMINE PLUS VITAMIN D3 PO) Take 1 tablet by mouth daily. Patient not taking: Reported on 10/24/2023    [provider]  meloxicam  (MOBIC ) 7.5 MG tablet Take 1 tablet (7.5 mg total) by mouth daily. You may take 2 tablets a time if 1 isn't effective.  Take with food daily until your pain has resolved Patient not taking: Reported on 10/24/2023 09/26/23   Randol Dawes, MD  methocarbamol  (ROBAXIN ) 500 MG tablet Take 1-2 tablets (500-1,000 mg total) by mouth every 8 (eight) hours as needed for muscle spasms. Patient not taking: Reported on 10/24/2023 10/11/23   Randol Dawes, MD  Omega-3 Fatty Acids (FISH OIL) 500 MG CAPS Take 1 capsule by mouth daily.    [provider]  rosuvastatin  (CRESTOR ) 10 MG tablet Take 1 tablet (10 mg total) by mouth daily. 06/26/23   Randol Dawes, MD  vitamin E 180 MG (400 UNITS) capsule Take 400 Units by mouth daily.    [provider]    Allergies: Erythromycin and Triple antibiotic [bacitracin -neomycin-polymyxin]    Review of Systems  Constitutional:  Positive for appetite change and fatigue. Negative for fever.  Respiratory:  Negative for shortness of breath.   Cardiovascular:  Negative for chest pain.  Gastrointestinal:  Negative for abdominal pain, diarrhea and vomiting.       + bloating  Skin:  Negative for rash.  Neurological:  Negative for headaches.  Updated Vital Signs BP (!) 157/70   Pulse 70   Temp 98.4 F (36.9 C) (Oral)   Resp 16   Ht 5' 8 (1.727 m)   Wt 70.3 kg   SpO2 100%   BMI 23.57 kg/m   Physical Exam Vitals and nursing note reviewed.  Constitutional:      General: She is not in acute distress.    Appearance: Normal appearance.  HENT:     Head: Normocephalic.     Mouth/Throat:     Mouth: Mucous membranes are moist.  Cardiovascular:     Rate and Rhythm: Normal rate.  Pulmonary:     Effort: Pulmonary effort is normal. No respiratory  distress.  Abdominal:     General: Bowel sounds are normal. There is no distension.     Palpations: Abdomen is soft.     Tenderness: There is no abdominal tenderness.  Skin:    General: Skin is warm.  Neurological:     Mental Status: She is alert and oriented to person, place, and time. Mental status is at baseline.  Psychiatric:        Mood and Affect: Mood normal.     (all labs ordered are listed, but only abnormal results are displayed) Labs Reviewed  COMPREHENSIVE METABOLIC PANEL WITH GFR - Abnormal; Notable for the following components:      Result Value   Glucose, Bld 100 (*)    Calcium  10.4 (*)    Anion gap 16 (*)    All other components within normal limits  URINALYSIS, ROUTINE W REFLEX MICROSCOPIC - Abnormal; Notable for the following components:   Ketones, ur 15 (*)    All other components within normal limits  LIPASE, BLOOD  CBC    EKG: None  Radiology: No results found.   Procedures   Medications Ordered in the ED - No data to display                                  Medical Decision Making Amount and/or Complexity of Data Reviewed Labs: ordered.  Risk OTC drugs. Prescription drug management.   83 year old female presents emergency department with ongoing generalized fatigue, abdominal bloating, decreased appetite.  Was seen by primary doctor with screening labs and an outpatient CT of the abdomen pelvis 5 days ago that was reassuring from a GI standpoint.  Presents today with ongoing symptoms, has follow-up with gastroenterology in a couple weeks.  Vitals are normal and stable.  Abdominal exam is benign.  She is overall very well-appearing.  Blood work today is normal and reassuring.  Do not feel she warrants a repeat emergent CT scan of the abdomen pelvis.  She was post to start on omeprazole , Gas-X but she did not.  I have sent these prescriptions, discussed using Ensure shakes throughout the day to ensure proper intake as well as following the  brat diet.  Patient understands and is agreeable to the above plan.  Patient at this time appears safe and stable for discharge and close outpatient follow up. Discharge plan and strict return to ED precautions discussed, patient verbalizes understanding and agreement.     Final diagnoses:  Bloating    ED Discharge Orders          Ordered    omeprazole  (PRILOSEC) 40 MG capsule  Daily        10/29/23 1816    Simethicone  (GAS-X EXTRA STRENGTH) 125 MG CAPS  3 times daily PRN        10/29/23 1816               Boy Delamater M, DO 10/29/23 1831

## 2023-11-16 ENCOUNTER — Encounter: Payer: Self-pay | Admitting: Gastroenterology

## 2023-11-16 ENCOUNTER — Ambulatory Visit (INDEPENDENT_AMBULATORY_CARE_PROVIDER_SITE_OTHER): Admitting: Gastroenterology

## 2023-11-16 VITALS — BP 128/74 | HR 74 | Ht 68.0 in | Wt 157.5 lb

## 2023-11-16 DIAGNOSIS — R140 Abdominal distension (gaseous): Secondary | ICD-10-CM

## 2023-11-16 NOTE — Progress Notes (Signed)
 Discussed the use of AI scribe software for clinical note transcription with the patient, who gave verbal consent to proceed.  HPI : Joan Morales is an 83 year old female who presents with abdominal bloating and distention.  Her symptoms were more severe earlier this year, with associated decreased appetite and weight loss.  The pain was more severe as well.  A CT was performed which was unremarkable  She experiences abdominal bloating and distention, describing her abdomen as feeling 'like I'm about three months pregnant.' The distention tends to reduce when she is lying flat. The bloating began recently and is associated with a sensation of pressure.  She has a history of stress-related symptoms, including loss of appetite and weight, which she attributes to recent family responsibilities and a fall. She recalls a similar episode in 2023, which was also associated with stress and family events.  She underwent an EGD, colonoscopy and CT at that time which were unremarkable.  She has been taking omeprazole  since October 29, 2023, but reports that it has not alleviated her symptoms.  She has been using Gas-X, which provides some relief.  Her diet includes many foods known to cause gas, such as broccoli, black-eyed peas, lima beans, pinto beans, green beans, and collard greens. She has been consuming [place] yogurt recently, which she believes may help with her symptoms.  No significant changes in bowel habits, reporting regular bowel movements without constipation or diarrhea. No recent antibiotic use since December.         CT abdomen/pelvis August 2025 Indication: Abdominal pain, acute, nonlocalized bloating, weight loss, early satiety, nausea IMPRESSION: 1. No acute intra-abdominal or pelvic abnormality. 2. Redemonstrated, moderate, age-indeterminate compression fracture of L2, favored to be late acute/early subacute. Nonemergent lumbar spine MRI could be considered to assess for  acuity. 3. Sigmoid colonic diverticulosis.   EGD February 2023 (Dr. Teressa) Indication: Unintentional weight loss, anorexia Mild antral gastritis, otherwise normal EGD  Colonoscopy February 2023 (Dr. Teressa)  Indication: Unintentional weight loss, loose stools, anorexia Left-sided diverticulosis, otherwise normal colonoscopy Biopsies taken to rule out microscopic colitis   CT abdomen/pelvis March 2023  Indication: Unintentional weight loss IMPRESSION: 1. No acute abnormality. No evidence of bowel obstruction or acute bowel inflammation. Marked sigmoid diverticulosis, with no evidence of acute diverticulitis. 2. Left lower lobe 4 mm solid pulmonary nodule. No follow-up needed if patient is low-risk.This recommendation follows the consensus statement: Guidelines for Management of Incidental Pulmonary Nodules Detected on CT Images: From the Fleischner Society 2017; Radiology 2017; [id number]. 3. No abdominopelvic lymphadenopathy. 4. Tiny cystocele. 5. Aortic Atherosclerosis (ICD10-I70.0)    Past Medical History:  Diagnosis Date   Allergic rhinitis, cause unspecified    Arthritis    hands (03/10/2014)   Cataract    GERD (gastroesophageal reflux disease)    Metatarsal fracture 12/30/2020   right 5th   Osteopenia    fosamax  started 01/2012   Ovarian teratoma    left, removed 12/06   PONV (postoperative nausea and vomiting)    Pure hypercholesterolemia    Shingles 03/2018   dx'd while in [facility], on right side of mouth, nose, lip   Tibial plateau fracture, left 12/30/2020     Past Surgical History:  Procedure Laterality Date   CATARACT EXTRACTION Left 11/2015   EYE SURGERY     FRACTURE SURGERY     GAS INSERTION Left 03/10/2014   Procedure: INSERTION OF [id number];  Surgeon: Norleen JONETTA Ku, MD;  Location: [facility] OR;  Service: Ophthalmology;  Laterality: Left;   LASER PHOTO ABLATION Left 03/10/2014   Procedure: LASER PHOTO ABLATION-HEADSCOPE LASER;  Surgeon: Norleen JONETTA Ku, MD;  Location: [facility] OR;  Service: Ophthalmology;  Laterality: Left;   SCLERAL BUCKLE Left 03/10/2014   SCLERAL BUCKLE WITH CRYO Left 03/10/2014   Procedure: SCLERAL BUCKLE WITH CRYO AND DIATHERMY;  Surgeon: Norleen JONETTA Ku, MD;  Location: [facility] OR;  Service: Ophthalmology;  Laterality: Left;   TONSILLECTOMY     TOTAL ABDOMINAL HYSTERECTOMY  02/2005   w/BSO; L ovarian teratoma   TOTAL ABDOMINAL HYSTERECTOMY W/ BILATERAL SALPINGOOPHORECTOMY  12/06   L ovarian teratoma   WRIST FRACTURE SURGERY Right 02/26/14   plate and screws   Family History  Problem Relation Age of Onset   Heart disease Mother    COPD Father        smoker   Fibromyalgia Sister    Irritable bowel syndrome Sister    Colitis Sister    Breast cancer Maternal Grandmother        50's   Asthma Paternal Grandmother    Colon cancer Paternal Aunt 23   Diabetes Paternal Aunt    Cancer - Colon Paternal Aunt 8   Diabetes Paternal Aunt    Esophageal cancer Neg Hx    Stomach cancer Neg Hx    Social History   Tobacco Use   Smoking status: Never   Smokeless tobacco: Never  Vaping Use   Vaping status: Never Used  Substance Use Topics   Alcohol use: No   Drug use: No   Current Outpatient Medications  Medication Sig Dispense Refill   albuterol  (VENTOLIN  HFA) 108 (90 Base) MCG/ACT inhaler Inhale 1-2 puffs into the lungs every 6 (six) hours as needed for wheezing or shortness of breath. (Patient not taking: Reported on 10/24/2023) 1 each 0   Ascorbic Acid (VITAMIN C) 1000 MG tablet Take 1,000 mg by mouth daily.     Calcium  Carbonate (CALCIUM  600 PO) Take 1 tablet by mouth daily. (Patient not taking: Reported on 10/24/2023)     cyanocobalamin (VITAMIN B12) 1000 MCG tablet Take 1,000 mcg by mouth daily.     Glucosamine-Vit D-Hyaluron Acd (GLUCOSAMINE PLUS VITAMIN D3 PO) Take 1 tablet by mouth daily. (Patient not taking: Reported on 10/24/2023)     meloxicam  (MOBIC ) 7.5 MG tablet Take 1 tablet (7.5 mg total) by mouth daily.  You may take 2 tablets a time if 1 isn't effective.  Take with food daily until your pain has resolved (Patient not taking: Reported on 10/24/2023) 20 tablet 0   methocarbamol  (ROBAXIN ) 500 MG tablet Take 1-2 tablets (500-1,000 mg total) by mouth every 8 (eight) hours as needed for muscle spasms. (Patient not taking: Reported on 10/24/2023) 15 tablet 0   Omega-3 Fatty Acids (FISH OIL) 500 MG CAPS Take 1 capsule by mouth daily.     omeprazole  (PRILOSEC) 40 MG capsule Take 1 capsule (40 mg total) by mouth daily. 30 capsule 0   rosuvastatin  (CRESTOR ) 10 MG tablet Take 1 tablet (10 mg total) by mouth daily. 90 tablet 3   Simethicone  (GAS-X EXTRA STRENGTH) 125 MG CAPS Take 1 capsule (125 mg total) by mouth 3 (three) times daily as needed. 28 capsule 0   vitamin E 180 MG (400 UNITS) capsule Take 400 Units by mouth daily.     No current facility-administered medications for this visit.   Allergies  Allergen Reactions   Erythromycin Swelling   Triple Antibiotic [Bacitracin -Neomycin-Polymyxin] Swelling    Blistering  Review of Systems: All systems reviewed and negative except where noted in HPI.    CT ABDOMEN PELVIS W CONTRAST Result Date: 10/24/2023 CLINICAL DATA:  Abdominal pain, acute, nonlocalized bloating, weight loss, early satiety, nausea EXAM: CT ABDOMEN AND PELVIS WITH CONTRAST TECHNIQUE: Multidetector CT imaging of the abdomen and pelvis was performed using the standard protocol following bolus administration of intravenous contrast. RADIATION DOSE REDUCTION: This exam was performed according to the departmental dose-optimization program which includes automated exposure control, adjustment of the mA and/or kV according to patient size and/or use of iterative reconstruction technique. CONTRAST:  85mL OMNIPAQUE  IOHEXOL  300 MG/ML  SOLN COMPARISON:  May 27, 2021 FINDINGS: Lower chest: No focal airspace consolidation or pleural effusion. Hepatobiliary: No mass.No radiopaque stones or wall  thickening of the gallbladder. No intrahepatic or extrahepatic biliary ductal dilation. The portal veins are patent. Pancreas: No mass or main ductal dilation. No peripancreatic inflammation or fluid collection. Spleen: Normal size. No mass. Adrenals/Urinary Tract: No adrenal masses. Multiple left peripelvic renal cysts. A couple of subcentimeter cortically based hypodensities are too small to definitively characterize, but also likely small cysts. No nephrolithiasis or hydronephrosis. Partially distended urinary bladder without visualized abnormality. Stomach/Bowel: The stomach is decompressed without focal abnormality. No small bowel wall thickening or inflammation. No small bowel obstruction.Normal appendix. Sigmoid colonic diverticulosis. No changes of acute diverticulitis. Vascular/Lymphatic: No aortic aneurysm. Diffuse aortoiliac atherosclerosis. No intraabdominal or pelvic lymphadenopathy. Reproductive: Hysterectomy. No concerning adnexal mass.No free pelvic fluid. Other: No pneumoperitoneum, ascites, or mesenteric inflammation. Musculoskeletal: Redemonstrated, moderate age-indeterminate compression fracture of L2. Mild L5 compression fracture is chronic. Mild dextrocurvature of the thoracolumbar spine. Multilevel degenerative disc disease of the spine. IMPRESSION: 1. No acute intra-abdominal or pelvic abnormality. 2. Redemonstrated, moderate, age-indeterminate compression fracture of L2, favored to be late acute/early subacute. Nonemergent lumbar spine MRI could be considered to assess for acuity. 3. Sigmoid colonic diverticulosis. Aortic Atherosclerosis (ICD10-I70.0). Electronically Signed   By: Rogelia Myers M.D.   On: 10/24/2023 17:43    Physical Exam: BP 128/74   Pulse 74   Ht 5' 8 (1.727 m)   Wt 157 lb 8 oz (71.4 kg)   SpO2 97%   BMI 23.95 kg/m  Constitutional: Pleasant,well-developed, Caucasian female in no acute distress. HEENT: Normocephalic and atraumatic. Conjunctivae are normal. No  scleral icterus. Neck supple.  Cardiovascular: Normal rate, regular rhythm.  Pulmonary/chest: Effort normal and breath sounds normal. No wheezing, rales or rhonchi. Abdominal: Soft, modest abdominal distention, nontender. Bowel sounds hyperactive throughout. There are no masses palpable. No hepatomegaly. Extremities: no edema Neurological: Alert and oriented to person place and time. Skin: Skin is warm and dry. No rashes noted. Psychiatric: Normal mood and affect. Behavior is normal.  CBC    Component Value Date/Time   WBC 8.1 10/29/2023 1409   RBC 4.49 10/29/2023 1409   HGB 13.2 10/29/2023 1409   HGB 13.4 10/24/2023 1521   HCT 40.1 10/29/2023 1409   HCT 41.6 10/24/2023 1521   PLT 292 10/29/2023 1409   PLT 306 10/24/2023 1521   MCV 89.3 10/29/2023 1409   MCV 93 10/24/2023 1521   MCH 29.4 10/29/2023 1409   MCHC 32.9 10/29/2023 1409   RDW 13.5 10/29/2023 1409   RDW 12.4 10/24/2023 1521   LYMPHSABS 1.8 10/24/2023 1521   MONOABS 0.6 11/04/2014 0001   EOSABS 0.0 10/24/2023 1521   BASOSABS 0.1 10/24/2023 1521    CMP     Component Value Date/Time   NA 141 10/29/2023 1409   NA  140 10/24/2023 1521   K 4.3 10/29/2023 1409   CL 103 10/29/2023 1409   CO2 23 10/29/2023 1409   GLUCOSE 100 (H) 10/29/2023 1409   BUN 15 10/29/2023 1409   BUN 18 10/24/2023 1521   CREATININE 0.86 10/29/2023 1409   CREATININE 0.65 11/04/2014 0001   CALCIUM  10.4 (H) 10/29/2023 1409   PROT 7.5 10/29/2023 1409   PROT 7.4 10/24/2023 1521   ALBUMIN 4.4 10/29/2023 1409   ALBUMIN 4.7 10/24/2023 1521   AST 17 10/29/2023 1409   ALT 10 10/29/2023 1409   ALKPHOS 112 10/29/2023 1409   BILITOT 0.4 10/29/2023 1409   BILITOT 0.3 10/24/2023 1521   GFRNONAA >60 10/29/2023 1409   GFRAA 88 12/29/2019 1156       Latest Ref Rng & Units 10/29/2023    2:09 PM 10/24/2023    3:21 PM 06/25/2023    4:22 PM  CBC EXTENDED  WBC 4.0 - 10.5 K/uL 8.1  9.0  6.6   RBC 3.87 - 5.11 MIL/uL 4.49  4.47  4.33   Hemoglobin 12.0 -  15.0 g/dL 86.7  86.5  86.9   HCT 36.0 - 46.0 % 40.1  41.6  39.5   Platelets 150 - 400 K/uL 292  306  240   NEUT# 1.4 - 7.0 x10E3/uL  6.3  3.6   Lymph# 0.7 - 3.1 x10E3/uL  1.8  2.3       ASSESSMENT AND PLAN:  83 year old female with bothersome abdominal bloating and distention, previously with more significant symptoms including nausea, anorexia, early satiety.  Those symptoms resolved, but the bloating and distention persist.  Normal bowel habits.    Abdominal bloating and distention Intermittent bloating and distention likely due to intestinal gas, possibly exacerbated by stress and diet. Omeprazole  may contribute to bloating. No significant anatomical causes identified. - Stop omeprazole . - Modify diet to reduce intake of gas-producing foods such as legumes, beans, and cruciferous vegetables. - Consider trial of probiotics, such as Align, for 4-6 weeks to assess for symptom improvement. - If symptoms worsen, consider noninvasive testing for bacterial overgrowth.  Low appetite associated with stress Low appetite linked to stress, improved with resolution of stressors.  Recording duration: 14 minutes        Randol Dawes, MD

## 2023-11-16 NOTE — Patient Instructions (Signed)
 Stop Omeprazole    Trial of probiotic align for 4-6 weeks   Eliminate beans, cruciferous vegetables (gas producing foods) for 4-6 weeks.   _______________________________________________________  If your blood pressure at your visit was 140/90 or greater, please contact your primary care physician to follow up on this.  _______________________________________________________  If you are age 82 or older, your body mass index should be between 23-30. Your Body mass index is 23.95 kg/m. If this is out of the aforementioned range listed, please consider follow up with your Primary Care Provider.  If you are age 28 or younger, your body mass index should be between 19-25. Your Body mass index is 23.95 kg/m. If this is out of the aformentioned range listed, please consider follow up with your Primary Care Provider.   ________________________________________________________  The [facility] providers would like to encourage you to use MYCHART to communicate with providers for non-urgent requests or questions.  Due to long hold times on the telephone, sending your provider a message by [facility] may be a faster and more efficient way to get a response.  Please allow 48 business hours for a response.  Please remember that this is for non-urgent requests.  _______________________________________________________  [facility] is using a team-based approach to care.  Your team is made up of your doctor and two to three APPS. Our APPS (Nurse Practitioners and Physician Assistants) work with your physician to ensure care continuity for you. They are fully qualified to address your health concerns and develop a treatment plan. They communicate directly with your gastroenterologist to care for you. Seeing the Advanced Practice Practitioners on your physician's team can help you by facilitating care more promptly, often allowing for earlier appointments, access to diagnostic testing, procedures, and  other specialty referrals.    It was a pleasure to see you today!  Thank you for trusting me with your gastrointestinal care!    Scott E.Stacia, MD

## 2024-01-07 DIAGNOSIS — M8589 Other specified disorders of bone density and structure, multiple sites: Secondary | ICD-10-CM | POA: Diagnosis not present

## 2024-01-07 DIAGNOSIS — M85832 Other specified disorders of bone density and structure, left forearm: Secondary | ICD-10-CM | POA: Diagnosis not present

## 2024-01-07 DIAGNOSIS — Z1231 Encounter for screening mammogram for malignant neoplasm of breast: Secondary | ICD-10-CM | POA: Diagnosis not present

## 2024-01-07 LAB — HM DEXA SCAN

## 2024-01-07 LAB — HM MAMMOGRAPHY

## 2024-01-08 ENCOUNTER — Encounter: Payer: Self-pay | Admitting: Family Medicine

## 2024-01-08 ENCOUNTER — Ambulatory Visit: Payer: Self-pay | Admitting: Family Medicine

## 2024-01-08 DIAGNOSIS — Z23 Encounter for immunization: Secondary | ICD-10-CM | POA: Diagnosis not present

## 2024-01-09 ENCOUNTER — Encounter: Payer: Self-pay | Admitting: Family Medicine

## 2024-02-22 ENCOUNTER — Encounter (INDEPENDENT_AMBULATORY_CARE_PROVIDER_SITE_OTHER): Payer: Medicare Other | Admitting: Ophthalmology

## 2024-02-22 DIAGNOSIS — H43813 Vitreous degeneration, bilateral: Secondary | ICD-10-CM | POA: Diagnosis not present

## 2024-02-22 DIAGNOSIS — H338 Other retinal detachments: Secondary | ICD-10-CM

## 2024-02-22 DIAGNOSIS — H35372 Puckering of macula, left eye: Secondary | ICD-10-CM | POA: Diagnosis not present

## 2024-02-22 DIAGNOSIS — H3581 Retinal edema: Secondary | ICD-10-CM

## 2024-02-22 DIAGNOSIS — H33301 Unspecified retinal break, right eye: Secondary | ICD-10-CM

## 2024-07-16 ENCOUNTER — Ambulatory Visit: Payer: Self-pay | Admitting: Family Medicine

## 2025-02-23 ENCOUNTER — Encounter (INDEPENDENT_AMBULATORY_CARE_PROVIDER_SITE_OTHER): Admitting: Ophthalmology
